# Patient Record
Sex: Female | Born: 1985 | Hispanic: Yes | Marital: Married | Smoking: Current every day smoker
Health system: Southern US, Community
[De-identification: ages and names within clinical notes are randomized; demographics above are authoritative.]

## PROBLEM LIST (undated history)

## (undated) ENCOUNTER — Inpatient Hospital Stay (HOSPITAL_COMMUNITY): Payer: Self-pay

## (undated) DIAGNOSIS — K760 Fatty (change of) liver, not elsewhere classified: Secondary | ICD-10-CM

## (undated) DIAGNOSIS — M25551 Pain in right hip: Secondary | ICD-10-CM

## (undated) DIAGNOSIS — K649 Unspecified hemorrhoids: Secondary | ICD-10-CM

## (undated) DIAGNOSIS — K219 Gastro-esophageal reflux disease without esophagitis: Secondary | ICD-10-CM

## (undated) DIAGNOSIS — N390 Urinary tract infection, site not specified: Secondary | ICD-10-CM

## (undated) DIAGNOSIS — F319 Bipolar disorder, unspecified: Secondary | ICD-10-CM

## (undated) DIAGNOSIS — I10 Essential (primary) hypertension: Secondary | ICD-10-CM

## (undated) DIAGNOSIS — J45909 Unspecified asthma, uncomplicated: Secondary | ICD-10-CM

## (undated) DIAGNOSIS — E669 Obesity, unspecified: Secondary | ICD-10-CM

## (undated) DIAGNOSIS — F4310 Post-traumatic stress disorder, unspecified: Secondary | ICD-10-CM

## (undated) DIAGNOSIS — Z720 Tobacco use: Secondary | ICD-10-CM

## (undated) DIAGNOSIS — R7303 Prediabetes: Secondary | ICD-10-CM

## (undated) DIAGNOSIS — R600 Localized edema: Secondary | ICD-10-CM

## (undated) DIAGNOSIS — G8929 Other chronic pain: Secondary | ICD-10-CM

## (undated) DIAGNOSIS — G4733 Obstructive sleep apnea (adult) (pediatric): Secondary | ICD-10-CM

## (undated) DIAGNOSIS — R002 Palpitations: Secondary | ICD-10-CM

## (undated) DIAGNOSIS — E785 Hyperlipidemia, unspecified: Secondary | ICD-10-CM

## (undated) DIAGNOSIS — F429 Obsessive-compulsive disorder, unspecified: Secondary | ICD-10-CM

## (undated) DIAGNOSIS — E559 Vitamin D deficiency, unspecified: Secondary | ICD-10-CM

## (undated) HISTORY — PX: NO PAST SURGERIES: SHX2092

## (undated) HISTORY — DX: Obsessive-compulsive disorder, unspecified: F42.9

## (undated) HISTORY — DX: Bipolar disorder, unspecified: F31.9

---

## 2004-02-06 ENCOUNTER — Inpatient Hospital Stay (HOSPITAL_COMMUNITY): Admission: AD | Admit: 2004-02-06 | Discharge: 2004-02-06 | Payer: Self-pay | Admitting: Family Medicine

## 2004-02-11 ENCOUNTER — Inpatient Hospital Stay (HOSPITAL_COMMUNITY): Admission: AD | Admit: 2004-02-11 | Discharge: 2004-02-11 | Payer: Self-pay | Admitting: *Deleted

## 2004-02-19 ENCOUNTER — Inpatient Hospital Stay (HOSPITAL_COMMUNITY): Admission: AD | Admit: 2004-02-19 | Discharge: 2004-02-27 | Payer: Self-pay | Admitting: Obstetrics & Gynecology

## 2004-03-22 ENCOUNTER — Emergency Department (HOSPITAL_COMMUNITY): Admission: EM | Admit: 2004-03-22 | Discharge: 2004-03-23 | Payer: Self-pay

## 2004-04-09 ENCOUNTER — Emergency Department (HOSPITAL_COMMUNITY): Admission: EM | Admit: 2004-04-09 | Discharge: 2004-04-09 | Payer: Self-pay

## 2004-09-24 ENCOUNTER — Emergency Department (HOSPITAL_COMMUNITY): Admission: EM | Admit: 2004-09-24 | Discharge: 2004-09-25 | Payer: Self-pay | Admitting: Emergency Medicine

## 2004-10-17 ENCOUNTER — Emergency Department (HOSPITAL_COMMUNITY): Admission: EM | Admit: 2004-10-17 | Discharge: 2004-10-17 | Payer: Self-pay | Admitting: Emergency Medicine

## 2004-11-05 ENCOUNTER — Emergency Department (HOSPITAL_COMMUNITY): Admission: EM | Admit: 2004-11-05 | Discharge: 2004-11-05 | Payer: Self-pay | Admitting: *Deleted

## 2005-02-11 ENCOUNTER — Emergency Department (HOSPITAL_COMMUNITY): Admission: EM | Admit: 2005-02-11 | Discharge: 2005-02-11 | Payer: Self-pay | Admitting: Emergency Medicine

## 2005-03-13 ENCOUNTER — Emergency Department (HOSPITAL_COMMUNITY): Admission: EM | Admit: 2005-03-13 | Discharge: 2005-03-13 | Payer: Self-pay | Admitting: Emergency Medicine

## 2005-03-18 ENCOUNTER — Emergency Department (HOSPITAL_COMMUNITY): Admission: EM | Admit: 2005-03-18 | Discharge: 2005-03-18 | Payer: Self-pay | Admitting: Emergency Medicine

## 2007-05-02 ENCOUNTER — Inpatient Hospital Stay (HOSPITAL_COMMUNITY): Admission: AD | Admit: 2007-05-02 | Discharge: 2007-05-02 | Payer: Self-pay | Admitting: Gynecology

## 2007-05-02 ENCOUNTER — Ambulatory Visit: Payer: Self-pay | Admitting: Obstetrics and Gynecology

## 2008-08-06 ENCOUNTER — Emergency Department (HOSPITAL_COMMUNITY): Admission: EM | Admit: 2008-08-06 | Discharge: 2008-08-07 | Payer: Self-pay | Admitting: Emergency Medicine

## 2010-07-28 ENCOUNTER — Emergency Department (HOSPITAL_COMMUNITY)
Admission: EM | Admit: 2010-07-28 | Discharge: 2010-07-28 | Payer: Self-pay | Source: Home / Self Care | Admitting: Emergency Medicine

## 2010-10-29 LAB — CBC
HCT: 37.6 % (ref 36.0–46.0)
Platelets: 225 10*3/uL (ref 150–400)
RDW: 12.6 % (ref 11.5–15.5)
WBC: 5.7 10*3/uL (ref 4.0–10.5)

## 2010-10-29 LAB — URINALYSIS, ROUTINE W REFLEX MICROSCOPIC
Hgb urine dipstick: NEGATIVE
Nitrite: NEGATIVE
Specific Gravity, Urine: 1.024 (ref 1.005–1.030)
Urobilinogen, UA: 0.2 mg/dL (ref 0.0–1.0)

## 2010-10-29 LAB — COMPREHENSIVE METABOLIC PANEL
ALT: 21 U/L (ref 0–35)
Albumin: 3.8 g/dL (ref 3.5–5.2)
Alkaline Phosphatase: 45 U/L (ref 39–117)
GFR calc Af Amer: >60 mL/min (ref >60)
Potassium: 4 mEq/L (ref 3.5–5.1)
Sodium: 137 mEq/L (ref 135–145)
Total Protein: 7.1 g/dL (ref 6.0–8.3)

## 2010-10-29 LAB — DIFFERENTIAL
Basophils Relative: 1 % (ref 0–1)
Eosinophils Absolute: 0.2 10*3/uL (ref 0.0–0.7)
Monocytes Absolute: 0.5 10*3/uL (ref 0.1–1.0)
Monocytes Relative: 9 % (ref 3–12)

## 2010-10-29 LAB — HEMOCCULT GUIAC POC 1CARD (OFFICE): Fecal Occult Bld: NEGATIVE

## 2011-01-04 NOTE — Discharge Summary (Signed)
NAME:  Jenny Baldwin, Jenny Baldwin                           ACCOUNT NO.:  [id number]   MEDICAL RECORD NO.:  [id number]                   PATIENT TYPE:  INP   LOCATION:  9111                                 FACILITY:  [facility]   PHYSICIAN:  Lesly Dukes, M.D.              DATE OF BIRTH:  11-Apr-1986   DATE OF ADMISSION:  02/19/2004  DATE OF DISCHARGE:                                 DISCHARGE SUMMARY   ADMISSION DIAGNOSES:  1. A 25 year old gravida 4 para 1-1-1-2 at 56 and 4 with right     costovertebral angle pain and fever.  2. Reassuring fetal heart tracing.  3. Recent admission 1 month prior with enterococcus cystitis with     noncompliant antibiotic course.   DISCHARGE DIAGNOSES:  1. A 25 year old gravida 4 para 2-1-1-3 postpartum day #2 status post     vaginal delivery.  2. Pyelonephritis, controlled.  3. Viable female with Apgars 5 at one minute and 9 at five minutes.   DISCHARGE MEDICATIONS:  1. Ibuprofen 600 mg p.o. q.6h. p.r.n. #30.  2. Levaquin 500 mg one p.o. q.a.m. at 8 a.m. x6 days.  3. Micronor one p.o. daily as directed, p.r.n. refills.  4. Prenatal vitamin one p.o. daily p.r.n. refills.   ADMISSION HISTORY:  Jenny Baldwin is a 25 year old who presented at 27 and 4  weeks with right CVA pain and fever.  She was admitted for suspected right  pyelonephritis.   HOSPITAL COURSE:  #1 - PYELONEPHRITIS.  The patient was admitted and placed  on IV Rocephin.  She continued to spike fevers for the next 2 days.  Her  urine culture grew enterococcus and she was changed to vancomycin.  She  quickly defervesced on vancomycin.  Urine culture sensitivities were to  Levaquin, vancomycin, ampicillin, and Macrobid.  The patient continued with  IV vancomycin due to the fact that her noncompliance was concerning.  Of  note, she was seen a month earlier and diagnosed with cystitis.  That  culture grew out enterococcus and the patient never filled her prescription  of antibiotics due to financial  reasons.  The patient remained afebrile on  vancomycin.  On the day of discharge the patient was transitioned to  Levaquin.  The plan is for neonatal protection, to take Levaquin at 8 a.m.  in the morning.  Pump throughout the rest of the day and discard the milk.  Breast feeding can then resume around 11 p.m. and then throughout the night  until the following morning when the cycle should be repeated.  This should  persist for 6 days until the antibiotics are completed.  This was discussed  with both lactation and pharmacy and both felt that that was reasonable.  The patient understands and agrees to be compliant.   #2 - SPONTANEOUS VAGINAL DELIVERY.  It was decided after a 6-day course of  vancomycin that the patient, being of full-term gestation,  be induced.  The  induction was begun with Cytotec.  The patient became favorable and was  begun on Pitocin.  Her labor was uncomplicated.  She delivered a viable girl  by spontaneous vaginal delivery with Apgars 5 at one minute and 9 at five  minutes.  She then had an uncomplicated postpartum course from the aspect of  her delivery.  Her pain was well controlled with ibuprofen.  She plans to  use Micronor for birth control and does plan to breast feed.   CONDITION ON DISCHARGE:  The patient was discharged to home in stable  condition.   INSTRUCTIONS GIVEN TO PATIENT:  The patient was told of the above medical  regimen and also the breastfeeding plan as stated above.  She is to follow  up at [facility] in 6 weeks and should return if she has fevers,  dysuria, or CVA tenderness.     Jon Gills, M.D.                     Lesly Dukes, M.D.    LC/MEDQ  D:  02/27/2004  T:  02/27/2004  Job:  [id number]

## 2011-01-08 ENCOUNTER — Emergency Department (HOSPITAL_COMMUNITY)
Admission: EM | Admit: 2011-01-08 | Discharge: 2011-01-08 | Discharge status: Against Medical Advice | Payer: Self-pay | Attending: Emergency Medicine | Admitting: Emergency Medicine

## 2011-01-08 ENCOUNTER — Emergency Department (HOSPITAL_COMMUNITY): Payer: Self-pay

## 2011-01-08 ENCOUNTER — Emergency Department (HOSPITAL_COMMUNITY)
Admission: EM | Admit: 2011-01-08 | Discharge: 2011-01-08 | Disposition: A | Discharge status: Home / Self Care | Payer: Self-pay | Attending: Emergency Medicine | Admitting: Emergency Medicine

## 2011-01-08 DIAGNOSIS — R300 Dysuria: Secondary | ICD-10-CM | POA: Insufficient documentation

## 2011-01-08 DIAGNOSIS — M545 Low back pain, unspecified: Secondary | ICD-10-CM | POA: Insufficient documentation

## 2011-01-08 DIAGNOSIS — M5430 Sciatica, unspecified side: Secondary | ICD-10-CM | POA: Insufficient documentation

## 2011-01-08 DIAGNOSIS — M79609 Pain in unspecified limb: Secondary | ICD-10-CM | POA: Insufficient documentation

## 2011-01-08 DIAGNOSIS — M25559 Pain in unspecified hip: Secondary | ICD-10-CM | POA: Insufficient documentation

## 2011-01-08 LAB — URINE MICROSCOPIC-ADD ON

## 2011-01-08 LAB — URINALYSIS, ROUTINE W REFLEX MICROSCOPIC
Bilirubin Urine: NEGATIVE
Hgb urine dipstick: NEGATIVE
Nitrite: NEGATIVE
Specific Gravity, Urine: 1.029 (ref 1.005–1.030)
pH: 7 (ref 5.0–8.0)

## 2011-01-08 LAB — POCT PREGNANCY, URINE: Preg Test, Ur: NEGATIVE

## 2011-01-09 LAB — URINE CULTURE: Colony Count: 15000

## 2011-04-13 ENCOUNTER — Emergency Department (HOSPITAL_COMMUNITY)
Admission: EM | Admit: 2011-04-13 | Discharge: 2011-04-13 | Disposition: A | Discharge status: Home / Self Care | Payer: Self-pay | Attending: Emergency Medicine | Admitting: Emergency Medicine

## 2011-04-13 DIAGNOSIS — M25569 Pain in unspecified knee: Secondary | ICD-10-CM | POA: Insufficient documentation

## 2011-05-24 LAB — RAPID URINE DRUG SCREEN, HOSP PERFORMED
Amphetamines: NOT DETECTED
Benzodiazepines: NOT DETECTED
Cocaine: NOT DETECTED
Opiates: NOT DETECTED
Tetrahydrocannabinol: NOT DETECTED

## 2011-05-24 LAB — URINALYSIS, ROUTINE W REFLEX MICROSCOPIC
Bilirubin Urine: NEGATIVE
Ketones, ur: NEGATIVE mg/dL
Nitrite: NEGATIVE
Protein, ur: NEGATIVE mg/dL
Urobilinogen, UA: 0.2 mg/dL (ref 0.0–1.0)

## 2011-05-24 LAB — BASIC METABOLIC PANEL
CO2: 19 mEq/L (ref 19–32)
Calcium: 8.7 mg/dL (ref 8.4–10.5)
GFR calc Af Amer: >60 mL/min (ref >60)
Glucose, Bld: 98 mg/dL (ref 70–99)
Potassium: 2.8 mEq/L — ABNORMAL LOW (ref 3.5–5.1)
Sodium: 138 mEq/L (ref 135–145)

## 2011-05-24 LAB — CBC
HCT: 42.6 % (ref 36.0–46.0)
Hemoglobin: 14.2 g/dL (ref 12.0–15.0)
MCHC: 33.4 g/dL (ref 30.0–36.0)
RBC: 4.36 MIL/uL (ref 3.87–5.11)
RDW: 12 % (ref 11.5–15.5)

## 2011-05-24 LAB — DIFFERENTIAL
Basophils Relative: 1 % (ref 0–1)
Eosinophils Relative: 3 % (ref 0–5)
Lymphocytes Relative: 36 % (ref 12–46)
Monocytes Absolute: 0.6 10*3/uL (ref 0.1–1.0)
Monocytes Relative: 6 % (ref 3–12)
Neutro Abs: 5.4 10*3/uL (ref 1.7–7.7)

## 2011-05-24 LAB — URINE MICROSCOPIC-ADD ON

## 2011-05-31 LAB — RAPID URINE DRUG SCREEN, HOSP PERFORMED
Benzodiazepines: NOT DETECTED
Cocaine: NOT DETECTED
Tetrahydrocannabinol: NOT DETECTED

## 2011-05-31 LAB — URINALYSIS, ROUTINE W REFLEX MICROSCOPIC
Ketones, ur: NEGATIVE
Nitrite: NEGATIVE
Protein, ur: NEGATIVE

## 2011-09-04 ENCOUNTER — Inpatient Hospital Stay (HOSPITAL_COMMUNITY): Payer: Medicaid Other

## 2011-09-04 ENCOUNTER — Inpatient Hospital Stay (HOSPITAL_COMMUNITY)
Admission: AD | Admit: 2011-09-04 | Discharge: 2011-09-04 | Disposition: A | Discharge status: Home / Self Care | Payer: Medicaid Other | Source: Ambulatory Visit | Attending: Obstetrics & Gynecology | Admitting: Obstetrics & Gynecology

## 2011-09-04 ENCOUNTER — Encounter (HOSPITAL_COMMUNITY): Payer: Self-pay | Admitting: *Deleted

## 2011-09-04 DIAGNOSIS — N949 Unspecified condition associated with female genital organs and menstrual cycle: Secondary | ICD-10-CM

## 2011-09-04 DIAGNOSIS — O26899 Other specified pregnancy related conditions, unspecified trimester: Secondary | ICD-10-CM

## 2011-09-04 DIAGNOSIS — R109 Unspecified abdominal pain: Secondary | ICD-10-CM

## 2011-09-04 DIAGNOSIS — O99891 Other specified diseases and conditions complicating pregnancy: Secondary | ICD-10-CM | POA: Insufficient documentation

## 2011-09-04 DIAGNOSIS — R1031 Right lower quadrant pain: Secondary | ICD-10-CM | POA: Insufficient documentation

## 2011-09-04 HISTORY — DX: Gastro-esophageal reflux disease without esophagitis: K21.9

## 2011-09-04 LAB — CBC
MCH: 32 pg (ref 26.0–34.0)
MCHC: 34.6 g/dL (ref 30.0–36.0)
MCV: 92.3 fL (ref 78.0–100.0)
Platelets: 254 10*3/uL (ref 150–400)
RBC: 4.13 MIL/uL (ref 3.87–5.11)
RDW: 12.4 % (ref 11.5–15.5)

## 2011-09-04 LAB — WET PREP, GENITAL: Clue Cells Wet Prep HPF POC: NONE SEEN

## 2011-09-04 LAB — URINE MICROSCOPIC-ADD ON

## 2011-09-04 LAB — URINALYSIS, ROUTINE W REFLEX MICROSCOPIC
Bilirubin Urine: NEGATIVE
Ketones, ur: NEGATIVE mg/dL
Nitrite: NEGATIVE
Protein, ur: NEGATIVE mg/dL
Urobilinogen, UA: 0.2 mg/dL (ref 0.0–1.0)

## 2011-09-04 NOTE — ED Provider Notes (Signed)
History   Pt presents today c/o RLQ pain that began earlier today. She states she had a positive home pregnancy test and she wants to make sure everything is "ok." She denies vag dc, bleeding, fever, or any other sx at this time. She states this is a planned pregnancy.  Chief Complaint  Patient presents with  . Abdominal Cramping   HPI  OB History    Grav Para Term Preterm Abortions TAB SAB Ect Mult Living   8 5 4 1 2  0 2 0 0 5      Past Medical History  Diagnosis Date  . Acid reflux     History reviewed. No pertinent past surgical history.  Family History  Problem Relation Age of Onset  . Anesthesia problems Neg Hx     History  Substance Use Topics  . Smoking status: Current Everyday Smoker -- 0.2 packs/day  . Smokeless tobacco: Never Used  . Alcohol Use: No    Allergies:  Allergies  Allergen Reactions  . Latex Itching  . Penicillins Swelling    Prescriptions prior to admission  Medication Sig Dispense Refill  . Prenatal Vit-Fe Fumarate-FA (PRENATAL MULTIVITAMIN) TABS Take 1 tablet by mouth daily. Has not picked them up yet, but plans on getting them as soon as she leaves today        Review of Systems  Constitutional: Negative for fever.  Eyes: Negative for blurred vision and double vision.  Cardiovascular: Negative for chest pain.  Gastrointestinal: Positive for abdominal pain. Negative for nausea, vomiting, diarrhea and constipation.  Genitourinary: Negative for dysuria, urgency, frequency and hematuria.  Neurological: Negative for dizziness and headaches.  Psychiatric/Behavioral: Negative for depression and suicidal ideas.   Physical Exam   Blood pressure 115/65, pulse 70, temperature 98.5 F (36.9 C), resp. rate 20, height 5\' 3"  (1.6 m), weight 160 lb (72.576 kg), last menstrual period 08/05/2011.  Physical Exam  Nursing note and vitals reviewed. Constitutional: She is oriented to person, place, and time. She appears well-developed and  well-nourished. No distress.  HENT:  Head: Normocephalic and atraumatic.  Eyes: EOM are normal. Pupils are equal, round, and reactive to light.  GI: Soft. She exhibits no distension. There is no tenderness. There is no rebound and no guarding.  Genitourinary: No bleeding around the vagina. Vaginal discharge found.       Cervix Lg/closed. Thin, greenish vag dc present. Uterus top-normal size. No adnexal masses.  Neurological: She is alert and oriented to person, place, and time.  Skin: Skin is warm and dry. She is not diaphoretic.  Psychiatric: She has a normal mood and affect. Her behavior is normal. Judgment and thought content normal.    MAU Course  Procedures  Wet prep and GC/Chlamydia cultures done.  Results for orders placed during the hospital encounter of 09/04/11 (from the past 24 hour(s))  URINALYSIS, ROUTINE W REFLEX MICROSCOPIC     Status: Abnormal   Collection Time   09/04/11  1:15 PM      Component Value Range   Color, Urine YELLOW  YELLOW    APPearance HAZY (*) CLEAR    Specific Gravity, Urine >1.030 (*) 1.005 - 1.030    pH 6.0  5.0 - 8.0    Glucose, UA NEGATIVE  NEGATIVE (mg/dL)   Hgb urine dipstick NEGATIVE  NEGATIVE    Bilirubin Urine NEGATIVE  NEGATIVE    Ketones, ur NEGATIVE  NEGATIVE (mg/dL)   Protein, ur NEGATIVE  NEGATIVE (mg/dL)   Urobilinogen, UA 0.2  0.0 - 1.0 (mg/dL)   Nitrite NEGATIVE  NEGATIVE    Leukocytes, UA SMALL (*) NEGATIVE   URINE MICROSCOPIC-ADD ON     Status: Abnormal   Collection Time   09/04/11  1:15 PM      Component Value Range   Squamous Epithelial / LPF MANY (*) RARE    WBC, UA 0-2  <3 (WBC/hpf)   Bacteria, UA FEW (*) RARE    Urine-Other MUCOUS PRESENT    POCT PREGNANCY, URINE     Status: Normal   Collection Time   09/04/11  1:23 PM      Component Value Range   Preg Test, Ur POSITIVE    CBC     Status: Normal   Collection Time   09/04/11  1:35 PM      Component Value Range   WBC 7.3  4.0 - 10.5 (K/uL)   RBC 4.13  3.87 - 5.11  (MIL/uL)   Hemoglobin 13.2  12.0 - 15.0 (g/dL)   HCT 04.5  40.9 - 81.1 (%)   MCV 92.3  78.0 - 100.0 (fL)   MCH 32.0  26.0 - 34.0 (pg)   MCHC 34.6  30.0 - 36.0 (g/dL)   RDW 91.4  78.2 - 95.6 (%)   Platelets 254  150 - 400 (K/uL)  HCG, QUANTITATIVE, PREGNANCY     Status: Abnormal   Collection Time   09/04/11  1:45 PM      Component Value Range   hCG, Beta Chain, Quant, S 759 (*) <5 (mIU/mL)  WET PREP, GENITAL     Status: Abnormal   Collection Time   09/04/11  1:49 PM      Component Value Range   Yeast, Wet Prep NONE SEEN  NONE SEEN    Trich, Wet Prep NONE SEEN  NONE SEEN    Clue Cells, Wet Prep NONE SEEN  NONE SEEN    WBC, Wet Prep HPF POC FEW (*) NONE SEEN    US shows small amount of free fluid. No IUP or adnexal masses noted. Assessment and Plan  Pain in preg: discussed with pt at length. She will return in 2 days for repeat B-quant. Discussed signs and sx of ectopic preg. Discussed diet, activity, risks, and precautions.  Clinton Gallant. Jeanne Diefendorf III, DrHSc, MPAS, PA-C  09/04/2011, 1:53 PM   Henrietta Hoover, PA 09/04/11 1507

## 2011-09-04 NOTE — Progress Notes (Signed)
Cramping started this Am, a little more on RLQ. No bleeding or change in vaginal discharge. Had + HPT X 2.

## 2011-09-04 NOTE — Progress Notes (Signed)
+  HPT x2, now cramping in RLQ.  Denies urinary s/s.

## 2011-09-04 NOTE — Progress Notes (Signed)
LMP 08/05/11

## 2011-09-05 LAB — GC/CHLAMYDIA PROBE AMP, GENITAL: Chlamydia, DNA Probe: NEGATIVE

## 2011-09-06 ENCOUNTER — Inpatient Hospital Stay (HOSPITAL_COMMUNITY)
Admission: AD | Admit: 2011-09-06 | Discharge: 2011-09-06 | Disposition: A | Discharge status: Home / Self Care | Payer: Self-pay | Source: Ambulatory Visit | Attending: Obstetrics & Gynecology | Admitting: Obstetrics & Gynecology

## 2011-09-06 DIAGNOSIS — O99891 Other specified diseases and conditions complicating pregnancy: Secondary | ICD-10-CM | POA: Insufficient documentation

## 2011-09-06 DIAGNOSIS — R102 Pelvic and perineal pain: Secondary | ICD-10-CM

## 2011-09-06 DIAGNOSIS — R1031 Right lower quadrant pain: Secondary | ICD-10-CM | POA: Insufficient documentation

## 2011-09-06 NOTE — ED Provider Notes (Signed)
History     Chief Complaint  Patient presents with  . Follow-up   HPI 26 y.o. [id number] at [redacted]w[redacted]d here for f/u quant. Seen in MAU on 1/16 with RLQ pain, HCG 759, no adnexal mass or IUP on u/s. Today pain continues, not severe, no bleeding.    Past Medical History  Diagnosis Date  . Acid reflux     No past surgical history on file.  Family History  Problem Relation Age of Onset  . Anesthesia problems Neg Hx     History  Substance Use Topics  . Smoking status: Current Everyday Smoker -- 0.2 packs/day  . Smokeless tobacco: Never Used  . Alcohol Use: No    Allergies:  Allergies  Allergen Reactions  . Latex Itching  . Penicillins Swelling    Prescriptions prior to admission  Medication Sig Dispense Refill  . Prenatal Vit-Fe Fumarate-FA (PRENATAL MULTIVITAMIN) TABS Take 1 tablet by mouth daily. Has not picked them up yet, but plans on getting them as soon as she leaves today        Review of Systems  Constitutional: Negative.   Respiratory: Negative.   Cardiovascular: Negative.   Gastrointestinal: Positive for abdominal pain. Negative for nausea, vomiting, diarrhea and constipation.  Genitourinary: Negative for dysuria, urgency, frequency, hematuria and flank pain.       Negative for vaginal bleeding, vaginal discharge  Musculoskeletal: Negative.   Neurological: Negative.   Psychiatric/Behavioral: Negative.    Physical Exam   Blood pressure 111/74, pulse 82, temperature 98.7 F (37.1 C), temperature source Oral, resp. rate 20, height 5' 2.5" (1.588 m), weight 160 lb 5 oz (72.717 kg), last menstrual period 08/05/2011, SpO2 98.00%.  Physical Exam  Nursing note and vitals reviewed. Constitutional: She is oriented to person, place, and time. She appears well-developed and well-nourished. No distress.  Cardiovascular: Normal rate.   Respiratory: Effort normal.  Musculoskeletal: Normal range of motion.  Neurological: She is alert and oriented to person, place, and  time.  Psychiatric: She has a normal mood and affect.    MAU Course  Procedures  Results for orders placed during the hospital encounter of 09/06/11 (from the past 24 hour(s))  HCG, QUANTITATIVE, PREGNANCY     Status: Abnormal   Collection Time   09/06/11  2:20 PM      Component Value Range   hCG, Beta Chain, Quant, S 1715 (*) <5 (mIU/mL)     Assessment and Plan  25 y.o. [id number] at [redacted]w[redacted]d with RLQ pain Appropriate rise in HCG Return 1 week for repeat u/s Precautions rev'd  FRAZIER,NATALIE 09/06/2011, 3:18 PM

## 2011-09-06 NOTE — Progress Notes (Signed)
Pt states " I'm here for a bhcg test today. I am still having pain in my right lower abdomen, but no bleeding."

## 2011-09-13 ENCOUNTER — Inpatient Hospital Stay (HOSPITAL_COMMUNITY)
Admission: AD | Admit: 2011-09-13 | Discharge: 2011-09-13 | Disposition: A | Discharge status: Home / Self Care | Payer: Self-pay | Source: Ambulatory Visit | Attending: Family Medicine | Admitting: Family Medicine

## 2011-09-13 ENCOUNTER — Inpatient Hospital Stay (HOSPITAL_COMMUNITY): Payer: Self-pay

## 2011-09-13 DIAGNOSIS — O99891 Other specified diseases and conditions complicating pregnancy: Secondary | ICD-10-CM | POA: Insufficient documentation

## 2011-09-13 DIAGNOSIS — R109 Unspecified abdominal pain: Secondary | ICD-10-CM | POA: Insufficient documentation

## 2011-09-13 DIAGNOSIS — O26899 Other specified pregnancy related conditions, unspecified trimester: Secondary | ICD-10-CM

## 2011-09-13 NOTE — ED Provider Notes (Signed)
Chart reviewed and agree with management and plan.  

## 2011-09-13 NOTE — ED Provider Notes (Signed)
History   Pt presents today for fetal viability [place]. She was originally seen for pain in pregnancy. She states she continues to have pain that comes and goes. She denies vag bleeding, fever, or any other problems at this time.  Chief Complaint  Patient presents with  . Follow-up   HPI  OB History    Grav Para Term Preterm Abortions TAB SAB Ect Mult Living   8 5 4 1 2  0 2 0 0 5      Past Medical History  Diagnosis Date  . Acid reflux     No past surgical history on file.  Family History  Problem Relation Age of Onset  . Anesthesia problems Neg Hx     History  Substance Use Topics  . Smoking status: Current Everyday Smoker -- 0.2 packs/day  . Smokeless tobacco: Never Used  . Alcohol Use: No    Allergies:  Allergies  Allergen Reactions  . Latex Itching  . Penicillins Swelling    Prescriptions prior to admission  Medication Sig Dispense Refill  . Prenatal Vit-Fe Fumarate-FA (PRENATAL MULTIVITAMIN) TABS Take 1 tablet by mouth daily. Has not picked them up yet, but plans on getting them as soon as she leaves today        Review of Systems  Constitutional: Negative for fever.  Eyes: Negative for blurred vision and double vision.  Cardiovascular: Negative for chest pain and palpitations.  Gastrointestinal: Positive for abdominal pain. Negative for nausea, vomiting, diarrhea, constipation and blood in stool.  Genitourinary: Negative for dysuria, urgency, frequency and hematuria.  Neurological: Negative for dizziness and headaches.  Psychiatric/Behavioral: Negative for depression and suicidal ideas.   Physical Exam   Blood pressure 123/80, pulse 86, temperature 98.5 F (36.9 C), temperature source Oral, resp. rate 20, height 5\' 2"  (1.575 m), weight 161 lb (73.029 kg), last menstrual period 08/05/2011, SpO2 98.00%.  Physical Exam  Nursing note and vitals reviewed. Constitutional: She is oriented to person, place, and time. She appears well-developed and  well-nourished. No distress.  HENT:  Head: Normocephalic and atraumatic.  Eyes: EOM are normal. Pupils are equal, round, and reactive to light.  GI: Soft. She exhibits no distension. There is no tenderness. There is no rebound and no guarding.  Neurological: She is alert and oriented to person, place, and time.  Skin: Skin is warm and dry. She is not diaphoretic.  Psychiatric: She has a normal mood and affect. Her behavior is normal. Judgment and thought content normal.    MAU Course  Procedures  US Ob Transvaginal  09/13/2011  *RADIOLOGY REPORT*  Clinical Data: Followup to determine if there is an intrauterine gestation.  TRANSVAGINAL OB ULTRASOUND  Technique:  Transvaginal ultrasound was performed for evaluation of the gestation as well as the maternal uterus and adnexal regions.  Comparison: 09/04/2011.  Findings:  Gestational sac with mean sac diameter of 1.27 cm consistent with the 6-week and 0-day gestational age and an estimated date of confinement of 05/08/2012.  A small yolk sac is noted consistent with intrauterine gestation. Fetal pole is not visualized possibly related to the early gestational age and can be assessed on follow-up.  Right-sided corpus luteum cyst measures up to 2 cm.  No free fluid.  IMPRESSION: Intrauterine gestation as indicated by the presence of a intrauterine gestational sac.  Fetal pole not visualized possibly because of early gestational age.  Please see above.  Original Report Authenticated By: Fuller Canada, M.D.     Assessment and Plan  IUP: discussed with pt at length. She is to begin prenatal care. Discussed diet, activity, risks, and precautions.  Clinton Gallant. Jayd Forrey III, DrHSc, MPAS, PA-C  09/13/2011, 4:39 PM   Henrietta Hoover, PA 09/13/11 1642

## 2011-09-13 NOTE — Progress Notes (Signed)
Pt states, "I'm here for a follow up U/S because they didn't see the baby before. I am still having crampy shooting pain in my right lower abdomen."

## 2011-11-25 ENCOUNTER — Inpatient Hospital Stay (HOSPITAL_COMMUNITY)
Admission: AD | Admit: 2011-11-25 | Discharge: 2011-11-25 | Disposition: A | Discharge status: Hospice | Payer: Medicaid Other | Source: Ambulatory Visit | Attending: Obstetrics and Gynecology | Admitting: Obstetrics and Gynecology

## 2011-11-25 ENCOUNTER — Encounter (HOSPITAL_COMMUNITY): Payer: Self-pay | Admitting: *Deleted

## 2011-11-25 DIAGNOSIS — R109 Unspecified abdominal pain: Secondary | ICD-10-CM | POA: Insufficient documentation

## 2011-11-25 DIAGNOSIS — R300 Dysuria: Secondary | ICD-10-CM | POA: Insufficient documentation

## 2011-11-25 DIAGNOSIS — N949 Unspecified condition associated with female genital organs and menstrual cycle: Secondary | ICD-10-CM

## 2011-11-25 LAB — URINALYSIS, ROUTINE W REFLEX MICROSCOPIC
Leukocytes, UA: NEGATIVE
Nitrite: NEGATIVE
Specific Gravity, Urine: <1.005 — ABNORMAL LOW (ref 1.005–1.030)
Urobilinogen, UA: 0.2 mg/dL (ref 0.0–1.0)
pH: 5.5 (ref 5.0–8.0)

## 2011-11-25 LAB — WET PREP, GENITAL: Yeast Wet Prep HPF POC: NONE SEEN

## 2011-11-25 NOTE — MAU Provider Note (Signed)
History     CSN: [id number]  Arrival date and time: 11/25/11 4098   First Provider Initiated Contact with Patient 11/25/11 1917      No chief complaint on file.  HPI 26 y.o. [id number] at [redacted]w[redacted]d c/o pressure/pain in low abdomen and with urination. Pain worse with movement and position changes. No vaginal bleeding, + discharge. Also c/o sore in vaginal area, thinks she cut herself shaving.    Past Medical History  Diagnosis Date  . Acid reflux     History reviewed. No pertinent past surgical history.  Family History  Problem Relation Age of Onset  . Anesthesia problems Neg Hx   . Diabetes Mother   . Diabetes Maternal Aunt   . Diabetes Maternal Uncle     History  Substance Use Topics  . Smoking status: Former Smoker -- 1.0 packs/day for 14 years    Types: Cigarettes    Quit date: 11/08/2011  . Smokeless tobacco: Never Used  . Alcohol Use: No    Allergies:  Allergies  Allergen Reactions  . Latex Itching  . Penicillins Swelling    Prescriptions prior to admission  Medication Sig Dispense Refill  . acetaminophen (TYLENOL) 500 MG tablet Take 500 mg by mouth every 6 (six) hours as needed. For tooth ache      . Prenatal Vit-Fe Fumarate-FA (PRENATAL MULTIVITAMIN) TABS Take 1 tablet by mouth daily. Has not picked them up yet, but plans on getting them as soon as she leaves today        Review of Systems  Constitutional: Negative.   Respiratory: Negative.   Cardiovascular: Negative.   Gastrointestinal: Positive for abdominal pain. Negative for nausea, vomiting, diarrhea and constipation.  Genitourinary: Negative for dysuria, urgency, frequency, hematuria and flank pain.       Negative for vaginal bleeding, cramping/contractions  Musculoskeletal: Negative.   Neurological: Negative.   Psychiatric/Behavioral: Negative.    Physical Exam   Last menstrual period 08/05/2011.  Physical Exam  Nursing note and vitals reviewed. Constitutional: She is oriented to person,  place, and time. She appears well-developed and well-nourished. No distress.  HENT:  Head: Normocephalic and atraumatic.  Cardiovascular: Normal rate.   Respiratory: Effort normal.  GI: Soft. Bowel sounds are normal. She exhibits no mass. There is no tenderness. There is no rebound and no guarding.  Genitourinary:    There is lesion on the right labia. There is no rash on the right labia. There is no rash or lesion on the left labia. Uterus is not tender. Enlarged: Size c/w dates. Cervix exhibits no motion tenderness, no discharge and no friability. Right adnexum displays no mass, no tenderness and no fullness. Left adnexum displays no mass, no tenderness and no fullness. No tenderness or bleeding around the vagina. No vaginal discharge found.  Musculoskeletal: Normal range of motion.  Neurological: She is alert and oriented to person, place, and time.  Skin: Skin is warm and dry.  Psychiatric: She has a normal mood and affect.    MAU Course  Procedures Results for orders placed during the hospital encounter of 11/25/11 (from the past 72 hour(s))  URINALYSIS, ROUTINE W REFLEX MICROSCOPIC     Status: Abnormal   Collection Time   11/25/11  7:00 PM      Component Value Range Comment   Color, Urine YELLOW  YELLOW     APPearance CLEAR  CLEAR     Specific Gravity, Urine <1.005 (*) 1.005 - 1.030     pH 5.5  5.0 -  8.0     Glucose, UA NEGATIVE  NEGATIVE (mg/dL)    Hgb urine dipstick NEGATIVE  NEGATIVE     Bilirubin Urine NEGATIVE  NEGATIVE     Ketones, ur NEGATIVE  NEGATIVE (mg/dL)    Protein, ur NEGATIVE  NEGATIVE (mg/dL)    Urobilinogen, UA 0.2  0.0 - 1.0 (mg/dL)    Nitrite NEGATIVE  NEGATIVE     Leukocytes, UA NEGATIVE  NEGATIVE  MICROSCOPIC NOT DONE ON URINES WITH NEGATIVE PROTEIN, BLOOD, LEUKOCYTES, NITRITE, OR GLUCOSE <1000 mg/dL.  GC/CHLAMYDIA PROBE AMP, GENITAL     Status: Normal   Collection Time   11/25/11  7:25 PM      Component Value Range Comment   GC Probe Amp, Genital  NEGATIVE  NEGATIVE     Chlamydia, DNA Probe NEGATIVE  NEGATIVE    WET PREP, GENITAL     Status: Abnormal   Collection Time   11/25/11  7:25 PM      Component Value Range Comment   Yeast Wet Prep HPF POC NONE SEEN  NONE SEEN     Trich, Wet Prep NONE SEEN  NONE SEEN     Clue Cells Wet Prep HPF POC NONE SEEN  NONE SEEN     WBC, Wet Prep HPF POC FEW (*) NONE SEEN  MANY BACTERIA SEEN  HERPES SIMPLEX VIRUS CULTURE     Status: Normal (Preliminary result)   Collection Time   11/25/11  7:25 PM      Component Value Range Comment   Specimen Description VAGINA      Special Requests NONE      Culture Culture has been initiated.      Report Status PENDING        Assessment and Plan  26 y.o. [id number] at [redacted]w[redacted]d  Low abd pain c/w round ligament pain - rev'd comfort measures and precautions HSV culture pending F/U for prenatal care as soon as possible - awaiting medicaid Emmerson Shuffield 11/25/2011, 7:57 PM

## 2011-11-25 NOTE — MAU Note (Signed)
C/o pain and pressure in low abd since yesterday and pressure and pain when she urinates.

## 2011-11-26 LAB — GC/CHLAMYDIA PROBE AMP, GENITAL: Chlamydia, DNA Probe: NEGATIVE

## 2011-11-27 ENCOUNTER — Encounter (HOSPITAL_COMMUNITY): Payer: Self-pay | Admitting: Advanced Practice Midwife

## 2011-11-27 LAB — HERPES SIMPLEX VIRUS CULTURE

## 2011-11-27 NOTE — MAU Provider Note (Signed)
Agree with above note.  Jenny Baldwin 11/27/2011 8:42 AM

## 2011-12-25 LAB — OB RESULTS CONSOLE RPR: RPR: NONREACTIVE

## 2011-12-25 LAB — OB RESULTS CONSOLE ANTIBODY SCREEN: Antibody Screen: NEGATIVE

## 2012-01-25 ENCOUNTER — Inpatient Hospital Stay (HOSPITAL_COMMUNITY)
Admission: AD | Admit: 2012-01-25 | Discharge: 2012-01-25 | Disposition: A | Discharge status: Home / Self Care | Payer: Medicaid Other | Source: Ambulatory Visit | Attending: Obstetrics & Gynecology | Admitting: Obstetrics & Gynecology

## 2012-01-25 ENCOUNTER — Encounter (HOSPITAL_COMMUNITY): Payer: Self-pay

## 2012-01-25 DIAGNOSIS — W19XXXA Unspecified fall, initial encounter: Secondary | ICD-10-CM

## 2012-01-25 DIAGNOSIS — R109 Unspecified abdominal pain: Secondary | ICD-10-CM | POA: Insufficient documentation

## 2012-01-25 DIAGNOSIS — Z3490 Encounter for supervision of normal pregnancy, unspecified, unspecified trimester: Secondary | ICD-10-CM

## 2012-01-25 DIAGNOSIS — O99891 Other specified diseases and conditions complicating pregnancy: Secondary | ICD-10-CM | POA: Insufficient documentation

## 2012-01-25 DIAGNOSIS — W010XXA Fall on same level from slipping, tripping and stumbling without subsequent striking against object, initial encounter: Secondary | ICD-10-CM | POA: Insufficient documentation

## 2012-01-25 LAB — URINALYSIS, ROUTINE W REFLEX MICROSCOPIC
Bilirubin Urine: NEGATIVE
Ketones, ur: NEGATIVE mg/dL
Leukocytes, UA: NEGATIVE
Nitrite: NEGATIVE
Specific Gravity, Urine: 1.02 (ref 1.005–1.030)
Urobilinogen, UA: 0.2 mg/dL (ref 0.0–1.0)
pH: 7.5 (ref 5.0–8.0)

## 2012-01-25 NOTE — MAU Note (Signed)
Patient is in with c/o sharp and cramping lower abdominal pain after slipping and falling face forward into the pool about an hour ago. She states that she was about to step into the pool when she slipped and fell. She denies any vaginal bleeding. She has no prenatal care. She reports good fetal movement.

## 2012-01-25 NOTE — Discharge Instructions (Signed)
Prenatal Care  WHAT IS PRENATAL CARE?  Prenatal care means health care during your pregnancy, before your baby is born. Take care of yourself and your baby by:   Getting early prenatal care. If you know you are pregnant, or think you might be pregnant, call your caregiver as soon as possible. Schedule a visit for a general/prenatal examination.   Getting regular prenatal care. Follow your caregiver's schedule for blood and other necessary tests. Do not miss appointments.   Do everything you can to keep yourself and your baby healthy during your pregnancy.   Prenatal care should include evaluation of medical, dietary, educational, psychological, and social needs for the couple and the medical, surgical, and genetic history of the family of the mother and father.   Discuss with your caregiver:   Your medicines, prescription, over-the-counter, and herbal medicines.   Substance abuse, alcohol, smoking, and illegal drugs.   Domestic abuse and violence, if present.   Your immunizations.   Nutrition and diet.   Exercising.   Environment and occupational hazards, at home and at work.   History of sexually transmitted disease, both you and your partner.   Previous pregnancies.  WHY IS PRENATAL CARE SO IMPORTANT?  By seeing you regularly, your caregiver has the chance to find problems early, so that they can be treated as soon as possible. Other problems might be prevented. Many studies have shown that early and regular prenatal care is important for the health of both mothers and their babies.  I AM THINKING ABOUT GETTING PREGNANT. HOW CAN I TAKE CARE OF MYSELF?  Taking care of yourself before you get pregnant helps you to have a healthy pregnancy. It also lowers your chances of having a baby born with a birth defect. Here are ways to take care of yourself before you get pregnant:   Eat healthy foods, exercise regularly (30 minutes per day for most days of the week is best), and get enough  rest and sleep. Talk to your caregiver about what kinds of foods and exercises are best for you.   Take 400 micrograms (mcg) of folic acid (one of the B vitamins) every day. The best way to do this is to take a daily multivitamin pill that contains this amount of folic acid. Getting enough of the synthetic (manufactured) form of folic acid every day before you get pregnant and during early pregnancy can help prevent certain birth defects. Many breakfast cereals and other grain products have folic acid added to them, but only certain cereals contain 400 mcg of folic acid per serving. Check the label on your multivitamin or cereal to find the amount of folic acid in the food.   See your caregiver for a complete check up before getting pregnant. Make sure that you have had all your immunization shots, especially for rubella (German measles). Rubella can cause serious birth defects. Chickenpox is another illness you want to avoid during pregnancy. If you have had chickenpox and rubella in the past, you should be immune to them.   Tell your caregiver about any prescription or non-prescription medicines (including herbal remedies) you are taking. Some medicines are not safe to take during pregnancy.   Stop smoking cigarettes, drinking alcohol, or taking illegal drugs. Ask your caregiver for help, if you need it. You can also get help with alcohol and drugs by talking with a member of your faith community, a counselor, or a trusted friend.   Discuss and treat any medical, social, or psychological   problems before getting pregnant.   Discuss any history of genetic problems in the mother, father, and their families. Do genetic testing before getting pregnant, when possible.   Discuss any physical or emotional abuse with your caregiver.   Discuss with your caregiver if you might be exposed to harmful chemicals on your job or where you live.   Discuss with your caregiver if you think your job or the hours you  work may be harmful and should be changed.   The father should be involved with the decision making and with all aspects of the pregnancy, labor, and delivery.   If you have medical insurance, make sure you are covered for pregnancy.  I JUST FOUND OUT THAT I AM PREGNANT. HOW CAN I TAKE CARE OF MYSELF?  Here are ways to take care of yourself and the precious new life growing inside you:   Continue taking your multivitamin with 400 micrograms (mcg) of folic acid every day.   Get early and regular prenatal care. It does not matter if this is your first pregnancy or if you already have children. It is very important to see a caregiver during your pregnancy. Your caregiver will check at each visit to make sure that you and the baby are healthy. If there are any problems, action can be taken right away to help you and the baby.   Eat a healthy diet that includes:   Fruits.   Vegetables.   Foods low in saturated fat.   Grains.   Calcium-rich foods.   Drink 6 to 8 glasses of liquids a day.   Unless your caregiver tells you not to, try to be physically active for 30 minutes, most days of the week. If you are pressed for time, you can get your activity in through 10 minute segments, three times a day.   If you smoke, drink alcohol, or use drugs, STOP. These can cause long-term damage to your baby. Talk with your caregiver about steps to take to stop smoking. Talk with a member of your faith community, a counselor, a trusted friend, or your caregiver if you are concerned about your alcohol or drug use.   Ask your caregiver before taking any medicine, even over-the-counter medicines. Some medicines are not safe to take during pregnancy.   Get plenty of rest and sleep.   Avoid hot tubs and saunas during pregnancy.   Do not have X-rays taken, unless absolutely necessary and with the recommendation of your caregiver. A lead shield can be placed on your abdomen, to protect the baby when X-rays  are taken in other parts of the body.   Do not empty the cat litter when you are pregnant. It may contain a parasite that causes an infection called toxoplasmosis, which can cause birth defects. Also, use gloves when working in garden areas used by cats.   Do not eat uncooked or undercooked cheese, meats, or fish.   Stay away from toxic chemicals like:   Insecticides.   Solvents (some cleaners or paint thinners).   Lead.   Mercury.   Sexual relations may continue until the end of the pregnancy, unless you have a medical problem or there is a problem with the pregnancy and your caregiver tells you not to.   Do not wear high heel shoes, especially during the second half of the pregnancy. You can lose your balance and fall.   Do not take long trips, unless absolutely necessary. Be sure to see your caregiver before   going on the trip.   Do not sit in one position for more than 2 hours, when on a trip.   Take a copy of your medical records when going on a trip.   Know where there is a hospital in the city you are visiting, in case of an emergency.   Most dangerous household products will have pregnancy warnings on their labels. Ask your caregiver about products if you are unsure.   Limit or eliminate your caffeine intake from coffee, tea, sodas, medicines, and chocolate.   Many women continue working through pregnancy. Staying active might help you stay healthier. If you have a question about the safety or the hours you work at your particular job, talk with your caregiver.   Get informed:   Read books.   Watch videos.   Go to childbirth classes for you and the father.   Talk with experienced moms.   Ask your caregiver about childbirth education classes for you and your partner. Classes can help you and your partner prepare for the birth of your baby.   Ask about a pediatrician (baby doctor) and methods and pain medicine for labor, delivery, and possible Cesarean delivery  (C-section).  I AM NOT THINKING ABOUT GETTING PREGNANT RIGHT NOW, BUT HEARD THAT ALL WOMEN SHOULD TAKE FOLIC ACID EVERY DAY?  All women of childbearing age, with even a remote chance of getting pregnant, should try to make sure they get enough folic acid. Many pregnancies are not planned. Many women do not know they are actually pregnant early in their pregnancies, and certain birth defects happen in the very early part of pregnancy. Taking 400 micrograms (mcg) of folic acid every day will help prevent certain birth defects that happen in the early part of pregnancy. If a woman begins taking vitamin pills in the second or third month of pregnancy, it may be too late to prevent birth defects. Folic acid may also have other health benefits for women, besides preventing birth defects.  HOW OFTEN SHOULD I SEE MY CAREGIVER DURING PREGNANCY?  Your caregiver will give you a schedule for your prenatal visits. You will have visits more often as you get closer to the end of your pregnancy. An average pregnancy lasts about 40 weeks.  A typical schedule includes visiting your caregiver:   About once each month, during your first 6 months of pregnancy.   Every 2 weeks, during the next 2 months.   Weekly in the last month, until the delivery date.  Your caregiver will probably want to see you more often if:  You are over 35.   Your pregnancy is high risk, because you have certain health problems or problems with the pregnancy, such as:   Diabetes.   High blood pressure.   The baby is not growing on schedule, according to the dates of the pregnancy.  Your caregiver will do special tests, to make sure you and the baby are not having any serious problems. WHAT HAPPENS DURING PRENATAL VISITS?   At your first prenatal visit, your caregiver will talk to you about you and your partner's health history and your family's health history, and will do a physical exam.   On your first visit, a physical exam will  include checks of your blood pressure, height and weight, and an exam of your pelvic organs. Your caregiver will do a Pap test if you have not had one recently, and will do cultures of your cervix to make sure there is no   infection.   At each visit, there will be tests of your blood, urine, blood pressure, weight, and checking the progress of the baby.   Your caregiver will be able to tell you when to expect that your baby will be born.   Each visit is also a chance for you to learn about staying healthy during pregnancy and for asking questions.   Discuss whether you will be breastfeeding.   At your later prenatal visits, your caregiver will check how you are doing and how the baby is developing. You may have a number of tests done as your pregnancy progresses.   Ultrasound exams are often used to check on the baby's growth and health.   You may have more urine and blood tests, as well as special tests, if needed. These may include amniocentesis (examine fluid in the pregnancy sac), stress tests (check how baby responds to contractions), biophysical profile (measures fetus well-being). Your caregiver will explain the tests and why they are necessary.  I AM IN MY LATE THIRTIES, AND I WANT TO HAVE A CHILD NOW. SHOULD I DO ANYTHING SPECIAL?  As you get older, there is more chance of having a medical problem (high blood pressure), pregnancy problem (preeclampsia, problems with the placenta), miscarriage, or a baby born with a birth defect. However, most women in their late thirties and early forties have healthy babies. See your caregiver on a regular basis before you get pregnant and be sure to go for exams throughout your pregnancy. Your caregiver probably will want to do some special tests to check on you and your baby's health when you are pregnant.  Women today are often delaying having children until later in life, when they are in their thirties and forties. While many women in their thirties  and forties have no difficulty getting pregnant, fertility does decline with age. For women over 40 who cannot get pregnant after 6 months of trying, it is recommended that they see their caregiver for a fertility evaluation. It is not uncommon to have trouble becoming pregnant or experience infertility (inability to become pregnant after trying for one year). If you think that you or your partner may be infertile, you can discuss this with your caregiver. He or she can recommend treatments such as drugs, surgery, or assisted reproductive technology.  Document Released: 08/08/2003 Document Revised: 07/25/2011 Document Reviewed: 07/05/2009 [facility] Patient Information 2012 [place], [place].  ------------------------------   Return to MAU or call your provider if you experience more than five contractions per hour, abdominal pain worsens, you bleed from your vagina, or you have loss of fluid.

## 2012-01-25 NOTE — MAU Note (Signed)
Pt states, " I slipped at the pool and went in stomach first. Since then I've had pain in my abdomen that is worse on the right  Side."

## 2012-01-25 NOTE — MAU Provider Note (Signed)
History     CSN: [id number]  Arrival date and time: 01/25/12 9528   First Provider Initiated Contact with Patient 01/25/12 2137      Chief Complaint  Patient presents with  . Fall  . Abdominal Pain   HPI  Slipped while getting into pool this afternoon.  Was standing on the edge of the pool and fell in, hitting the water with her stomach on the right side.  Since then, she has had abdominal pain primarily on her right site.  Pain is described as both shooting and cramping, constant, and has been gradually improving with time.  No contractions, no bleeding from vagina, no loss of fluid.  Baby is active as normal, is moving at time of history.  OB History    Grav Para Term Preterm Abortions TAB SAB Ect Mult Living   8 5 4 1 2  0 2 0 0 5      Past Medical History  Diagnosis Date  . Acid reflux     History reviewed. No pertinent past surgical history.  Family History  Problem Relation Age of Onset  . Anesthesia problems Neg Hx   . Diabetes Mother   . Diabetes Maternal Aunt   . Diabetes Maternal Uncle     History  Substance Use Topics  . Smoking status: Former Smoker -- 1.0 packs/day for 14 years    Types: Cigarettes    Quit date: 11/08/2011  . Smokeless tobacco: Never Used  . Alcohol Use: No    Allergies:  Allergies  Allergen Reactions  . Latex Itching  . Penicillins Swelling  . Pineapple Itching    Itching around face & mouth    Prescriptions prior to admission  Medication Sig Dispense Refill  . Omega-3 Fatty Acids (OMEGA 3 PO) Take 1 capsule by mouth daily.      . Prenatal Vit-Fe Fumarate-FA (PRENATAL MULTIVITAMIN) TABS Take 1 tablet by mouth daily. Has not picked them up yet, but plans on getting them as soon as she leaves today        Review of Systems  Constitutional: Negative for fever, chills and diaphoresis.  HENT: Negative for congestion, sore throat and tinnitus.   Eyes: Negative for blurred vision, double vision and pain.  Respiratory:  Negative for cough and shortness of breath.   Cardiovascular: Negative for chest pain and palpitations.  Gastrointestinal: Positive for heartburn and abdominal pain (See HPI). Negative for nausea, vomiting, diarrhea, constipation, blood in stool and melena.  Genitourinary: Negative for dysuria and hematuria.  Musculoskeletal: Positive for back pain (Back pain chronically through this pregnancy.) and falls (See HPI).  Skin: Negative for rash.  Neurological: Negative for dizziness, tingling, sensory change, speech change, focal weakness, seizures, loss of consciousness and headaches.  Endo/Heme/Allergies: Does not bruise/bleed easily.  Psychiatric/Behavioral: Negative for depression and hallucinations. The patient is not nervous/anxious.    Physical Exam   Blood pressure 117/62, pulse 91, temperature 98.6 F (37 C), temperature source Oral, resp. rate 20, height 5\' 2"  (1.575 m), weight 71.895 kg (158 lb 8 oz), last menstrual period 08/05/2011.  Physical Exam  Constitutional: She is oriented to person, place, and time. She appears well-developed and well-nourished. No distress.  HENT:  Head: Normocephalic and atraumatic.  Eyes: Conjunctivae and EOM are normal. Right eye exhibits no discharge. Left eye exhibits no discharge. No scleral icterus.  Neck: No tracheal deviation present.  Cardiovascular: Normal rate, regular rhythm and normal heart sounds.   Respiratory: Effort normal and breath sounds normal.  No stridor. No respiratory distress. She has no wheezes.  GI: Soft. Bowel sounds are normal. She exhibits no distension. There is no tenderness. There is no rebound and no guarding.       Gravid  Musculoskeletal: She exhibits no edema.  Neurological: She is alert and oriented to person, place, and time.  Skin: Skin is warm and dry. She is not diaphoretic.  Psychiatric: She has a normal mood and affect. Her behavior is normal. Judgment and thought content normal.   Uterus palpates  appropriate for gestational age. Cervix closed, posterior, thick, firm  MAU Course  Procedures  Continuous electronic fetal monitoring:  Fetal heart rate 140, accelerations present, no decels Toco: No contractions  Assessment and Plan  26 yo female at [redacted]w[redacted]d fell into pool, hit abdomen on right side.   History and exam not concerning.  Electronic fetal monitoring reassuring for a little over two hours.  Patient and family have outside commitment and would like to leave.  Given very low energy mechanism of injury, improvement of pain over the course of the day, and reassuring monitoring thus far, appropriate for discharge home.  Clancy Gourd 01/25/2012, 9:38 PM

## 2012-01-26 NOTE — MAU Provider Note (Signed)
I was present for the exam and agree with above.  [place], CNM 01/26/2012 3:16 AM

## 2012-01-31 ENCOUNTER — Ambulatory Visit (HOSPITAL_COMMUNITY)
Admission: RE | Admit: 2012-01-31 | Discharge: 2012-01-31 | Disposition: A | Discharge status: Home / Self Care | Payer: Medicaid Other | Source: Ambulatory Visit | Attending: Family Medicine | Admitting: Family Medicine

## 2012-01-31 DIAGNOSIS — Z1389 Encounter for screening for other disorder: Secondary | ICD-10-CM | POA: Insufficient documentation

## 2012-01-31 DIAGNOSIS — Z363 Encounter for antenatal screening for malformations: Secondary | ICD-10-CM | POA: Insufficient documentation

## 2012-01-31 DIAGNOSIS — W19XXXA Unspecified fall, initial encounter: Secondary | ICD-10-CM

## 2012-01-31 DIAGNOSIS — O358XX0 Maternal care for other (suspected) fetal abnormality and damage, not applicable or unspecified: Secondary | ICD-10-CM | POA: Insufficient documentation

## 2012-01-31 DIAGNOSIS — Z8751 Personal history of pre-term labor: Secondary | ICD-10-CM | POA: Insufficient documentation

## 2012-02-04 LAB — OB RESULTS CONSOLE RUBELLA ANTIBODY, IGM: Rubella: IMMUNE

## 2012-02-09 ENCOUNTER — Encounter (HOSPITAL_COMMUNITY): Payer: Self-pay | Admitting: Emergency Medicine

## 2012-02-09 ENCOUNTER — Emergency Department (HOSPITAL_COMMUNITY)
Admission: EM | Admit: 2012-02-09 | Discharge: 2012-02-09 | Disposition: A | Discharge status: Home / Self Care | Payer: Medicaid Other | Attending: Emergency Medicine | Admitting: Emergency Medicine

## 2012-02-09 DIAGNOSIS — Z331 Pregnant state, incidental: Secondary | ICD-10-CM | POA: Insufficient documentation

## 2012-02-09 DIAGNOSIS — K219 Gastro-esophageal reflux disease without esophagitis: Secondary | ICD-10-CM | POA: Insufficient documentation

## 2012-02-09 DIAGNOSIS — Z833 Family history of diabetes mellitus: Secondary | ICD-10-CM | POA: Insufficient documentation

## 2012-02-09 DIAGNOSIS — Z91018 Allergy to other foods: Secondary | ICD-10-CM | POA: Insufficient documentation

## 2012-02-09 DIAGNOSIS — S8990XA Unspecified injury of unspecified lower leg, initial encounter: Secondary | ICD-10-CM | POA: Insufficient documentation

## 2012-02-09 DIAGNOSIS — S99929A Unspecified injury of unspecified foot, initial encounter: Secondary | ICD-10-CM

## 2012-02-09 DIAGNOSIS — W2203XA Walked into furniture, initial encounter: Secondary | ICD-10-CM | POA: Insufficient documentation

## 2012-02-09 DIAGNOSIS — Z880 Allergy status to penicillin: Secondary | ICD-10-CM | POA: Insufficient documentation

## 2012-02-09 DIAGNOSIS — Z87891 Personal history of nicotine dependence: Secondary | ICD-10-CM | POA: Insufficient documentation

## 2012-02-09 MED ORDER — HYDROCODONE-ACETAMINOPHEN 5-325 MG PO TABS: 1.0000 | ORAL_TABLET | Freq: Four times a day (QID) | ORAL | Status: AC | PRN

## 2012-02-09 NOTE — ED Notes (Signed)
Pt alert, nad, c/o left middle toe pain, onset today after hitting on bedpost, resp even unlabored, skin pwd

## 2012-02-09 NOTE — ED Provider Notes (Signed)
Medical screening examination/treatment/procedure(s) were performed by non-physician practitioner and as supervising physician I was immediately available for consultation/collaboration.   Lyanne Co, MD 02/09/12 [phone]

## 2012-02-09 NOTE — ED Provider Notes (Signed)
History     CSN: [id number]  Arrival date & time 02/09/12  2122   First MD Initiated Contact with Patient 02/09/12 2220      HPI Patient reports she stubbed her toe onto the bedpost this evening. Reports toe bleeding slightly and having severe pain in left middle toe. Reports she is 6 months pregnant. States she has not tried anything to relieve the pain.  Patient is a 26 y.o. female presenting with toe pain.  Toe Pain This is a new problem. The current episode started today. The problem has been unchanged. Associated symptoms include joint swelling. Pertinent negatives include no numbness or weakness. The symptoms are aggravated by walking and standing. She has tried nothing for the symptoms.    Past Medical History  Diagnosis Date  . Acid reflux     History reviewed. No pertinent past surgical history.  Family History  Problem Relation Age of Onset  . Anesthesia problems Neg Hx   . Diabetes Mother   . Diabetes Maternal Aunt   . Diabetes Maternal Uncle     History  Substance Use Topics  . Smoking status: Former Smoker -- 1.0 packs/day for 14 years    Types: Cigarettes    Quit date: 11/08/2011  . Smokeless tobacco: Never Used  . Alcohol Use: No    OB History    Grav Para Term Preterm Abortions TAB SAB Ect Mult Living   8 5 4 1 2  0 2 0 0 5      Review of Systems  Musculoskeletal: Positive for joint swelling.       Toe pain, swelling, and bleeding  Neurological: Negative for weakness and numbness.  All other systems reviewed and are negative.    Allergies  Latex; Penicillins; and Pineapple  Home Medications   Current Outpatient Rx  Name Route Sig Dispense Refill  . OMEGA 3 PO Oral Take 1 capsule by mouth daily.    Marland Kitchen OMEPRAZOLE 20 MG PO CPDR Oral Take 20 mg by mouth daily.    Marland Kitchen PRENATAL MULTIVITAMIN CH Oral Take 1 tablet by mouth daily. Has not picked them up yet, but plans on getting them as soon as she leaves today    . VITAMIN E 400 UNITS PO CAPS Oral  Take 400 Units by mouth daily.      BP 124/81  Pulse 89  Temp 98 F (36.7 C)  Resp 16  SpO2 96%  LMP 08/05/2011  Physical Exam  Vitals reviewed. Constitutional: She is oriented to person, place, and time. Vital signs are normal. She appears well-developed and well-nourished. No distress.  HENT:  Head: Normocephalic and atraumatic.  Eyes: Pupils are equal, round, and reactive to light.  Neck: Neck supple.  Pulmonary/Chest: Effort normal.  Musculoskeletal:       Left third toe: Blood visible at distal tip of nail and no laceration. Blood is hemostatic. The entire toe is tender to palpation but no deformity palpated. No crepitus. Normal distal pulses. No plantar bony pain  Neurological: She is alert and oriented to person, place, and time.  Skin: Skin is warm and dry. No rash noted. No erythema. No pallor.  Psychiatric: She has a normal mood and affect. Her behavior is normal.    ED Course  Procedures   MDM  Discussed with patient that we with buddy tape toes and placed in a postop shoe. Advised x-ray likely would be done necessary radiation to the baby since it was unlikely that patient a acute angular  fracture. Advise treatment for most fractures include a tape and postop shoe and continue to hurt despite treatment she can followup with her OB/GYN for an orthopedic physician if needed. Patient agrees with plan and is ready for discharge.    Thomasene Lot, PA-C 02/09/12 2310

## 2012-04-18 ENCOUNTER — Other Ambulatory Visit: Payer: Self-pay | Admitting: Obstetrics

## 2012-04-29 ENCOUNTER — Other Ambulatory Visit: Payer: Self-pay | Admitting: Obstetrics

## 2012-05-04 ENCOUNTER — Encounter (HOSPITAL_COMMUNITY): Payer: Self-pay

## 2012-05-05 ENCOUNTER — Encounter (HOSPITAL_COMMUNITY)
Admission: RE | Admit: 2012-05-05 | Discharge: 2012-05-05 | Disposition: A | Discharge status: Home / Self Care | Payer: Medicaid Other | Source: Ambulatory Visit | Attending: Obstetrics | Admitting: Obstetrics

## 2012-05-05 ENCOUNTER — Encounter (HOSPITAL_COMMUNITY): Payer: Self-pay

## 2012-05-05 HISTORY — DX: Urinary tract infection, site not specified: N39.0

## 2012-05-05 LAB — TYPE AND SCREEN
ABO/RH(D): A POS
Antibody Screen: NEGATIVE

## 2012-05-05 LAB — CBC
Hemoglobin: 13 g/dL (ref 12.0–15.0)
RBC: 3.92 MIL/uL (ref 3.87–5.11)

## 2012-05-05 LAB — SURGICAL PCR SCREEN
MRSA, PCR: NEGATIVE
Staphylococcus aureus: NEGATIVE

## 2012-05-05 LAB — RPR: RPR Ser Ql: NONREACTIVE

## 2012-05-05 LAB — ABO/RH: ABO/RH(D): A POS

## 2012-05-05 NOTE — Patient Instructions (Signed)
Your procedure is scheduled on:05/08/12  Enter through the Main Entrance at :1:30 pm Pick up desk phone and dial [phone] and inform us of your arrival.  Please call [phone] if you have any problems the morning of surgery.  Remember: Do not eat after midnight Thursday, except toast ok until 7am Fri  Do not drink after:11 am Fri  Take these meds the morning of surgery with a sip of water:all usual morning meds  DO NOT wear jewelry, eye make-up, lipstick,body lotion, or dark fingernail polish. Do not shave for 48 hours prior to surgery.  If you are to be admitted after surgery, leave suitcase in car until your room has been assigned. Patients discharged on the day of surgery will not be allowed to drive home.   Remember to use your Hibiclens as instructed.

## 2012-05-08 ENCOUNTER — Encounter (HOSPITAL_COMMUNITY): Payer: Self-pay | Admitting: *Deleted

## 2012-05-08 ENCOUNTER — Inpatient Hospital Stay (HOSPITAL_COMMUNITY)
Admission: RE | Admit: 2012-05-08 | Discharge: 2012-05-11 | DRG: 766 | Disposition: A | Discharge status: Home / Self Care | Payer: Medicaid Other | Source: Ambulatory Visit | Attending: Obstetrics | Admitting: Obstetrics

## 2012-05-08 ENCOUNTER — Encounter (HOSPITAL_COMMUNITY)
Admission: RE | Disposition: A | Discharge status: Home / Self Care | Payer: Self-pay | Source: Ambulatory Visit | Attending: Obstetrics

## 2012-05-08 ENCOUNTER — Encounter (HOSPITAL_COMMUNITY): Payer: Self-pay

## 2012-05-08 ENCOUNTER — Inpatient Hospital Stay (HOSPITAL_COMMUNITY): Payer: Medicaid Other

## 2012-05-08 ENCOUNTER — Encounter (HOSPITAL_COMMUNITY): Payer: Self-pay | Admitting: Anesthesiology

## 2012-05-08 DIAGNOSIS — A6000 Herpesviral infection of urogenital system, unspecified: Secondary | ICD-10-CM | POA: Diagnosis present

## 2012-05-08 DIAGNOSIS — O98519 Other viral diseases complicating pregnancy, unspecified trimester: Principal | ICD-10-CM | POA: Diagnosis present

## 2012-05-08 SURGERY — Surgical Case
Anesthesia: Spinal | Site: Abdomen | Wound class: Clean Contaminated

## 2012-05-08 MED ORDER — METOCLOPRAMIDE HCL 5 MG/ML IJ SOLN: 10.0000 mg | Freq: Three times a day (TID) | INTRAMUSCULAR | Status: DC | PRN

## 2012-05-08 MED ORDER — OXYCODONE-ACETAMINOPHEN 5-325 MG PO TABS
1.0000 | ORAL_TABLET | ORAL | Status: DC | PRN
  Administered 2012-05-09: 1 via ORAL
  Administered 2012-05-09: 2 via ORAL
  Administered 2012-05-09: 1 via ORAL
  Administered 2012-05-09: 2 via ORAL
  Administered 2012-05-10: 1 via ORAL
  Administered 2012-05-10 (×3): 2 via ORAL
  Administered 2012-05-10: 1 via ORAL
  Administered 2012-05-10 – 2012-05-11 (×2): 2 via ORAL
  Filled 2012-05-08: qty 2
  Filled 2012-05-08 (×4): qty 1
  Filled 2012-05-08 (×6): qty 2

## 2012-05-08 MED ORDER — OXYTOCIN 10 UNIT/ML IJ SOLN
INTRAMUSCULAR | Status: DC | PRN
  Administered 2012-05-08: 40 [IU] via INTRAMUSCULAR

## 2012-05-08 MED ORDER — SENNOSIDES-DOCUSATE SODIUM 8.6-50 MG PO TABS
2.0000 | ORAL_TABLET | Freq: Every day | ORAL | Status: DC
  Administered 2012-05-08 – 2012-05-09 (×2): 2 via ORAL

## 2012-05-08 MED ORDER — FENTANYL CITRATE 0.05 MG/ML IJ SOLN
INTRAMUSCULAR | Status: DC | PRN
  Administered 2012-05-08: 25 ug via INTRATHECAL

## 2012-05-08 MED ORDER — ONDANSETRON HCL 4 MG PO TABS
4.0000 mg | ORAL_TABLET | ORAL | Status: DC | PRN
  Administered 2012-05-10: 4 mg via ORAL
  Filled 2012-05-08: qty 1

## 2012-05-08 MED ORDER — PNEUMOCOCCAL VAC POLYVALENT 25 MCG/0.5ML IJ INJ
0.5000 mL | INJECTION | INTRAMUSCULAR | Status: AC
  Administered 2012-05-09: 0.5 mL via INTRAMUSCULAR
  Filled 2012-05-08: qty 0.5

## 2012-05-08 MED ORDER — DIPHENHYDRAMINE HCL 50 MG/ML IJ SOLN: 12.5000 mg | INTRAMUSCULAR | Status: DC | PRN

## 2012-05-08 MED ORDER — INFLUENZA VIRUS VACC SPLIT PF IM SUSP
0.5000 mL | INTRAMUSCULAR | Status: AC
  Filled 2012-05-08: qty 0.5

## 2012-05-08 MED ORDER — MORPHINE SULFATE (PF) 0.5 MG/ML IJ SOLN
INTRAMUSCULAR | Status: DC | PRN
  Administered 2012-05-08: 150 ug via INTRATHECAL

## 2012-05-08 MED ORDER — NALBUPHINE HCL 10 MG/ML IJ SOLN
5.0000 mg | INTRAMUSCULAR | Status: DC | PRN
  Administered 2012-05-09: 10 mg via INTRAVENOUS
  Filled 2012-05-08 (×2): qty 1

## 2012-05-08 MED ORDER — MEDROXYPROGESTERONE ACETATE 150 MG/ML IM SUSP: 150.0000 mg | INTRAMUSCULAR | Status: DC | PRN

## 2012-05-08 MED ORDER — DIPHENHYDRAMINE HCL 25 MG PO CAPS: 25.0000 mg | ORAL_CAPSULE | Freq: Four times a day (QID) | ORAL | Status: DC | PRN

## 2012-05-08 MED ORDER — IBUPROFEN 600 MG PO TABS
600.0000 mg | ORAL_TABLET | Freq: Four times a day (QID) | ORAL | Status: DC
  Administered 2012-05-09 – 2012-05-11 (×9): 600 mg via ORAL
  Filled 2012-05-08 (×9): qty 1

## 2012-05-08 MED ORDER — OXYTOCIN 40 UNITS IN LACTATED RINGERS INFUSION - SIMPLE MED: 62.5000 mL/h | INTRAVENOUS | Status: AC

## 2012-05-08 MED ORDER — SIMETHICONE 80 MG PO CHEW
80.0000 mg | CHEWABLE_TABLET | Freq: Three times a day (TID) | ORAL | Status: DC
  Administered 2012-05-08 – 2012-05-11 (×9): 80 mg via ORAL

## 2012-05-08 MED ORDER — ONDANSETRON HCL 4 MG/2ML IJ SOLN
INTRAMUSCULAR | Status: DC | PRN
  Administered 2012-05-08: 4 mg via INTRAVENOUS

## 2012-05-08 MED ORDER — PHENYLEPHRINE HCL 10 MG/ML IJ SOLN
INTRAMUSCULAR | Status: DC | PRN
  Administered 2012-05-08 (×3): 80 ug via INTRAVENOUS

## 2012-05-08 MED ORDER — BUPIVACAINE HCL 0.75 % IJ SOLN: INTRAMUSCULAR | Status: DC | PRN

## 2012-05-08 MED ORDER — SCOPOLAMINE 1 MG/3DAYS TD PT72
1.0000 | MEDICATED_PATCH | Freq: Once | TRANSDERMAL | Status: DC
  Administered 2012-05-08: 1.5 mg via TRANSDERMAL

## 2012-05-08 MED ORDER — MEPERIDINE HCL 25 MG/ML IJ SOLN: 6.2500 mg | INTRAMUSCULAR | Status: DC | PRN

## 2012-05-08 MED ORDER — 0.9 % SODIUM CHLORIDE (POUR BTL) OPTIME
TOPICAL | Status: DC | PRN
  Administered 2012-05-08: 1000 mL

## 2012-05-08 MED ORDER — FENTANYL CITRATE 0.05 MG/ML IJ SOLN: 25.0000 ug | INTRAMUSCULAR | Status: DC | PRN

## 2012-05-08 MED ORDER — MENTHOL 3 MG MT LOZG: 1.0000 | LOZENGE | OROMUCOSAL | Status: DC | PRN

## 2012-05-08 MED ORDER — CLINDAMYCIN PHOSPHATE 900 MG/50ML IV SOLN
900.0000 mg | INTRAVENOUS | Status: AC
  Administered 2012-05-08: 900 mg via INTRAVENOUS
  Filled 2012-05-08: qty 50

## 2012-05-08 MED ORDER — LACTATED RINGERS IV SOLN
INTRAVENOUS | Status: DC
  Administered 2012-05-08 (×2): 125 mL/h via INTRAVENOUS
  Administered 2012-05-08: 16:00:00 via INTRAVENOUS

## 2012-05-08 MED ORDER — NALOXONE HCL 0.4 MG/ML IJ SOLN: 0.4000 mg | INTRAMUSCULAR | Status: DC | PRN

## 2012-05-08 MED ORDER — OXYTOCIN 10 UNIT/ML IJ SOLN
INTRAMUSCULAR | Status: AC
  Filled 2012-05-08: qty 4

## 2012-05-08 MED ORDER — SCOPOLAMINE 1 MG/3DAYS TD PT72
MEDICATED_PATCH | TRANSDERMAL | Status: AC
  Administered 2012-05-08: 1.5 mg via TRANSDERMAL
  Filled 2012-05-08: qty 1

## 2012-05-08 MED ORDER — ONDANSETRON HCL 4 MG/2ML IJ SOLN: 4.0000 mg | Freq: Three times a day (TID) | INTRAMUSCULAR | Status: DC | PRN

## 2012-05-08 MED ORDER — ACETAMINOPHEN 10 MG/ML IV SOLN
1000.0000 mg | Freq: Once | INTRAVENOUS | Status: AC
  Administered 2012-05-08: 1000 mg via INTRAVENOUS
  Filled 2012-05-08: qty 100

## 2012-05-08 MED ORDER — CIPROFLOXACIN IN D5W 400 MG/200ML IV SOLN: 400.0000 mg | INTRAVENOUS | Status: DC

## 2012-05-08 MED ORDER — LANOLIN HYDROUS EX OINT: 1.0000 "application " | TOPICAL_OINTMENT | CUTANEOUS | Status: DC | PRN

## 2012-05-08 MED ORDER — NALBUPHINE HCL 10 MG/ML IJ SOLN
5.0000 mg | INTRAMUSCULAR | Status: DC | PRN
  Administered 2012-05-08: 10 mg via SUBCUTANEOUS
  Filled 2012-05-08: qty 1

## 2012-05-08 MED ORDER — LACTATED RINGERS IV SOLN
INTRAVENOUS | Status: DC | PRN
  Administered 2012-05-08: 16:00:00 via INTRAVENOUS

## 2012-05-08 MED ORDER — MORPHINE SULFATE 0.5 MG/ML IJ SOLN
INTRAMUSCULAR | Status: AC
  Filled 2012-05-08: qty 10

## 2012-05-08 MED ORDER — DIPHENHYDRAMINE HCL 25 MG PO CAPS
25.0000 mg | ORAL_CAPSULE | ORAL | Status: DC | PRN
  Administered 2012-05-10 (×3): 25 mg via ORAL
  Filled 2012-05-08 (×3): qty 1

## 2012-05-08 MED ORDER — TETANUS-DIPHTH-ACELL PERTUSSIS 5-2.5-18.5 LF-MCG/0.5 IM SUSP
0.5000 mL | Freq: Once | INTRAMUSCULAR | Status: AC
  Administered 2012-05-10: 0.5 mL via INTRAMUSCULAR
  Filled 2012-05-08: qty 0.5

## 2012-05-08 MED ORDER — HYDROMORPHONE HCL PF 1 MG/ML IJ SOLN
0.5000 mg | Freq: Once | INTRAMUSCULAR | Status: AC
  Administered 2012-05-08: 0.5 mg via INTRAVENOUS
  Filled 2012-05-08: qty 1

## 2012-05-08 MED ORDER — SODIUM CHLORIDE 0.9 % IV SOLN: 0.5000 mg/h | Freq: Once | INTRAVENOUS | Status: DC

## 2012-05-08 MED ORDER — KETOROLAC TROMETHAMINE 30 MG/ML IJ SOLN
30.0000 mg | Freq: Four times a day (QID) | INTRAMUSCULAR | Status: AC | PRN
  Administered 2012-05-08: 30 mg via INTRAVENOUS

## 2012-05-08 MED ORDER — ONDANSETRON HCL 4 MG/2ML IJ SOLN: 4.0000 mg | INTRAMUSCULAR | Status: DC | PRN

## 2012-05-08 MED ORDER — KETOROLAC TROMETHAMINE 30 MG/ML IJ SOLN: 30.0000 mg | Freq: Four times a day (QID) | INTRAMUSCULAR | Status: AC | PRN

## 2012-05-08 MED ORDER — LACTATED RINGERS IV SOLN
INTRAVENOUS | Status: DC
  Administered 2012-05-09: 03:00:00 via INTRAVENOUS

## 2012-05-08 MED ORDER — KETOROLAC TROMETHAMINE 30 MG/ML IJ SOLN
INTRAMUSCULAR | Status: AC
  Filled 2012-05-08: qty 1

## 2012-05-08 MED ORDER — WITCH HAZEL-GLYCERIN EX PADS: 1.0000 "application " | MEDICATED_PAD | CUTANEOUS | Status: DC | PRN

## 2012-05-08 MED ORDER — ZOLPIDEM TARTRATE 5 MG PO TABS: 5.0000 mg | ORAL_TABLET | Freq: Every evening | ORAL | Status: DC | PRN

## 2012-05-08 MED ORDER — SIMETHICONE 80 MG PO CHEW: 80.0000 mg | CHEWABLE_TABLET | ORAL | Status: DC | PRN

## 2012-05-08 MED ORDER — NALBUPHINE SYRINGE 5 MG/0.5 ML
INJECTION | INTRAMUSCULAR | Status: AC
  Administered 2012-05-08: 10 mg via SUBCUTANEOUS
  Filled 2012-05-08: qty 1

## 2012-05-08 MED ORDER — PHENYLEPHRINE 40 MCG/ML (10ML) SYRINGE FOR IV PUSH (FOR BLOOD PRESSURE SUPPORT)
PREFILLED_SYRINGE | INTRAVENOUS | Status: AC
  Filled 2012-05-08: qty 10

## 2012-05-08 MED ORDER — SODIUM CHLORIDE 0.9 % IV SOLN: 1.0000 ug/kg/h | INTRAVENOUS | Status: DC | PRN

## 2012-05-08 MED ORDER — DIPHENHYDRAMINE HCL 50 MG/ML IJ SOLN: 25.0000 mg | INTRAMUSCULAR | Status: DC | PRN

## 2012-05-08 MED ORDER — BUPIVACAINE IN DEXTROSE 0.75-8.25 % IT SOLN
INTRATHECAL | Status: DC | PRN
  Administered 2012-05-08: 1.4 mL via INTRATHECAL

## 2012-05-08 MED ORDER — PRENATAL MULTIVITAMIN CH
1.0000 | ORAL_TABLET | Freq: Every day | ORAL | Status: DC
  Administered 2012-05-09 – 2012-05-11 (×3): 1 via ORAL
  Filled 2012-05-08 (×3): qty 1

## 2012-05-08 MED ORDER — SODIUM CHLORIDE 0.9 % IJ SOLN: 3.0000 mL | INTRAMUSCULAR | Status: DC | PRN

## 2012-05-08 MED ORDER — ONDANSETRON HCL 4 MG/2ML IJ SOLN
INTRAMUSCULAR | Status: AC
  Filled 2012-05-08: qty 2

## 2012-05-08 MED ORDER — IBUPROFEN 600 MG PO TABS: 600.0000 mg | ORAL_TABLET | Freq: Four times a day (QID) | ORAL | Status: DC | PRN

## 2012-05-08 MED ORDER — FENTANYL CITRATE 0.05 MG/ML IJ SOLN
INTRAMUSCULAR | Status: AC
  Filled 2012-05-08: qty 2

## 2012-05-08 MED ORDER — DIBUCAINE 1 % RE OINT: 1.0000 "application " | TOPICAL_OINTMENT | RECTAL | Status: DC | PRN

## 2012-05-08 SURGICAL SUPPLY — 43 items
ADH SKN CLS APL DERMABOND .7 (GAUZE/BANDAGES/DRESSINGS)
CANISTER WOUND CARE 500ML ATS (WOUND CARE) IMPLANT
CHLORAPREP W/TINT 26ML (MISCELLANEOUS) ×2 IMPLANT
CLOTH BEACON ORANGE TIMEOUT ST (SAFETY) ×2 IMPLANT
CONTAINER PREFILL 10% NBF 15ML (MISCELLANEOUS) ×4 IMPLANT
DERMABOND ADVANCED (GAUZE/BANDAGES/DRESSINGS)
DERMABOND ADVANCED .7 DNX12 (GAUZE/BANDAGES/DRESSINGS) ×1 IMPLANT
DRSG COVADERM 4X10 (GAUZE/BANDAGES/DRESSINGS) ×1 IMPLANT
DRSG VAC ATS LRG SENSATRAC (GAUZE/BANDAGES/DRESSINGS) IMPLANT
DRSG VAC ATS MED SENSATRAC (GAUZE/BANDAGES/DRESSINGS) IMPLANT
DRSG VAC ATS SM SENSATRAC (GAUZE/BANDAGES/DRESSINGS) IMPLANT
ELECT REM PT RETURN 9FT ADLT (ELECTROSURGICAL) ×2
ELECTRODE REM PT RTRN 9FT ADLT (ELECTROSURGICAL) ×1 IMPLANT
EXTRACTOR VACUUM M CUP 4 TUBE (SUCTIONS) IMPLANT
GLOVE BIO SURGEON STRL SZ8 (GLOVE) ×4 IMPLANT
GOWN PREVENTION PLUS LG XLONG (DISPOSABLE) ×4 IMPLANT
GOWN PREVENTION PLUS XLARGE (GOWN DISPOSABLE) ×2 IMPLANT
KIT ABG SYR 3ML LUER SLIP (SYRINGE) IMPLANT
NDL HYPO 25X5/8 SAFETYGLIDE (NEEDLE) ×1 IMPLANT
NEEDLE HYPO 25X5/8 SAFETYGLIDE (NEEDLE) ×2 IMPLANT
NS IRRIG 1000ML POUR BTL (IV SOLUTION) ×2 IMPLANT
PACK C SECTION WH (CUSTOM PROCEDURE TRAY) ×2 IMPLANT
PAD OB MATERNITY 4.3X12.25 (PERSONAL CARE ITEMS) ×1 IMPLANT
RTRCTR C-SECT PINK 25CM LRG (MISCELLANEOUS) ×2 IMPLANT
SLEEVE SCD COMPRESS KNEE MED (MISCELLANEOUS) ×1 IMPLANT
STAPLER VISISTAT 35W (STAPLE) ×1 IMPLANT
SUT GUT PLAIN 0 CT-3 TAN 27 (SUTURE) IMPLANT
SUT MNCRL 0 VIOLET CTX 36 (SUTURE) ×3 IMPLANT
SUT MNCRL AB 4-0 PS2 18 (SUTURE) IMPLANT
SUT MON AB 2-0 CT1 27 (SUTURE) ×2 IMPLANT
SUT MON AB 3-0 SH 27 (SUTURE)
SUT MON AB 3-0 SH27 (SUTURE) IMPLANT
SUT MONOCRYL 0 CTX 36 (SUTURE) ×3
SUT PDS AB 0 CTX 60 (SUTURE) IMPLANT
SUT PLAIN 2 0 XLH (SUTURE) IMPLANT
SUT VIC AB 0 CTX 36 (SUTURE)
SUT VIC AB 0 CTX36XBRD ANBCTRL (SUTURE) IMPLANT
SUT VIC AB 2-0 CT1 (SUTURE) ×1 IMPLANT
SUT VIC AB 2-0 CT1 27 (SUTURE)
SUT VIC AB 2-0 CT1 TAPERPNT 27 (SUTURE) IMPLANT
TOWEL OR 17X24 6PK STRL BLUE (TOWEL DISPOSABLE) ×4 IMPLANT
TRAY FOLEY CATH 14FR (SET/KITS/TRAYS/PACK) ×1 IMPLANT
WATER STERILE IRR 1000ML POUR (IV SOLUTION) ×1 IMPLANT

## 2012-05-08 NOTE — Anesthesia Postprocedure Evaluation (Signed)
  Anesthesia Post-op Note  Patient: Jenny Baldwin  Procedure(s) Performed: Procedure(s) (LRB) with comments: CESAREAN SECTION (N/A) - Primary Cesarean Section Delivery Girl @ 1555, Apgars 9/9  Patient Location: PACU  Anesthesia Type: Spinal  Level of Consciousness: awake, alert  and oriented  Airway and Oxygen Therapy: Patient Spontanous Breathing  Post-op Pain: none  Post-op Assessment: Post-op Vital signs reviewed, Patient's Cardiovascular Status Stable, Respiratory Function Stable, Patent Airway, No signs of Nausea or vomiting, Pain level controlled, No headache, No backache and No residual numbness  Post-op Vital Signs: Reviewed and stable  Complications: No apparent anesthesia complications

## 2012-05-08 NOTE — Transfer of Care (Signed)
Immediate Anesthesia Transfer of Care Note  Patient: Jenny Baldwin  Procedure(s) Performed: Procedure(s) (LRB) with comments: CESAREAN SECTION (N/A) - Primary Cesarean Section Delivery Girl @ 1555, Apgars 9/9  Patient Location: PACU  Anesthesia Type: Spinal  Level of Consciousness: awake  Airway & Oxygen Therapy: Patient Spontanous Breathing  Post-op Assessment: Report given to PACU RN  Post vital signs: Reviewed and stable  Complications: No apparent anesthesia complications

## 2012-05-08 NOTE — Anesthesia Preprocedure Evaluation (Signed)
Anesthesia Evaluation    Airway Mallampati: III TM Distance: >3 FB Neck ROM: Full    Dental No notable dental hx. (+) Teeth Intact   Pulmonary neg pulmonary ROS, former smoker,  breath sounds clear to auscultation  Pulmonary exam normal       Cardiovascular negative cardio ROS  Rhythm:Regular Rate:Normal     Neuro/Psych negative neurological ROS  negative psych ROS   GI/Hepatic Neg liver ROS, GERD-  Medicated and Controlled,  Endo/Other  negative endocrine ROS  Renal/GU negative Renal ROS   Genital Herpes    Musculoskeletal negative musculoskeletal ROS (+)   Abdominal   Peds  Hematology negative hematology ROS (+)   Anesthesia Other Findings   Reproductive/Obstetrics (+) Pregnancy                           Anesthesia Physical Anesthesia Plan  ASA: II  Anesthesia Plan: Spinal   Post-op Pain Management:    Induction:   Airway Management Planned: Natural Airway  Additional Equipment:   Intra-op Plan:   Post-operative Plan:   Informed Consent: I have reviewed the patients History and Physical, chart, labs and discussed the procedure including the risks, benefits and alternatives for the proposed anesthesia with the patient or authorized representative who has indicated his/her understanding and acceptance.   Dental advisory given  Plan Discussed with: CRNA, Anesthesiologist and Surgeon  Anesthesia Plan Comments:         Anesthesia Quick Evaluation

## 2012-05-08 NOTE — H&P (Signed)
Jenny Baldwin is a 26 y.o. female presenting for elective primary C/S for genital herpes.. Maternal Medical History:  Reason for admission: 26 yo G8 P86.  EDC 05-11-12.  H/O genital Herpes.  Desires primary C/S.  Risks, complications and benefits explained to patient's satisfaction.  Fetal activity: Perceived fetal activity is normal.   Last perceived fetal movement was within the past hour.    Prenatal complications: Genital Herpes.  Prenatal Complications - Diabetes: none.    OB History    Grav Para Term Preterm Abortions TAB SAB Ect Mult Living   8 5 4 1 2  0 2 0 0 5     Past Medical History  Diagnosis Date  . Acid reflux   . Recurrent UTI     with pregnancy  . SVD (spontaneous vaginal delivery)     x 5   Past Surgical History  Procedure Date  . No past surgeries    Family History: family history includes Diabetes in her maternal aunt, maternal uncle, and mother.  There is no history of Anesthesia problems. Social History:  reports that she quit smoking about 5 months ago. Her smoking use included Cigarettes. She has a 14 pack-year smoking history. She has never used smokeless tobacco. She reports that she does not drink alcohol or use illicit drugs.   Prenatal Transfer Tool  Maternal Diabetes: No Genetic Screening: Normal Maternal Ultrasounds/Referrals: Normal Fetal Ultrasounds or other Referrals:  None Maternal Substance Abuse:  No Significant Maternal Medications:  Meds include: Other:  PNV, Valtrex Significant Maternal Lab Results:  Lab values include: Other:  Other Comments:  None  Review of Systems  All other systems reviewed and are negative.      Blood pressure 124/75, pulse 79, temperature 98.6 F (37 C), temperature source Oral, resp. rate 16, last menstrual period 08/05/2011, SpO2 99.00%. Maternal Exam:  Abdomen: Patient reports no abdominal tenderness. Pelvis: adequate for delivery.   Cervix: not evaluated.   Physical Exam  Nursing note and  vitals reviewed. Constitutional: She is oriented to person, place, and time. She appears well-developed and well-nourished.  HENT:  Head: Normocephalic and atraumatic.  Eyes: Conjunctivae normal are normal. Pupils are equal, round, and reactive to light.  Neck: Normal range of motion. Neck supple.  Cardiovascular: Normal rate and regular rhythm.   Respiratory: Effort normal.  GI: Soft.  Musculoskeletal: Normal range of motion.  Neurological: She is alert and oriented to person, place, and time.  Skin: Skin is warm and dry.  Psychiatric: She has a normal mood and affect. Her behavior is normal. Judgment and thought content normal.    Prenatal labs: ABO, Rh: --/--/A POS (09/17 5621) Antibody: NEG (09/17 0921) Rubella:   RPR: NON REACTIVE (09/17 0922)  HBsAg: Negative (09/16 0000)  HIV: Non-reactive (09/16 0000)  GBS:     Assessment/Plan: 39.4 weeks.  Genital herpes.  Elective primary C/S.   Jenny Baldwin A 05/08/2012, 2:45 PM

## 2012-05-08 NOTE — Op Note (Signed)
Cesarean Section Procedure Note   Jenny Baldwin   05/08/2012  Indications: Scheduled Proceedure/Maternal Request   Pre-operative Diagnosis: genital herpes.   Post-operative Diagnosis: Same   Surgeon: [name],[name]  Assistants: Surgical Technician  Anesthesia: spinal  Procedure Details:  The patient was seen in the Holding Room. The risks, benefits, complications, treatment options, and expected outcomes were discussed with the patient. The patient concurred with the proposed plan, giving informed consent. The patient was identified as [name] and the procedure verified as C-Section Delivery. A Time Out was held and the above information confirmed.  After induction of anesthesia, the patient was draped and prepped in the usual sterile manner. A transverse incision was made and carried down through the subcutaneous tissue to the fascia. The fascial incision was made and extended transversely. The fascia was separated from the underlying rectus tissue superiorly and inferiorly. The peritoneum was identified and entered. The peritoneal incision was extended longitudinally. The utero-vesical peritoneal reflection was incised transversely and the bladder flap was bluntly freed from the lower uterine segment. A low transverse uterine incision was made. Delivered from cephalic presentation was a 2835 gram living newborn female infant(s) with Apgar scores of 9 at one minute and 9 at five minutes. A cord ph was not sent. The umbilical cord was clamped and cut cord. A sample was obtained for evaluation. The placenta was removed Intact and appeared normal.  The uterine incision was closed with running locked sutures of 1-0 Monocryl. A second imbricating layer of the same suture was placed.  Hemostasis was observed. The paracolic gutters were irrigated. The parieto peritoneum was closed in a running fashion with 2-0 Vicryl.  The fascia was then reapproximated with running sutures of 0 Vicryl.  The skin  was closed with staples.  Instrument, sponge, and needle counts were correct prior the abdominal closure and were correct at the conclusion of the case.    Findings:   Estimated Blood Loss: * No blood loss amount entered *   Total IV Fluids:   Urine Output: 300CC OF clear urine  Specimens: @ORSPECIMEN @   Complications: no complications  Disposition: PACU - hemodynamically stable.  Maternal Condition: stable   Baby condition / location:  nursery-stable    Signed: Surgeon(s): Brock Bad, MD

## 2012-05-08 NOTE — Anesthesia Procedure Notes (Signed)
Spinal  Patient location during procedure: OR Start time: 05/08/2012 3:36 PM Staffing Anesthesiologist: Allix Blomquist A. Performed by: anesthesiologist  Preanesthetic Checklist Completed: patient identified, site marked, surgical consent, pre-op evaluation, timeout performed, IV checked, risks and benefits discussed and monitors and equipment checked Spinal Block Patient position: sitting Prep: site prepped and draped and DuraPrep Patient monitoring: heart rate, cardiac monitor, continuous pulse ox and blood pressure Approach: midline Location: L3-4 Injection technique: single-shot Needle Needle type: Sprotte  Needle gauge: 24 G Needle length: 9 cm Needle insertion depth: 4 cm Assessment Sensory level: T4 Events: paresthesia Additional Notes Patient tolerated procedure well. Adequate sensory block. Transient paresthesia buttocks.

## 2012-05-09 LAB — CBC
HCT: 34.6 % — ABNORMAL LOW (ref 36.0–46.0)
Hemoglobin: 11.9 g/dL — ABNORMAL LOW (ref 12.0–15.0)
MCHC: 34.4 g/dL (ref 30.0–36.0)

## 2012-05-09 NOTE — Progress Notes (Signed)
Patient ID: Jenny Baldwin, female   DOB: Jan 01, 1986, 26 y.o.   MRN: [id number] Postop day 1 Vital signs normal Fundus firm Legs negative No complaints

## 2012-05-10 ENCOUNTER — Encounter (HOSPITAL_COMMUNITY): Payer: Self-pay | Admitting: Obstetrics

## 2012-05-10 MED ORDER — INFLUENZA VIRUS VACC SPLIT PF IM SUSP: 0.5000 mL | Freq: Once | INTRAMUSCULAR | Status: DC

## 2012-05-10 NOTE — Progress Notes (Signed)
Called doctor per patient request for discharge today, Dr Gaynell Face declined.

## 2012-05-10 NOTE — Progress Notes (Signed)
Patient ID: Jenny Baldwin, female   DOB: 06-Jan-1986, 26 y.o.   MRN: [id number] Postop day 2 Vital signs normal Incision clean and dry Lochia moderate Legs negative Doing well

## 2012-05-11 MED ORDER — OXYCODONE-ACETAMINOPHEN 5-325 MG PO TABS: 1.0000 | ORAL_TABLET | Freq: Four times a day (QID) | ORAL | Status: DC | PRN

## 2012-05-11 NOTE — Progress Notes (Signed)
UR chart review completed.  

## 2012-05-11 NOTE — Discharge Summary (Signed)
  Obstetric Discharge Summary Reason for Admission: cesarean section Prenatal Procedures: none Intrapartum Procedures: cesarean: low cervical, transverse Postpartum Procedures: none Complications-Operative and Postpartum: none  Hemoglobin  Date Value Range Status  05/09/2012 11.9* 12.0 - 15.0 g/dL Final     HCT  Date Value Range Status  05/09/2012 34.6* 36.0 - 46.0 % Final    Physical Exam:  General: alert Lochia: appropriate Uterine: firm Incision: clean, dry and intact DVT Evaluation: No evidence of DVT seen on physical exam.  Discharge Diagnoses: Active Problems:  Cesarean delivery, without mention of indication, delivered, with or without mention of antepartum condition   Discharge Information: Date: 05/11/2012 Activity: pelvic rest Diet: routine Medications:  Prior to Admission medications   Medication Sig Start Date End Date Taking? Authorizing Provider  cholecalciferol (VITAMIN D) 400 UNITS TABS Take 400 Units by mouth 2 (two) times daily.   Yes Historical Provider, MD  Omega-3 Fatty Acids (OMEGA 3 PO) Take 1,000 mg by mouth daily.    Yes Historical Provider, MD  omeprazole (PRILOSEC) 20 MG capsule Take 20 mg by mouth 2 (two) times daily.    Yes Historical Provider, MD  Prenatal Vit-Fe Fumarate-FA (PRENATAL MULTIVITAMIN) TABS Take 1 tablet by mouth daily. Has not picked them up yet, but plans on getting them as soon as she leaves today   Yes Historical Provider, MD  vitamin E 400 UNIT capsule Take 400 Units by mouth daily.   Yes Historical Provider, MD  HYDROcodone-acetaminophen (VICODIN ES) 7.5-750 MG per tablet Take 1 tablet by mouth every 6 (six) hours.    Historical Provider, MD  oxyCODONE-acetaminophen (PERCOCET/ROXICET) 5-325 MG per tablet Take 1-2 tablets by mouth every 6 (six) hours as needed (moderate - severe pain). 05/11/12   Antionette Char, MD  Pramoxine-HC (HYDROCORTISONE ACE-PRAMOXINE) 2.5-1 % CREA Apply 1 application topically 2 (two) times daily as  needed. hemorrhoids    Historical Provider, MD  valACYclovir (VALTREX) 1000 MG tablet Take 1,000 mg by mouth daily.    Historical Provider, MD    Condition: stable Instructions: refer to routine discharge instructions Discharge to: home Follow-up Information    Follow up with [name],[name], MD. Schedule an appointment as soon as possible for a visit in 2 weeks.   Contact information:   [address] [place] [address] [phone]          Newborn Data: Live born  Information for the patient's newborn:  Jenny Baldwin [[id number]]  female  Home with mother.  Jenny Baldwin A 05/11/2012, 8:26 AM

## 2012-05-12 ENCOUNTER — Other Ambulatory Visit: Payer: Self-pay | Admitting: Obstetrics

## 2013-04-20 ENCOUNTER — Emergency Department (HOSPITAL_COMMUNITY)
Admission: EM | Admit: 2013-04-20 | Discharge: 2013-04-21 | Disposition: A | Discharge status: Other Acute Inpatient Hospital | Payer: Medicaid Other | Attending: Emergency Medicine | Admitting: Emergency Medicine

## 2013-04-20 ENCOUNTER — Encounter (HOSPITAL_COMMUNITY): Payer: Self-pay | Admitting: Emergency Medicine

## 2013-04-20 DIAGNOSIS — Z8639 Personal history of other endocrine, nutritional and metabolic disease: Secondary | ICD-10-CM | POA: Insufficient documentation

## 2013-04-20 DIAGNOSIS — S71009A Unspecified open wound, unspecified hip, initial encounter: Secondary | ICD-10-CM | POA: Insufficient documentation

## 2013-04-20 DIAGNOSIS — Z862 Personal history of diseases of the blood and blood-forming organs and certain disorders involving the immune mechanism: Secondary | ICD-10-CM | POA: Insufficient documentation

## 2013-04-20 DIAGNOSIS — Z880 Allergy status to penicillin: Secondary | ICD-10-CM | POA: Insufficient documentation

## 2013-04-20 DIAGNOSIS — F319 Bipolar disorder, unspecified: Secondary | ICD-10-CM

## 2013-04-20 DIAGNOSIS — M25559 Pain in unspecified hip: Secondary | ICD-10-CM | POA: Insufficient documentation

## 2013-04-20 DIAGNOSIS — X789XXA Intentional self-harm by unspecified sharp object, initial encounter: Secondary | ICD-10-CM | POA: Insufficient documentation

## 2013-04-20 DIAGNOSIS — Z79899 Other long term (current) drug therapy: Secondary | ICD-10-CM | POA: Insufficient documentation

## 2013-04-20 DIAGNOSIS — F918 Other conduct disorders: Secondary | ICD-10-CM | POA: Insufficient documentation

## 2013-04-20 DIAGNOSIS — R4689 Other symptoms and signs involving appearance and behavior: Secondary | ICD-10-CM

## 2013-04-20 DIAGNOSIS — K219 Gastro-esophageal reflux disease without esophagitis: Secondary | ICD-10-CM | POA: Insufficient documentation

## 2013-04-20 DIAGNOSIS — S71109A Unspecified open wound, unspecified thigh, initial encounter: Secondary | ICD-10-CM | POA: Insufficient documentation

## 2013-04-20 DIAGNOSIS — R45851 Suicidal ideations: Secondary | ICD-10-CM | POA: Insufficient documentation

## 2013-04-20 DIAGNOSIS — J3489 Other specified disorders of nose and nasal sinuses: Secondary | ICD-10-CM | POA: Insufficient documentation

## 2013-04-20 DIAGNOSIS — Z91040 Latex allergy status: Secondary | ICD-10-CM | POA: Insufficient documentation

## 2013-04-20 DIAGNOSIS — F1010 Alcohol abuse, uncomplicated: Secondary | ICD-10-CM | POA: Insufficient documentation

## 2013-04-20 DIAGNOSIS — Z87891 Personal history of nicotine dependence: Secondary | ICD-10-CM | POA: Insufficient documentation

## 2013-04-20 DIAGNOSIS — F411 Generalized anxiety disorder: Secondary | ICD-10-CM | POA: Insufficient documentation

## 2013-04-20 DIAGNOSIS — G8929 Other chronic pain: Secondary | ICD-10-CM | POA: Insufficient documentation

## 2013-04-20 DIAGNOSIS — S51809A Unspecified open wound of unspecified forearm, initial encounter: Secondary | ICD-10-CM | POA: Insufficient documentation

## 2013-04-20 DIAGNOSIS — R51 Headache: Secondary | ICD-10-CM | POA: Insufficient documentation

## 2013-04-20 DIAGNOSIS — Z87440 Personal history of urinary (tract) infections: Secondary | ICD-10-CM | POA: Insufficient documentation

## 2013-04-20 DIAGNOSIS — F4310 Post-traumatic stress disorder, unspecified: Secondary | ICD-10-CM | POA: Insufficient documentation

## 2013-04-20 DIAGNOSIS — Z3202 Encounter for pregnancy test, result negative: Secondary | ICD-10-CM | POA: Insufficient documentation

## 2013-04-20 HISTORY — DX: Pain in right hip: M25.551

## 2013-04-20 HISTORY — DX: Prediabetes: R73.03

## 2013-04-20 HISTORY — DX: Bipolar disorder, unspecified: F31.9

## 2013-04-20 HISTORY — DX: Post-traumatic stress disorder, unspecified: F43.10

## 2013-04-20 HISTORY — DX: Other chronic pain: G89.29

## 2013-04-20 LAB — COMPREHENSIVE METABOLIC PANEL
ALT: 22 U/L (ref 0–35)
BUN: 7 mg/dL (ref 6–23)
CO2: 21 mEq/L (ref 19–32)
Calcium: 8.6 mg/dL (ref 8.4–10.5)
Creatinine, Ser: 0.68 mg/dL (ref 0.50–1.10)
GFR calc Af Amer: >90 mL/min (ref >90)
GFR calc non Af Amer: >90 mL/min (ref >90)
Glucose, Bld: 103 mg/dL — ABNORMAL HIGH (ref 70–99)
Total Protein: 7.6 g/dL (ref 6.0–8.3)

## 2013-04-20 LAB — CBC WITH DIFFERENTIAL/PLATELET
Eosinophils Absolute: 0 10*3/uL (ref 0.0–0.7)
Eosinophils Relative: 0 % (ref 0–5)
Hemoglobin: 14.3 g/dL (ref 12.0–15.0)
Lymphocytes Relative: 28 % (ref 12–46)
Lymphs Abs: 2.6 10*3/uL (ref 0.7–4.0)
MCH: 34.6 pg — ABNORMAL HIGH (ref 26.0–34.0)
MCV: 94.2 fL (ref 78.0–100.0)
Monocytes Relative: 7 % (ref 3–12)
Neutrophils Relative %: 64 % (ref 43–77)
Platelets: 291 10*3/uL (ref 150–400)
RBC: 4.13 MIL/uL (ref 3.87–5.11)
WBC: 9.3 10*3/uL (ref 4.0–10.5)

## 2013-04-20 LAB — ETHANOL: Alcohol, Ethyl (B): 161 mg/dL — ABNORMAL HIGH (ref 0–11)

## 2013-04-20 LAB — SALICYLATE LEVEL: Salicylate Lvl: <2 mg/dL — ABNORMAL LOW (ref 2.8–20.0)

## 2013-04-20 LAB — ACETAMINOPHEN LEVEL: Acetaminophen (Tylenol), Serum: <15 ug/mL (ref 10–30)

## 2013-04-20 MED ORDER — LORAZEPAM 2 MG/ML IJ SOLN
2.0000 mg | Freq: Once | INTRAMUSCULAR | Status: AC
  Administered 2013-04-20: 2 mg via INTRAMUSCULAR

## 2013-04-20 MED ORDER — DIPHENHYDRAMINE HCL 50 MG/ML IJ SOLN
25.0000 mg | Freq: Once | INTRAMUSCULAR | Status: AC
  Administered 2013-04-20: 25 mg via INTRAMUSCULAR

## 2013-04-20 MED ORDER — DIPHENHYDRAMINE HCL 50 MG/ML IJ SOLN
25.0000 mg | Freq: Once | INTRAMUSCULAR | Status: DC
  Filled 2013-04-20: qty 1

## 2013-04-20 MED ORDER — METOCLOPRAMIDE HCL 5 MG/ML IJ SOLN: 5.0000 mg | Freq: Once | INTRAMUSCULAR | Status: DC

## 2013-04-20 MED ORDER — HALOPERIDOL LACTATE 5 MG/ML IJ SOLN
5.0000 mg | Freq: Once | INTRAMUSCULAR | Status: AC
  Administered 2013-04-20: 5 mg via INTRAMUSCULAR

## 2013-04-20 MED ORDER — HALOPERIDOL LACTATE 5 MG/ML IJ SOLN
INTRAMUSCULAR | Status: AC
  Administered 2013-04-20: 5 mg via INTRAMUSCULAR
  Filled 2013-04-20: qty 1

## 2013-04-20 MED ORDER — LORAZEPAM 2 MG/ML IJ SOLN
INTRAMUSCULAR | Status: AC
  Administered 2013-04-20: 2 mg via INTRAMUSCULAR
  Filled 2013-04-20: qty 1

## 2013-04-20 MED ORDER — SODIUM CHLORIDE 0.9 % IV BOLUS (SEPSIS): 1000.0000 mL | Freq: Once | INTRAVENOUS | Status: DC

## 2013-04-20 NOTE — ED Notes (Addendum)
Pt lying on bed talking very loudly and using profanity.  After much discussion, pt stated she is going to allow staff to clean her wounds and will allow the inventorying of her belongings other than her watch, which she states she will not allow staff to take.  Pt admits to drinking 1 and a 1/3 bottles of wine tonight.

## 2013-04-20 NOTE — ED Notes (Signed)
Charge RN and [facility] notified of patient in department and need of sitter.  Patient brought back to room D34 with belongings, personal clothing on and MD notified of her behavior.

## 2013-04-20 NOTE — ED Provider Notes (Signed)
CSN: [id number]     Arrival date & time 04/20/13  2207 History   First MD Initiated Contact with Patient 04/20/13 2252     Chief Complaint  Patient presents with  . Suicidal   (Consider location/radiation/quality/duration/timing/severity/associated sxs/prior Treatment) HPI This is a 27 year old female with a history of bipolar disorder who presents with suicidal ideation and cutting behavior. Patient has a history of cutting. She was brought in by her husband today. She cut herself multiple times over the bilateral thighs and left forearm with a needle. She states that she wanted to harm herself but "I did want to kill myself but I know how to."  She states him here because "I'm psycho." She states that she would slit her rest if she wanted to kill herself. She does state that she tried to overdose on pills 2 weeks ago. She endorses alcohol use today but denies any illicit drug use. Patient denies any fever, chest pain, shortness of breath, abdominal pain, urinary symptoms, focal weakness or numbness. She does state she has a headache. She has a history of headache. She denies any vision changes or focal deficit. Past Medical History  Diagnosis Date  . Acid reflux   . Recurrent UTI     with pregnancy  . SVD (spontaneous vaginal delivery)     x 5  . Bipolar 1 disorder   . PTSD (post-traumatic stress disorder)   . Chronic right hip pain   . Prediabetes    Past Surgical History  Procedure Laterality Date  . No past surgeries    . Cesarean section  05/08/2012    Procedure: CESAREAN SECTION;  Surgeon: Brock Bad, MD;  Location: [facility];  Service: Obstetrics;  Laterality: N/A;  Primary Cesarean Section Delivery Girl @ 1555, Apgars 9/9   Family History  Problem Relation Age of Onset  . Anesthesia problems Neg Hx   . Diabetes Mother   . Diabetes Maternal Aunt   . Diabetes Maternal Uncle    History  Substance Use Topics  . Smoking status: Former Smoker -- 1.00 packs/day for 14 years    Types: Cigarettes    Quit date: 11/08/2011  . Smokeless tobacco: Never Used  . Alcohol Use: Yes   OB History   Grav Para Term Preterm Abortions TAB SAB Ect Mult Living   8 6 5 1 2  0 2 0 0 6     Review of Systems  Constitutional: Negative for fever.  HENT: Positive for congestion. Negative for sore throat.   Eyes: Negative for visual disturbance.  Respiratory: Negative for cough, chest tightness and shortness of breath.   Cardiovascular: Negative for chest pain and palpitations.  Gastrointestinal: Negative for nausea, vomiting and abdominal pain.  Genitourinary: Negative for dysuria.  Musculoskeletal: Negative for back pain.  Skin: Positive for wound. Negative for rash.  Neurological: Positive for headaches. Negative for dizziness, syncope, light-headedness and numbness.  Psychiatric/Behavioral: Negative for confusion.  All other systems reviewed and are negative.    Allergies  Latex; Penicillins; and Pineapple  Home Medications   Current Outpatient Rx  Name  Route  Sig  Dispense  Refill  . cholecalciferol (VITAMIN D) 400 UNITS TABS   Oral   Take 400 Units by mouth 2 (two) times daily.         Marland Kitchen HYDROcodone-acetaminophen (VICODIN ES) 7.5-750 MG per tablet   Oral   Take 1 tablet by mouth every 6 (six) hours.         . Omega-3  Fatty Acids (OMEGA 3 PO)   Oral   Take 1,000 mg by mouth daily.          Marland Kitchen omeprazole (PRILOSEC) 20 MG capsule   Oral   Take 20 mg by mouth 2 (two) times daily.          Marland Kitchen oxyCODONE-acetaminophen (PERCOCET/ROXICET) 5-325 MG per tablet   Oral   Take 1-2 tablets by mouth every 6 (six) hours as needed (moderate - severe pain).   30 tablet   0   . Pramoxine-HC (HYDROCORTISONE ACE-PRAMOXINE) 2.5-1 % CREA   Apply externally   Apply 1 application topically 2 (two) times daily as needed. hemorrhoids         . Prenatal Vit-Fe Fumarate-FA (PRENATAL MULTIVITAMIN) TABS   Oral   Take 1 tablet by mouth daily. Has not picked them up yet,  but plans on getting them as soon as she leaves today         . valACYclovir (VALTREX) 1000 MG tablet   Oral   Take 1,000 mg by mouth daily.         . vitamin E 400 UNIT capsule   Oral   Take 400 Units by mouth daily.          BP 109/59  Pulse 83  Temp(Src) 98.1 F (36.7 C) (Oral)  Resp 20  SpO2 90% Physical Exam  Nursing note and vitals reviewed. Constitutional: She is oriented to person, place, and time. She appears well-developed and well-nourished. No distress.  anxious  HENT:  Head: Normocephalic and atraumatic.  Eyes: EOM are normal. Pupils are equal, round, and reactive to light.  Neck: Neck supple.  Cardiovascular: Normal rate, regular rhythm and normal heart sounds.   Pulmonary/Chest: Effort normal and breath sounds normal. No respiratory distress. She has no wheezes.  Abdominal: Soft. Bowel sounds are normal. She exhibits no distension. There is no tenderness.  Well healed c section scar  Musculoskeletal: She exhibits no edema.  Neurological: She is alert and oriented to person, place, and time. No cranial nerve deficit. Coordination normal.  Skin: Skin is warm and dry.  Psychiatric:  Tangential in speech. Flight of ideas. Patient is redirectable. Endorses self-harm    ED Course  Procedures (including critical care time) Labs Review Labs Reviewed  CBC WITH DIFFERENTIAL - Abnormal; Notable for the following:    MCH 34.6 (*)    MCHC 36.8 (*)    All other components within normal limits  ETHANOL - Abnormal; Notable for the following:    Alcohol, Ethyl (B) 161 (*)    All other components within normal limits  COMPREHENSIVE METABOLIC PANEL - Abnormal; Notable for the following:    Sodium 148 (*)    Glucose, Bld 103 (*)    Total Bilirubin 0.2 (*)    All other components within normal limits  SALICYLATE LEVEL - Abnormal; Notable for the following:    Salicylate Lvl <2.0 (*)    All other components within normal limits  ACETAMINOPHEN LEVEL  URINALYSIS,  ROUTINE W REFLEX MICROSCOPIC  URINE RAPID DRUG SCREEN (HOSP PERFORMED)   Imaging Review No results found.  MDM   1. Bipolar disorder   2. Suicidal behavior   3. Combative behavoir     This a 27 year old female who presents with multiple cuts in an attempt to hurt herself. She denies frank suicidal ideation at this time but does have a history of it.  She is very tangential in her thought and speech. She is nontoxic-appearing.  She is complaining of a headache but otherwise has no other physical complaints. Medical clearance labwork was initiated.  I was called to the patient's bedside for combativeness and self-harm behavior. The patient was noted to be standing beside her car yelling at the staff. She then proceeded to try to grab the cord from the thermometer and around her neck. She was restrained by multiple medical staff and police. The patient became belligerent and was not redirectable. She continued to curse at the staff, kick, and attempted to bite multiple staff members. Patient was given Haldol, Benadryl, and Ativan. She continued to be combative. She received Geodon. She was placed in 4 point restraints as she is a hard to herself and others. I discussed the patient with the ACT team. An IVC paperwork was filled out.    Shon Baton, MD 04/21/13 [phone]

## 2013-04-20 NOTE — ED Notes (Signed)
Patient refusing to have personal belongings taken away from her, patient becoming loud, threatening to this RN, stating that she will "F anyone up if you take my personal belongings away".  This RN walked from triage room to retrieve paper scrubs for patient, upon return to room, patient had taken lancet for her blood sugar checks and sliced her thighs bilaterally and her left upper arm and forearm.  Patient continues being loud and verbally threatening.  [employer] summoned to triage room.  Patient becomes more verbally threatening and loud, screaming.  Patient cleaned of blood on thighs and left arm.  Patient escorted to D34 with [employer] and this RN.  MD notified of patient.

## 2013-04-20 NOTE — ED Notes (Signed)
Patient states she is suicidal, she has been cutting her left arm.  She states she has had a history of cutting.  Her husband brought her here for help.  Patient very is very hostile at this time, wanting to have personal belongings with her.  It was explained rules of being here for suicidal ideation and she stated that she will "F them up if they take my personal belongings".

## 2013-04-21 ENCOUNTER — Inpatient Hospital Stay (HOSPITAL_COMMUNITY)
Admission: AD | Admit: 2013-04-21 | Discharge: 2013-04-23 | DRG: 885 | Disposition: A | Discharge status: Home / Self Care | Payer: Medicaid Other | Source: Intra-hospital | Attending: Psychiatry | Admitting: Psychiatry

## 2013-04-21 ENCOUNTER — Encounter (HOSPITAL_COMMUNITY): Payer: Self-pay | Admitting: Behavioral Health

## 2013-04-21 DIAGNOSIS — F10939 Alcohol use, unspecified with withdrawal, unspecified: Secondary | ICD-10-CM | POA: Diagnosis present

## 2013-04-21 DIAGNOSIS — F10239 Alcohol dependence with withdrawal, unspecified: Secondary | ICD-10-CM | POA: Diagnosis present

## 2013-04-21 DIAGNOSIS — F4310 Post-traumatic stress disorder, unspecified: Secondary | ICD-10-CM

## 2013-04-21 DIAGNOSIS — F319 Bipolar disorder, unspecified: Secondary | ICD-10-CM | POA: Diagnosis present

## 2013-04-21 DIAGNOSIS — F1020 Alcohol dependence, uncomplicated: Secondary | ICD-10-CM | POA: Diagnosis present

## 2013-04-21 DIAGNOSIS — F321 Major depressive disorder, single episode, moderate: Principal | ICD-10-CM | POA: Diagnosis present

## 2013-04-21 DIAGNOSIS — F39 Unspecified mood [affective] disorder: Secondary | ICD-10-CM

## 2013-04-21 DIAGNOSIS — R45851 Suicidal ideations: Secondary | ICD-10-CM

## 2013-04-21 DIAGNOSIS — Z79899 Other long term (current) drug therapy: Secondary | ICD-10-CM

## 2013-04-21 LAB — POCT PREGNANCY, URINE: Preg Test, Ur: NEGATIVE

## 2013-04-21 LAB — URINALYSIS, ROUTINE W REFLEX MICROSCOPIC
Glucose, UA: NEGATIVE mg/dL
Hgb urine dipstick: NEGATIVE
Protein, ur: NEGATIVE mg/dL
pH: 6.5 (ref 5.0–8.0)

## 2013-04-21 LAB — URINE MICROSCOPIC-ADD ON

## 2013-04-21 LAB — RAPID URINE DRUG SCREEN, HOSP PERFORMED
Amphetamines: NOT DETECTED
Benzodiazepines: POSITIVE — AB
Cocaine: NOT DETECTED
Opiates: NOT DETECTED

## 2013-04-21 MED ORDER — OMEGA-3-ACID ETHYL ESTERS 1 G PO CAPS
1.0000 g | ORAL_CAPSULE | Freq: Every day | ORAL | Status: DC
  Administered 2013-04-22 – 2013-04-23 (×2): 1 g via ORAL
  Filled 2013-04-21 (×5): qty 1

## 2013-04-21 MED ORDER — FLUOXETINE HCL 20 MG PO CAPS
20.0000 mg | ORAL_CAPSULE | Freq: Every day | ORAL | Status: DC
  Administered 2013-04-22 – 2013-04-23 (×2): 20 mg via ORAL
  Filled 2013-04-21 (×2): qty 1
  Filled 2013-04-21: qty 4
  Filled 2013-04-21 (×2): qty 1

## 2013-04-21 MED ORDER — TRAMADOL HCL 50 MG PO TABS
50.0000 mg | ORAL_TABLET | Freq: Two times a day (BID) | ORAL | Status: DC | PRN
Start: 2013-04-21 — End: 2013-04-21

## 2013-04-21 MED ORDER — ALUM & MAG HYDROXIDE-SIMETH 200-200-20 MG/5ML PO SUSP: 30.0000 mL | ORAL | Status: DC | PRN

## 2013-04-21 MED ORDER — LOPERAMIDE HCL 2 MG PO CAPS
2.0000 mg | ORAL_CAPSULE | ORAL | Status: DC | PRN
  Administered 2013-04-21: 4 mg via ORAL

## 2013-04-21 MED ORDER — THIAMINE HCL 100 MG/ML IJ SOLN: 100.0000 mg | Freq: Once | INTRAMUSCULAR | Status: DC

## 2013-04-21 MED ORDER — OMEGA-3-ACID ETHYL ESTERS 1 G PO CAPS
1.0000 g | ORAL_CAPSULE | Freq: Every day | ORAL | Status: DC
  Administered 2013-04-21: 1 g via ORAL
  Filled 2013-04-21: qty 1

## 2013-04-21 MED ORDER — TRAZODONE HCL 50 MG PO TABS: 50.0000 mg | ORAL_TABLET | Freq: Every evening | ORAL | Status: DC | PRN

## 2013-04-21 MED ORDER — CHLORDIAZEPOXIDE HCL 25 MG PO CAPS: 25.0000 mg | ORAL_CAPSULE | Freq: Once | ORAL | Status: DC

## 2013-04-21 MED ORDER — ACETAMINOPHEN 325 MG PO TABS
650.0000 mg | ORAL_TABLET | Freq: Four times a day (QID) | ORAL | Status: DC | PRN
  Administered 2013-04-22 (×2): 650 mg via ORAL

## 2013-04-21 MED ORDER — CHLORDIAZEPOXIDE HCL 25 MG PO CAPS: 25.0000 mg | ORAL_CAPSULE | ORAL | Status: DC

## 2013-04-21 MED ORDER — QUETIAPINE FUMARATE 25 MG PO TABS: 150.0000 mg | ORAL_TABLET | Freq: Every day | ORAL | Status: DC

## 2013-04-21 MED ORDER — ZIPRASIDONE MESYLATE 20 MG IM SOLR
20.0000 mg | Freq: Once | INTRAMUSCULAR | Status: AC
  Administered 2013-04-21: 20 mg via INTRAMUSCULAR
  Filled 2013-04-21: qty 20

## 2013-04-21 MED ORDER — HYDROXYZINE HCL 25 MG PO TABS: 25.0000 mg | ORAL_TABLET | Freq: Four times a day (QID) | ORAL | Status: DC | PRN

## 2013-04-21 MED ORDER — FLUOXETINE HCL 20 MG PO CAPS
20.0000 mg | ORAL_CAPSULE | Freq: Every day | ORAL | Status: DC
  Administered 2013-04-21: 20 mg via ORAL
  Filled 2013-04-21: qty 1

## 2013-04-21 MED ORDER — VALACYCLOVIR HCL 500 MG PO TABS
1000.0000 mg | ORAL_TABLET | Freq: Every day | ORAL | Status: DC
Start: 2013-04-21 — End: 2013-04-21
  Administered 2013-04-21: 1000 mg via ORAL
  Filled 2013-04-21: qty 2

## 2013-04-21 MED ORDER — QUETIAPINE FUMARATE 50 MG PO TABS
150.0000 mg | ORAL_TABLET | Freq: Every day | ORAL | Status: DC
  Administered 2013-04-21 – 2013-04-22 (×2): 150 mg via ORAL
  Filled 2013-04-21 (×5): qty 1

## 2013-04-21 MED ORDER — CHLORDIAZEPOXIDE HCL 25 MG PO CAPS: 25.0000 mg | ORAL_CAPSULE | Freq: Every day | ORAL | Status: DC

## 2013-04-21 MED ORDER — ONDANSETRON 4 MG PO TBDP: 4.0000 mg | ORAL_TABLET | Freq: Four times a day (QID) | ORAL | Status: DC | PRN

## 2013-04-21 MED ORDER — VITAMIN B-1 100 MG PO TABS: 100.0000 mg | ORAL_TABLET | Freq: Every day | ORAL | Status: DC

## 2013-04-21 MED ORDER — MAGNESIUM HYDROXIDE 400 MG/5ML PO SUSP: 30.0000 mL | Freq: Every day | ORAL | Status: DC | PRN

## 2013-04-21 MED ORDER — CHLORDIAZEPOXIDE HCL 25 MG PO CAPS
25.0000 mg | ORAL_CAPSULE | Freq: Four times a day (QID) | ORAL | Status: AC
  Administered 2013-04-21 – 2013-04-23 (×6): 25 mg via ORAL
  Filled 2013-04-21 (×6): qty 1

## 2013-04-21 MED ORDER — CARBAMAZEPINE ER 200 MG PO TB12
300.0000 mg | ORAL_TABLET | Freq: Two times a day (BID) | ORAL | Status: DC
  Administered 2013-04-21 – 2013-04-23 (×4): 300 mg via ORAL
  Filled 2013-04-21: qty 2
  Filled 2013-04-21 (×6): qty 1

## 2013-04-21 MED ORDER — ADULT MULTIVITAMIN W/MINERALS CH: 1.0000 | ORAL_TABLET | Freq: Every day | ORAL | Status: DC

## 2013-04-21 MED ORDER — VALACYCLOVIR HCL 500 MG PO TABS
1000.0000 mg | ORAL_TABLET | Freq: Every day | ORAL | Status: DC
  Administered 2013-04-22 – 2013-04-23 (×2): 1000 mg via ORAL
  Filled 2013-04-21: qty 1
  Filled 2013-04-21 (×4): qty 2

## 2013-04-21 MED ORDER — CHLORDIAZEPOXIDE HCL 25 MG PO CAPS: 25.0000 mg | ORAL_CAPSULE | Freq: Four times a day (QID) | ORAL | Status: DC

## 2013-04-21 MED ORDER — CHLORDIAZEPOXIDE HCL 25 MG PO CAPS: 25.0000 mg | ORAL_CAPSULE | Freq: Four times a day (QID) | ORAL | Status: DC | PRN

## 2013-04-21 MED ORDER — CLONAZEPAM 0.5 MG PO TABS: 0.5000 mg | ORAL_TABLET | Freq: Three times a day (TID) | ORAL | Status: DC | PRN

## 2013-04-21 MED ORDER — CHLORDIAZEPOXIDE HCL 25 MG PO CAPS
25.0000 mg | ORAL_CAPSULE | Freq: Three times a day (TID) | ORAL | Status: DC
  Administered 2013-04-23: 25 mg via ORAL
  Filled 2013-04-21: qty 1

## 2013-04-21 MED ORDER — VITAMIN E 180 MG (400 UNIT) PO CAPS
400.0000 [IU] | ORAL_CAPSULE | Freq: Every day | ORAL | Status: DC
  Filled 2013-04-21: qty 1

## 2013-04-21 MED ORDER — CHLORDIAZEPOXIDE HCL 25 MG PO CAPS: 25.0000 mg | ORAL_CAPSULE | Freq: Three times a day (TID) | ORAL | Status: DC

## 2013-04-21 MED ORDER — NICOTINE 21 MG/24HR TD PT24
21.0000 mg | MEDICATED_PATCH | Freq: Every day | TRANSDERMAL | Status: DC
  Administered 2013-04-22 – 2013-04-23 (×2): 21 mg via TRANSDERMAL
  Filled 2013-04-21 (×4): qty 1

## 2013-04-21 MED ORDER — CARBAMAZEPINE ER 200 MG PO TB12
300.0000 mg | ORAL_TABLET | Freq: Two times a day (BID) | ORAL | Status: DC
  Administered 2013-04-21: 300 mg via ORAL
  Filled 2013-04-21 (×2): qty 1

## 2013-04-21 MED ORDER — VITAMIN E 180 MG (400 UNIT) PO CAPS
400.0000 [IU] | ORAL_CAPSULE | Freq: Every day | ORAL | Status: DC
  Administered 2013-04-21: 400 [IU] via ORAL
  Filled 2013-04-21: qty 1

## 2013-04-21 MED ORDER — CIPROFLOXACIN HCL 500 MG PO TABS
500.0000 mg | ORAL_TABLET | Freq: Two times a day (BID) | ORAL | Status: DC
  Administered 2013-04-21 – 2013-04-23 (×4): 500 mg via ORAL
  Filled 2013-04-21 (×8): qty 1

## 2013-04-21 MED ORDER — CIPROFLOXACIN HCL 500 MG PO TABS: 500.0000 mg | ORAL_TABLET | Freq: Two times a day (BID) | ORAL | Status: DC

## 2013-04-21 MED ORDER — CHOLECALCIFEROL 10 MCG (400 UNIT) PO TABS
400.0000 [IU] | ORAL_TABLET | Freq: Two times a day (BID) | ORAL | Status: DC
  Administered 2013-04-21: 400 [IU] via ORAL
  Filled 2013-04-21 (×2): qty 1

## 2013-04-21 MED ORDER — CHOLECALCIFEROL 10 MCG (400 UNIT) PO TABS
400.0000 [IU] | ORAL_TABLET | Freq: Two times a day (BID) | ORAL | Status: DC
  Administered 2013-04-22 – 2013-04-23 (×4): 400 [IU] via ORAL
  Filled 2013-04-21 (×11): qty 1

## 2013-04-21 MED ORDER — STERILE WATER FOR INJECTION IJ SOLN
INTRAMUSCULAR | Status: AC
  Administered 2013-04-21: 1.2 mL
  Filled 2013-04-21: qty 10

## 2013-04-21 MED ORDER — LOPERAMIDE HCL 2 MG PO CAPS: 2.0000 mg | ORAL_CAPSULE | ORAL | Status: DC | PRN

## 2013-04-21 MED ORDER — ADULT MULTIVITAMIN W/MINERALS CH
1.0000 | ORAL_TABLET | Freq: Every day | ORAL | Status: DC
  Administered 2013-04-21 – 2013-04-23 (×3): 1 via ORAL
  Filled 2013-04-21 (×5): qty 1

## 2013-04-21 NOTE — ED Notes (Addendum)
Patient arrived to POD C with sitter.   Patient still asleep.  Will not wake up patient for psych assessment.   I will try to speak with patient when she wakes up.

## 2013-04-21 NOTE — ED Notes (Signed)
Spoke with Berna Spare and he advised that dayshift team will callback to do teleassessment with patient around 0830.

## 2013-04-21 NOTE — ED Notes (Addendum)
PATIENT TRANSFERRED BACK TO [facility]. PAPERWORK ISSUE HAS BEEN RESOLVED. [employer] REMAINED WITH PATIENT HERE IN ED UNTIL ISSUE RESOLVED

## 2013-04-21 NOTE — ED Notes (Signed)
PATIENT TO NURSES STATION TO CALL HER HUSBAND

## 2013-04-21 NOTE — [facility] Group Notes (Signed)
Adult Psychoeducational Group Note  Date:  04/21/2013 Time:  10:31 PM  Group Topic/Focus:  Wrap-Up Group:   The focus of this group is to help patients review their daily goal of treatment and discuss progress on daily workbooks.  Participation Level:  Did Not Attend  Participation Quality:  none  Affect:  none  Cognitive:  nono  Insight: None  Engagement in Group:  none  Modes of Intervention:  none  Additional Comments:  Patient did not attend group.  Delia Chimes 04/21/2013, 10:31 PM

## 2013-04-21 NOTE — ED Notes (Signed)
SPOKE WITH CARLEE IN PHARMACY. SHE ADVISES THE PHARMACIST MUST VERIFY MEDS AND IT WILL PROBOBLY BE ANOTHER 30 MINUTES BEFORE MEDS ARE SENT

## 2013-04-21 NOTE — [facility] Counselor (Signed)
Pt arrived to [facility] accompanied by [employer]. Upon examination of the IVC documents, it was noted that the Examination and Recommendation was incomplete. The pt was returned to [facility] to have the examination completed and returned to [facility]. Writer spoke with Salomon Mast and he confirmed that the only thing that needed to be redone is the Examination and Recommendation. Writer was informed that the IVC documents are valid for 24 hours. Writer called Ecologist at [employer] to inform him that the new Examination/Recommendation, as well as the IVC documents will be accepted. Denice Bors, AADC 04/21/2013 6:39 PM

## 2013-04-21 NOTE — ED Notes (Signed)
Reminded pt again that we need a urine sample. The cup is in the room . The sitter is also aware pt needs to collect a sample

## 2013-04-21 NOTE — ED Notes (Signed)
Patients belongings from containers and 2 valuables envelopes from safe and pt medications sent with [employer] when they transport pt to [facility].

## 2013-04-21 NOTE — Tx Team (Signed)
Initial Interdisciplinary Treatment Plan  PATIENT STRENGTHS: (choose at least two) Active sense of humor General fund of knowledge Motivation for treatment/growth Supportive family/friends  PATIENT STRESSORS: Loss of dog Traumatic event   PROBLEM LIST: Problem List/Patient Goals Date to be addressed Date deferred Reason deferred Estimated date of resolution  "I just want to get home to my kids" 04/21/2013     "I have dealt with depression since the age of 48." 04/21/2013                                                DISCHARGE CRITERIA:  Ability to meet basic life and health needs Improved stabilization in mood, thinking, and/or behavior Need for constant or close observation no longer present Safe-care adequate arrangements made  PRELIMINARY DISCHARGE PLAN: Attend aftercare/continuing care group Outpatient therapy Return to previous living arrangement  PATIENT/FAMIILY INVOLVEMENT: This treatment plan has been presented to and reviewed with the patient, Jenny Baldwin.  The patient and family have been given the opportunity to ask questions and make suggestions.  Angeline Slim M 04/21/2013, 8:26 PM

## 2013-04-21 NOTE — BH Assessment (Signed)
[facility] Assessment Progress Note   This clinician received a call from Kim at 00:14 regarding an teleassessment of patient being needed.  Kim did advise that it may be difficult to do the assessment because patient was in restraints and had been given sedatives.  Clinician advised that he would call back around 01:15 to see how patient was doing.  Called back at 00:45 and was informed by Selena Batten that patient was "out cold."  Did speak to Dr. Wilkie Aye regarding patient.  She confirmed that patient had been given haldol, geodon and benedryl to assist with anxiety and strident behavior.  Patient will be IVC'ed by Dr. Wilkie Aye.  Clinician did talk to Selena Batten again about the IVC procedure and she took care of getting everything to the magistrate.  Spoke to [employer] again at 06:30 and she confirmed that patient was still sleeping from sedatives given.  Advised that on-coming TTS shift would do teleassessment when patient is awake.

## 2013-04-21 NOTE — ED Provider Notes (Addendum)
Accepted at [facility]. by Dr. Sonia Baller R. Rubin Payor, MD 04/21/13 1617  Patient was sent back from [facility] because the first evaluation was filled out and properly. They stated that there was no one at the behavioral health hospital that could do a first evaluation. I have reexamined her and she now only wants to go home. She appears to be a risk for self and the paperwork was refilled out and sent back to the magistrate. The magistrate stated that they felt that the original paperwork was valid. She will be transferred [facility] if the bed is still available.  Juliet Rude. Rubin Payor, MD 04/21/13 [phone]

## 2013-04-21 NOTE — BH Assessment (Signed)
Tele Assessment Note   Jenny Baldwin is an 27 y.o. female. Pt presented to [facility] transported by husband and pt at the same endorsing SI. She was then put under IVC per chart review. Pt drowsy and falls asleep intermittently during assessment. She is irritable and answers most questions with one word answers and refuses to elaborate. Pt denies SI and HI. She sts that she wasn't trying to commit suicide when cut herself last night. Pt sts she cuts herself b/c "it kinda makes me feel good". She also says she doesn't remember cutting herself with lancets while in [facility] room last night. Pt denies [facility] and no delusions noted. She keeps her eyes closed during assessment. She sts she was inpt at [facility] but sts she doesn't remember reason for inpt hospitalization. Pt sts she doesn't know what current stressors she has. Pt not seeing psychiatrist currently. Per chart review, pt has hx of bipolar disorder. Pt endorses "crazy" mood with lability. She endorses tearfulness, isolating, loss of interest, and anger/irritability. BAL was 161 upon arrival. Pt denies substance abuse.   Axis I: Major Depressive Disorder, Recurrent, Severe without Psychotic Features Axis II: Deferred Axis III:  Past Medical History  Diagnosis Date  . Acid reflux   . Recurrent UTI     with pregnancy  . SVD (spontaneous vaginal delivery)     x 5  . Bipolar 1 disorder   . PTSD (post-traumatic stress disorder)   . Chronic right hip pain   . Prediabetes    Axis IV: other psychosocial or environmental problems, problems related to social environment and problems with primary support group Axis V: 31-40 impairment in reality testing  Past Medical History:  Past Medical History  Diagnosis Date  . Acid reflux   . Recurrent UTI     with pregnancy  . SVD (spontaneous vaginal delivery)     x 5  . Bipolar 1 disorder   . PTSD (post-traumatic stress disorder)   . Chronic right hip pain   . Prediabetes     Past Surgical History   Procedure Laterality Date  . No past surgeries    . Cesarean section  05/08/2012    Procedure: CESAREAN SECTION;  Surgeon: Brock Bad, MD;  Location: [facility];  Service: Obstetrics;  Laterality: N/A;  Primary Cesarean Section Delivery Girl @ 1555, Apgars 9/9    Family History:  Family History  Problem Relation Age of Onset  . Anesthesia problems Neg Hx   . Diabetes Mother   . Diabetes Maternal Aunt   . Diabetes Maternal Uncle     Social History:  reports that she quit smoking about 17 months ago. Her smoking use included Cigarettes. She has a 14 pack-year smoking history. She has never used smokeless tobacco. She reports that  drinks alcohol. She reports that she does not use illicit drugs.  Additional Social History:  Alcohol / Drug Use Pain Medications: see PTA meds lislt Prescriptions: see PTA meds lilst Over the Counter: see PTA meds list Longest period of sobriety (when/how long): pt denies substance abuse - BAL 161 upon arrival  CIWA: CIWA-Ar BP: 97/54 mmHg Pulse Rate: 81 COWS:    Allergies:  Allergies  Allergen Reactions  . Latex Itching  . Penicillins Swelling    Facial swelling  . Pineapple Itching    Itching around face & mouth    Home Medications:  (Not in a hospital admission)  OB/GYN Status:  No LMP recorded.  General Assessment Data Location  of Assessment: [facility] Assessment Services Is this a Tele or Face-to-Face Assessment?: Tele Assessment Is this an Initial Assessment or a Re-assessment for this encounter?: Initial Assessment Living Arrangements: Spouse/significant other;Children (husband & 6 kids) Can pt return to current living arrangement?: Yes Admission Status: Involuntary Is patient capable of signing voluntary admission?: Yes Transfer from: Acute Hospital Referral Source: Self/Family/Friend     [facility] Crisis Care Plan Living Arrangements: Spouse/significant other;Children (husband & 6 kids)  Education Status Is patient currently in  school?: No  Risk to self Suicidal Ideation: No Suicidal Intent: No Is patient at risk for suicide?: Yes Suicidal Plan?: No What has been your use of drugs/alcohol within the last 12 months?: pt denies abuse - BAL 161 upon arrival Previous Attempts/Gestures: No How many times?: 0 Other Self Harm Risks: none Intentional Self Injurious Behavior: Cutting Comment - Self Injurious Behavior: pt sts she cuts b/c "it kinda makes me feel good" Family Suicide History: No Recent stressful life event(s):  (pt denies stressors) Persecutory voices/beliefs?: No Depression: Yes Depression Symptoms: Tearfulness;Isolating;Loss of interest in usual pleasures;Feeling angry/irritable Substance abuse history and/or treatment for substance abuse?: No Suicide prevention information given to non-admitted patients: Not applicable  Risk to Others Homicidal Ideation: No Thoughts of Harm to Others: No Current Homicidal Intent: No Current Homicidal Plan: No Access to Homicidal Means: No Identified Victim: none History of harm to others?: No Assessment of Violence: None Noted Violent Behavior Description: pt denies hx violence Does patient have access to weapons?: No Criminal Charges Pending?: No Does patient have a court date: No  Psychosis Hallucinations: None noted Delusions: None noted  Mental Status Report Appear/Hygiene: Other (Comment) (appropriate) Eye Contact: Other (Comment) (eyes closed) Motor Activity: Freedom of movement Speech: Logical/coherent Level of Consciousness: Irritable;Drowsy;Sleeping Mood: Depressed;Labile Affect: Depressed;Appropriate to circumstance Anxiety Level: None Thought Processes: Coherent;Relevant Judgement: Unimpaired Orientation: Person;Situation;Time;Place Obsessive Compulsive Thoughts/Behaviors: None  Cognitive Functioning Concentration: Normal Memory: Recent Intact;Remote Intact IQ: Average Insight: Fair Impulse Control: Poor Appetite: Good Weight  Loss: 0 Weight Gain: 0 Sleep: No Change Total Hours of Sleep: 8 Vegetative Symptoms: None  ADLScreening [facility] Assessment Services) Patient's cognitive ability adequate to safely complete daily activities?: Yes Patient able to express need for assistance with ADLs?: Yes Independently performs ADLs?: Yes (appropriate for developmental age)  Prior Inpatient Therapy Prior Inpatient Therapy: Yes Prior Therapy Dates: unknown date Prior Therapy Facilty/Provider(s): [facility] Reason for Treatment: pt sts doesn't know reason  Prior Outpatient Therapy Prior Outpatient Therapy: No Prior Therapy Dates: na Prior Therapy Facilty/Provider(s): na Reason for Treatment: na  ADL Screening (condition at time of admission) Patient's cognitive ability adequate to safely complete daily activities?: Yes Is the patient deaf or have difficulty hearing?: No Does the patient have difficulty seeing, even when wearing glasses/contacts?: No Does the patient have difficulty concentrating, remembering, or making decisions?: No Patient able to express need for assistance with ADLs?: Yes Does the patient have difficulty dressing or bathing?: No Independently performs ADLs?: Yes (appropriate for developmental age) Does the patient have difficulty walking or climbing stairs?: No Weakness of Legs: None Weakness of Arms/Hands: None       Abuse/Neglect Assessment (Assessment to be complete while patient is alone) Physical Abuse: Denies Verbal Abuse: Denies Sexual Abuse: Denies Exploitation of patient/patient's resources: Denies Self-Neglect: Denies     Merchant navy officer (For Healthcare) Advance Directive: Patient does not have advance directive;Patient would not like information    Additional Information 1:1 In Past 12 Months?: No CIRT Risk: No Elopement Risk:  No Does patient have medical clearance?: Yes     Disposition:  Disposition Initial Assessment Completed for this Encounter:  Yes Disposition of Patient: Inpatient treatment program  [facility], Kahmya Pinkham P 04/21/2013 10:08 AM

## 2013-04-21 NOTE — ED Notes (Addendum)
Called [facility] for transport and spoke with Annabelle Harman
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
## 2013-04-21 NOTE — ED Notes (Signed)
PATIENT MEDICATIONS HAVE BEEN ORDERED FROM PHARMACY. THEY HAVE NOT BEEN RECEIVED FROM PHARMACY TO ADMINISTER TO PATIENT

## 2013-04-21 NOTE — ED Notes (Signed)
Patient with sitter at bedside.  Updated on care and eating dinner.  Calm at this time

## 2013-04-21 NOTE — ED Notes (Signed)
PATIENT HAS RETURNED FROM [facility] DUE TO PAPERWORK PROBLEMS. [facility] REPORTS THAT PT HUSBAND WAS AT [facility] AND TOOK ALL OF HER BELONGINGS HOME WITH HIM.

## 2013-04-21 NOTE — ED Notes (Signed)
Eric from [facility] called and advised we need order for telepsych and they will start at 1600 today.

## 2013-04-21 NOTE — ED Notes (Signed)
Patient ambulated to nurses station to make a phone call to her husband.  Patient tolerated well.

## 2013-04-22 ENCOUNTER — Encounter (HOSPITAL_COMMUNITY): Payer: Self-pay | Admitting: Psychiatry

## 2013-04-22 DIAGNOSIS — F39 Unspecified mood [affective] disorder: Secondary | ICD-10-CM

## 2013-04-22 DIAGNOSIS — F4310 Post-traumatic stress disorder, unspecified: Secondary | ICD-10-CM | POA: Diagnosis present

## 2013-04-22 LAB — HEMOGLOBIN A1C: Mean Plasma Glucose: 103 mg/dL (ref <117)

## 2013-04-22 LAB — URINE CULTURE: Colony Count: >=100000

## 2013-04-22 NOTE — [facility] Group Notes (Signed)
[facility] LCSW Group Therapy  04/22/2013 2:46 PM  Type of Therapy:  Group Therapy  Participation Level:  Active  Participation Quality:  Attentive  Affect:  Appropriate  Cognitive:  Alert  Insight:  Engaged  Engagement in Therapy:  Engaged  Modes of Intervention:  Discussion, Education, Exploration, Socialization and Support  Summary of Progress/Problems:  Finding Balance in Life. Today's group focused on defining balance in one's own words, identifying things that can knock one off balance, and exploring healthy ways to maintain balance in life. Group members were asked to provide an example of a time when they felt off balance, describe how they handled that situation,and process healthier ways to regain balance in the future. Group members were asked to share the most important tool for maintaining balance that they learned while at [facility] and how they plan to apply this method after discharge. Jenny Baldwin was attentive and engaged throughout the day's group. She presented with pleasant mood and excited affect. Jenny Baldwin stated that flashbacks and some types of music are her most difficult triggers to fight when dealing with mental illness. Jenny Baldwin acknowledged that although she does not have substance abuse issue, her mental illness has been a major issue for her, compounded by the stress of having six kids, limited income, past traumas, and limited resources. Jenny Baldwin shows progress in the group setting AEB her ability to actively participate and stay on topic. She shows improving insight AEB her ability to identify triggers and ways to reestablish balance in life "be honest with my doctor about what meds are and aren't working, reach out to my supports when I need help, and take care of my body by eating right and going back to [employer], Colen Eltzroth 04/22/2013, 2:46 PM
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
## 2013-04-22 NOTE — [facility] Suicide Risk Assessment (Addendum)
[facility] INPATIENT:  Family/Significant Other Suicide Prevention Education  Suicide Prevention Education:  Contact Attempts: Tova Vater (pt's husband) has been identified by the patient as the family member/significant other with whom the patient will be residing, and identified as the person(s) who will aid the patient in the event of a mental health crisis.  With written consent from the patient, two attempts were made to provide suicide prevention education, prior to and/or following the patient's discharge.  We were unsuccessful in providing suicide prevention education.  A suicide education pamphlet was given to the patient to share with family/significant other.  Date and time of first attempt: 04/22/13 10:45AM (voicemail left) Date and time of second attempt: 04/22/13 12:45PM   CSW will attempt to make contact with pt's husband prior to d/c for collateral information and to provide SPE. SPI pamphlet given to pt and she was encouraged to ask questions, talk about concerns, and share information with her support system.     9/5: CSW spoke with pt's husband. Completed SPE. He is comfortable with pt discharging today and feels that she is no longer a risk to herself and is not a risk to others. He will come at [facility] to pick her up. MD notified.   Smart, Tyshawn Keel 04/22/2013, 12:47 PM

## 2013-04-22 NOTE — Progress Notes (Signed)
D:  Patient up and visible in the milieu most of the shift.  Has attended and participated in some groups.  Requested Tylenol for hip pain around lunch time.  Denies suicidal ideation.  A:  Medications given as prescribed.  CIWA assessments done, negative.  Encouraged participation in groups.   R:  Cooperative with staff.  Interacting well with peers.  Progressing through detox.  Safety is maintained.
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
## 2013-04-22 NOTE — Progress Notes (Signed)
Adult Psychoeducational Group Note  Date:  04/22/2013 Time:  1:51 PM  Group Topic/Focus:  Building Self Esteem:   The Focus of this group is helping patients become aware of the effects of self-esteem on their lives, the things they and others do that enhance or undermine their self-esteem, seeing the relationship between their level of self-esteem and the choices they make and learning ways to enhance self-esteem.  Participation Level:  Did Not Attend  Jenny Baldwin 04/22/2013, 1:51 PM
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
## 2013-04-22 NOTE — Progress Notes (Signed)
Recreation Therapy Notes  Date: 09.04.2014 Time: 3:00pm Location: [address] Dayroom   Group Topic: Coping Skills  Goal Area(s) Addresses:  Patient will identify things they are grateful for. Patient will identify how being grateful can influence decision making.   Behavioral Response: Appropriate, Attentive, Engaged  Education:  Coping Skills, Discharge Planning  Education Outcome: Acknowledges understanding  Clinical Observations/Feedback: Patient contributed to opening discussion, sharing that she has a desire to stop cutting in the future. Patient engaged in activity, identifying things she is grateful, as well as sharing with the group. Majority of patient worksheet family focused, to include people, as well as activities. Patient made not contributions to wrap up discussion, however she appeared to actively listen, as she maintained appropriate eye contact with speaker and nodded her head in agreement with points of interest.    Jearl Klinefelter, LRT/CTRS  Jearl Klinefelter 04/22/2013 4:22 PM

## 2013-04-22 NOTE — Progress Notes (Signed)
Recreation Therapy Notes  Date: 09.04.2014 Time: 2:30pm Location: [address] Dayroom  Group Topic: Software engineer Activities (AAA)  Behavioral Response: Appropriate, Engaged, Attentive  Education: Coping Skill   Education Outcome: Acknowledges understanding  Clinical Observations/Feedback: Dog Team: [employer]. Patient interacted appropriately with peer, dog team, LRT and MHT.   Marykay Lex Iana Buzan, LRT/CTRS  Jearl Klinefelter 04/22/2013 4:37 PM

## 2013-04-22 NOTE — Progress Notes (Signed)
The focus of this group is to educate the patient on the purpose and policies of crisis stabilization and provide a format to answer questions about their admission.  The group details unit policies and expectations of patients while admitted. Did not attend.  

## 2013-04-23 DIAGNOSIS — F1020 Alcohol dependence, uncomplicated: Secondary | ICD-10-CM

## 2013-04-23 DIAGNOSIS — F10239 Alcohol dependence with withdrawal, unspecified: Secondary | ICD-10-CM

## 2013-04-23 DIAGNOSIS — F321 Major depressive disorder, single episode, moderate: Principal | ICD-10-CM

## 2013-04-23 MED ORDER — CARBAMAZEPINE ER 100 MG PO TB12: 300.0000 mg | ORAL_TABLET | Freq: Two times a day (BID) | ORAL | Status: DC

## 2013-04-23 MED ORDER — NALTREXONE HCL 50 MG PO TABS
25.0000 mg | ORAL_TABLET | Freq: Every day | ORAL | Status: DC
  Administered 2013-04-23: 25 mg via ORAL
  Filled 2013-04-23 (×3): qty 1
  Filled 2013-04-23: qty 2

## 2013-04-23 MED ORDER — NALTREXONE HCL 50 MG PO TABS: 25.0000 mg | ORAL_TABLET | Freq: Every day | ORAL | Status: DC

## 2013-04-23 MED ORDER — OMEPRAZOLE 20 MG PO CPDR: 20.0000 mg | DELAYED_RELEASE_CAPSULE | Freq: Two times a day (BID) | ORAL | Status: DC

## 2013-04-23 MED ORDER — CHOLECALCIFEROL 10 MCG (400 UNIT) PO TABS: 400.0000 [IU] | ORAL_TABLET | Freq: Two times a day (BID) | ORAL | Status: DC

## 2013-04-23 MED ORDER — FLUOXETINE HCL 20 MG PO CAPS: 20.0000 mg | ORAL_CAPSULE | Freq: Every day | ORAL | Status: DC

## 2013-04-23 MED ORDER — VALACYCLOVIR HCL 1 G PO TABS: 1000.0000 mg | ORAL_TABLET | Freq: Every day | ORAL | Status: DC

## 2013-04-23 MED ORDER — CARBAMAZEPINE ER 100 MG PO TB12
300.0000 mg | ORAL_TABLET | Freq: Two times a day (BID) | ORAL | Status: DC
  Filled 2013-04-23 (×2): qty 24

## 2013-04-23 MED ORDER — CARBAMAZEPINE ER 200 MG PO TB12
300.0000 mg | ORAL_TABLET | Freq: Two times a day (BID) | ORAL | Status: DC
  Filled 2013-04-23 (×4): qty 1

## 2013-04-23 MED ORDER — QUETIAPINE FUMARATE 50 MG PO TABS: 150.0000 mg | ORAL_TABLET | Freq: Every day | ORAL | Status: DC

## 2013-04-23 MED ORDER — CIPROFLOXACIN HCL 500 MG PO TABS: 500.0000 mg | ORAL_TABLET | Freq: Two times a day (BID) | ORAL | Status: DC

## 2013-04-23 NOTE — Progress Notes (Signed)
Patient ID: Jenny Baldwin, female   DOB: 23-Aug-1985, 27 y.o.   MRN: [id number] Patient discharged per physician order; patient denies SI/HI and A/V hallucinations; patient received samples, prescriptions, and copy of AVS after it was reviewed; patient had no other questions or concerns at this time; patient verbalized and signed that all her belongings were returned; patient left the unit ambulatory

## 2013-04-23 NOTE — Progress Notes (Signed)
Adult Psychoeducational Group Note  Date:  04/23/2013 Time:  12:10 PM  Group Topic/Focus:  Relapse Prevention Planning:   The focus of this group is to define relapse and discuss the need for planning to combat relapse.  Participation Level:  Active  Participation Quality:  Redirectable and Sharing  Affect:  Appropriate  Cognitive:  Appropriate  Insight: Appropriate  Engagement in Group:  Engaged  Modes of Intervention:  Activity  Additional Comments:  Pt actively participated in all group activities.   Tora Perches N 04/23/2013, 12:10 PM

## 2013-04-23 NOTE — Tx Team (Signed)
Interdisciplinary Treatment Plan Update (Adult)  Date: 04/23/2013   Time Reviewed: 9:28 AM  Progress in Treatment:  Attending groups: Yes Participating in groups:  Yes Taking medication as prescribed: Yes  Tolerating medication: Yes  Family/Significant othe contact made: No. Contact attempts made with pt's husband to complete SPE and for collateral information. CSW will attempt to contact husband today.  Patient understands diagnosis: Yes, AEB seeking treatment for SI, depression, and PTSD.  Discussing patient identified problems/goals with staff: Yes  Medical problems stabilized or resolved: Yes  Denies suicidal/homicidal ideation: Yes during group/self report.  Patient has not harmed self or Others: Yes  New problem(s) identified: n/a Discharge Plan or Barriers: Pt will followup at [facility] for med management and therapy.  Additional comments: Jenny Baldwin is an 27 y.o. female. Pt presented to [facility] transported by husband and pt at the same endorsing SI. She was then put under IVC per chart review. Pt drowsy and falls asleep intermittently during assessment. She is irritable and answers most questions with one word answers and refuses to elaborate. Pt denies SI and HI. She sts that she wasn't trying to commit suicide when cut herself last night. Pt sts she cuts herself b/c "it kinda makes me feel good". She also says she doesn't remember cutting herself with lancets while in [facility] room last night. Pt denies [facility] and no delusions noted. She keeps her eyes closed during assessment. She sts she was inpt at [facility] but sts she doesn't remember reason for inpt hospitalization. Pt sts she doesn't know what current stressors she has. Pt not seeing psychiatrist currently. Per chart review, pt has hx of bipolar disorder. Pt endorses "crazy" mood with lability. She endorses tearfulness, isolating, loss of interest, and anger/irritability  Reason for Continuation of Hospitalization: Medication management Mood  stabilization Estimated length of stay: 1-2 days (Pt IVC-per MD, husband must be reached prior to d/c) For review of initial/current patient goals, please see plan of care.  Attendees:  Patient:    Family:    Physician: Geoffery Lyons MD 04/23/2013 9:36 AM   Nursing:    Clinical Social Worker [employer], LCSWA  04/23/2013 9:36 AM   Other:     Other: Aggie PA 04/23/2013 9:36 AM   Other: Darden Dates Nurse CM 04/23/2013 9:36 AM   Other:    Scribe for Treatment Team:  [employer] LCSWA 04/23/2013 9:36 AM
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
## 2013-04-23 NOTE — Progress Notes (Signed)
D   Pt is pleasant and cooperative   She reports feeling anxious and is depressed   She has superficial scratches on both arms   She attended group this evening   She interacts well with others but minimally   She denies suicidal ideation at present A   Verbal support given   Medications administered and effectiveness monitored   Q 15 min checks R   Pt safe at present
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
## 2013-04-23 NOTE — [facility] Group Notes (Signed)
[facility] LCSW Aftercare Discharge Planning Group Note   04/23/2013 9:13 AM  Participation Quality:  Appropriate   Mood/Affect:  Appropriate  Depression Rating:  1  Anxiety Rating:  4  Thoughts of Suicide:  No Will you contract for safety?   NA  Current AVH:  No  Plan for Discharge/Comments:  Pt stated that she is very anxious to leave today. "You don't understand, I have six kids to take care of and my husband is looking for a job." Pt reports that she plans to followup at [facility] in [facility] for med management and therapy. CSW informed pt that MD would have to determine if she were able to leave. Contact attempt with pt's husband yesterday-CSW left voicemail. Pt informed CSW that her husband will call today.   Transportation Means: husband   Supports: husband/some family   Smart, Research scientist (physical sciences)

## 2013-04-23 NOTE — Progress Notes (Signed)
[facility] Adult Case Management Discharge Plan :  Will you be returning to the same living situation after discharge: Yes,  home with husband and kids At discharge, do you have transportation home?:Yes,  pt's husband to pick her up at 2PM Do you have the ability to pay for your medications:Yes,  Medicaid  Release of information consent forms completed and in the chart;  Patient's signature needed at discharge.  Patient to Follow up at: Follow-up Information   Follow up with [facility] On 04/27/2013. (Appt. at [facility] for hospital followup/medication management/assessment for therapy services. Make sure to bring discharge packet with you to this appt. )    Contact information:   [address] [place], [place] [address] Phone: [phone] Fax: [phone]      Patient denies SI/HI:   Yes,  during group/self report.    Safety Planning and Suicide Prevention discussed:  Yes,  SPE completed with both pt and her husband. SPI pamhplet provided to pt and she was encouraged to ask questions, talk about concerns, and share with her husband/support system.  Jenny Baldwin, Jenny Baldwin 04/23/2013, 10:45 AM

## 2013-04-23 NOTE — Progress Notes (Signed)
Patient ID: Jenny Baldwin, female   DOB: April 15, 1986, 27 y.o.   MRN: [id number] Patient discharged per physician order; patient denies SI/HI and A/V hallucinations; patient received samples, prescriptions, and copy of AVS after it was reviewed; patient had no other questions or concerns at this time; patient verbalized and signed that all her belongings were returned; patient left the unit ambulatory with her husband

## 2013-04-23 NOTE — [facility] Group Notes (Signed)
[facility] LCSW Group Therapy  04/23/2013 2:05 PM  Type of Therapy:  Group Therapy  Participation Level:  Active  Participation Quality:  Attentive  Affect:  Appropriate  Cognitive:  Appropriate  Insight:  Engaged  Engagement in Therapy:  Engaged  Modes of Intervention:  Discussion, Education, Exploration, Socialization and Support  Summary of Progress/Problems: Feelings around Relapse. Group members discussed the meaning of relapse and shared personal stories of relapse, how it affected them and others, and how they perceived themselves during this time. Group members were encouraged to identify triggers, warning signs and coping skills used when facing the possibility of relapse. Social supports were discussed and explored in detail. Post Acute Withdrawal Syndrome (handout provided) was introduced and examined. Pt's were encouraged to ask questions, talk about key points associated with PAWS, and process this information in terms of relapse prevention. Jenny Baldwin was attentive and engaged throughout today's group. She stated that to her, relapse meant "going back to a bad behavior." Jenny Baldwin stated that she does not have a substance abuse problem, but has issues with mental illness and was able to identify "stress with my husband and kids, not taking medicine, and drinking when I'm upset" as triggers for mental illness relapse. Jenny Baldwin shows progress in the group setting AEB her ability to identify triggers and ways for her to avoid mental illness relapse "be more open with my husband, take my medications, stay away from alcohol, and go to my therapy appts." She offered support and encouragement to other group members as well.    Smart, Kaiana Marion 04/23/2013, 2:05 PM

## 2013-04-23 NOTE — Progress Notes (Signed)
[facility] Group Notes:  (Nursing/MHT/Case Management/Adjunct)  Date:  04/22/2013 Time:  2100 Type of Therapy:  wrap up group  Participation Level:  Active  Participation Quality:  Appropriate, Attentive, Sharing and Supportive  Affect:  Appropriate  Cognitive:  Appropriate  Insight:  Good  Engagement in Group:  Engaged  Modes of Intervention:  Clarification, Education and Support  Summary of Progress/Problems:  Jenny Baldwin 04/23/2013, 12:56 AM

## 2013-04-28 NOTE — Progress Notes (Signed)
Patient Discharge Instructions:  After Visit Summary (AVS):   Faxed to:  04/28/13 Discharge Summary Note:   Faxed to:  04/28/13 Psychiatric Admission Assessment Note:   Faxed to:  04/28/13 Suicide Risk Assessment - Discharge Assessment:   Faxed to:  04/28/13 Faxed/Sent to the Next Level Care provider:  04/28/13 Faxed to [facility] @ [phone]  Jerelene Redden, 04/28/2013, 3:04 PM

## 2013-06-29 ENCOUNTER — Other Ambulatory Visit: Payer: Self-pay | Admitting: Obstetrics

## 2013-12-17 DIAGNOSIS — Z720 Tobacco use: Secondary | ICD-10-CM | POA: Insufficient documentation

## 2014-01-05 DIAGNOSIS — E559 Vitamin D deficiency, unspecified: Secondary | ICD-10-CM | POA: Insufficient documentation

## 2014-01-05 DIAGNOSIS — F319 Bipolar disorder, unspecified: Secondary | ICD-10-CM | POA: Insufficient documentation

## 2014-02-09 ENCOUNTER — Other Ambulatory Visit: Payer: Self-pay | Admitting: Obstetrics

## 2014-02-09 NOTE — Telephone Encounter (Signed)
Please advise 

## 2014-06-20 ENCOUNTER — Encounter (HOSPITAL_COMMUNITY): Payer: Self-pay | Admitting: Psychiatry

## 2015-01-13 ENCOUNTER — Other Ambulatory Visit: Payer: Self-pay

## 2015-01-13 ENCOUNTER — Encounter: Payer: Self-pay | Admitting: Emergency Medicine

## 2015-01-13 ENCOUNTER — Emergency Department
Admission: EM | Admit: 2015-01-13 | Discharge: 2015-01-14 | Disposition: A | Discharge status: Home / Self Care | Payer: Medicaid Other | Attending: Emergency Medicine | Admitting: Emergency Medicine

## 2015-01-13 DIAGNOSIS — Z720 Tobacco use: Secondary | ICD-10-CM | POA: Diagnosis not present

## 2015-01-13 DIAGNOSIS — Y9289 Other specified places as the place of occurrence of the external cause: Secondary | ICD-10-CM | POA: Diagnosis not present

## 2015-01-13 DIAGNOSIS — Y998 Other external cause status: Secondary | ICD-10-CM | POA: Diagnosis not present

## 2015-01-13 DIAGNOSIS — T391X2A Poisoning by 4-Aminophenol derivatives, intentional self-harm, initial encounter: Secondary | ICD-10-CM | POA: Insufficient documentation

## 2015-01-13 DIAGNOSIS — T447X2A Poisoning by beta-adrenoreceptor antagonists, intentional self-harm, initial encounter: Secondary | ICD-10-CM | POA: Diagnosis not present

## 2015-01-13 DIAGNOSIS — Z91040 Latex allergy status: Secondary | ICD-10-CM | POA: Insufficient documentation

## 2015-01-13 DIAGNOSIS — F4310 Post-traumatic stress disorder, unspecified: Secondary | ICD-10-CM | POA: Diagnosis present

## 2015-01-13 DIAGNOSIS — T450X2A Poisoning by antiallergic and antiemetic drugs, intentional self-harm, initial encounter: Secondary | ICD-10-CM | POA: Insufficient documentation

## 2015-01-13 DIAGNOSIS — Z79899 Other long term (current) drug therapy: Secondary | ICD-10-CM | POA: Insufficient documentation

## 2015-01-13 DIAGNOSIS — Z880 Allergy status to penicillin: Secondary | ICD-10-CM | POA: Insufficient documentation

## 2015-01-13 DIAGNOSIS — T502X2A Poisoning by carbonic-anhydrase inhibitors, benzothiadiazides and other diuretics, intentional self-harm, initial encounter: Secondary | ICD-10-CM | POA: Diagnosis not present

## 2015-01-13 DIAGNOSIS — X838XXA Intentional self-harm by other specified means, initial encounter: Secondary | ICD-10-CM | POA: Insufficient documentation

## 2015-01-13 DIAGNOSIS — Y9389 Activity, other specified: Secondary | ICD-10-CM | POA: Insufficient documentation

## 2015-01-13 DIAGNOSIS — T1491 Suicide attempt: Secondary | ICD-10-CM | POA: Diagnosis present

## 2015-01-13 DIAGNOSIS — T1491XA Suicide attempt, initial encounter: Secondary | ICD-10-CM

## 2015-01-13 LAB — COMPREHENSIVE METABOLIC PANEL
ALBUMIN: 4.1 g/dL (ref 3.5–5.0)
ALT: 25 U/L (ref 14–54)
AST: 26 U/L (ref 15–41)
Alkaline Phosphatase: 46 U/L (ref 38–126)
Anion gap: 6 (ref 5–15)
BILIRUBIN TOTAL: 0.4 mg/dL (ref 0.3–1.2)
BUN: 15 mg/dL (ref 6–20)
CALCIUM: 9 mg/dL (ref 8.9–10.3)
CHLORIDE: 111 mmol/L (ref 101–111)
CO2: 22 mmol/L (ref 22–32)
CREATININE: 0.92 mg/dL (ref 0.44–1.00)
GFR calc Af Amer: >60 mL/min (ref >60)
GFR calc non Af Amer: >60 mL/min (ref >60)
Glucose, Bld: 118 mg/dL — ABNORMAL HIGH (ref 65–99)
POTASSIUM: 3.2 mmol/L — AB (ref 3.5–5.1)
SODIUM: 139 mmol/L (ref 135–145)
Total Protein: 7.7 g/dL (ref 6.5–8.1)

## 2015-01-13 LAB — URINE DRUG SCREEN, QUALITATIVE (ARMC ONLY)
Amphetamines, Ur Screen: NOT DETECTED
BENZODIAZEPINE, UR SCRN: NOT DETECTED
Barbiturates, Ur Screen: NOT DETECTED
CANNABINOID 50 NG, UR ~~LOC~~: NOT DETECTED
COCAINE METABOLITE, UR ~~LOC~~: NOT DETECTED
MDMA (Ecstasy)Ur Screen: NOT DETECTED
Methadone Scn, Ur: NOT DETECTED
Opiate, Ur Screen: NOT DETECTED
Phencyclidine (PCP) Ur S: NOT DETECTED
Tricyclic, Ur Screen: NOT DETECTED

## 2015-01-13 LAB — CBC
HCT: 42 % (ref 35.0–47.0)
HEMOGLOBIN: 14.4 g/dL (ref 12.0–16.0)
MCH: 32.4 pg (ref 26.0–34.0)
MCHC: 34.2 g/dL (ref 32.0–36.0)
MCV: 94.7 fL (ref 80.0–100.0)
Platelets: 263 10*3/uL (ref 150–440)
RBC: 4.43 MIL/uL (ref 3.80–5.20)
RDW: 12.6 % (ref 11.5–14.5)
WBC: 8.2 10*3/uL (ref 3.6–11.0)

## 2015-01-13 LAB — SALICYLATE LEVEL: Salicylate Lvl: <4 mg/dL (ref 2.8–30.0)

## 2015-01-13 LAB — ACETAMINOPHEN LEVEL
ACETAMINOPHEN (TYLENOL), SERUM: 25 ug/mL (ref 10–30)
ACETAMINOPHEN (TYLENOL), SERUM: 12 ug/mL (ref 10–30)
Acetaminophen (Tylenol), Serum: 49 ug/mL — ABNORMAL HIGH (ref 10–30)

## 2015-01-13 LAB — ETHANOL

## 2015-01-13 LAB — GLUCOSE, CAPILLARY: Glucose-Capillary: 92 mg/dL (ref 65–99)

## 2015-01-13 MED ORDER — LEVOFLOXACIN IN D5W 750 MG/150ML IV SOLN
INTRAVENOUS | Status: AC
  Filled 2015-01-13: qty 150

## 2015-01-13 MED ORDER — CARBAMAZEPINE 200 MG PO TABS: 300.0000 mg | ORAL_TABLET | Freq: Two times a day (BID) | ORAL | Status: DC

## 2015-01-13 MED ORDER — PANTOPRAZOLE SODIUM 40 MG PO TBEC: 40.0000 mg | DELAYED_RELEASE_TABLET | Freq: Every day | ORAL | Status: DC

## 2015-01-13 MED ORDER — FLUOXETINE HCL 20 MG PO CAPS: 20.0000 mg | ORAL_CAPSULE | Freq: Every day | ORAL | Status: DC

## 2015-01-13 MED ORDER — NALTREXONE HCL 50 MG PO TABS
25.0000 mg | ORAL_TABLET | Freq: Every day | ORAL | Status: DC
  Filled 2015-01-13 (×2): qty 1

## 2015-01-13 MED ORDER — ALUM & MAG HYDROXIDE-SIMETH 200-200-20 MG/5ML PO SUSP
30.0000 mL | Freq: Once | ORAL | Status: AC
  Administered 2015-01-13: 30 mL via ORAL

## 2015-01-13 MED ORDER — ONDANSETRON HCL 4 MG/2ML IJ SOLN
INTRAMUSCULAR | Status: AC
  Administered 2015-01-13: 4 mg via INTRAVENOUS
  Filled 2015-01-13: qty 2

## 2015-01-13 MED ORDER — QUETIAPINE FUMARATE 25 MG PO TABS
ORAL_TABLET | ORAL | Status: AC
Start: 2015-01-13 — End: 2015-01-13
  Administered 2015-01-13: 150 mg via ORAL
  Filled 2015-01-13: qty 6

## 2015-01-13 MED ORDER — ALUM & MAG HYDROXIDE-SIMETH 200-200-20 MG/5ML PO SUSP
ORAL | Status: AC
  Administered 2015-01-13: 30 mL via ORAL
  Filled 2015-01-13: qty 30

## 2015-01-13 MED ORDER — QUETIAPINE FUMARATE 25 MG PO TABS
150.0000 mg | ORAL_TABLET | Freq: Every day | ORAL | Status: DC
  Administered 2015-01-13: 150 mg via ORAL

## 2015-01-13 MED ORDER — IBUPROFEN 400 MG PO TABS
ORAL_TABLET | ORAL | Status: AC
  Administered 2015-01-13: 400 mg
  Filled 2015-01-13: qty 1

## 2015-01-13 MED ORDER — BISMUTH SUBSALICYLATE 262 MG/15ML PO SUSP
30.0000 mL | Freq: Once | ORAL | Status: AC
  Administered 2015-01-13: 30 mL via ORAL
  Filled 2015-01-13: qty 118

## 2015-01-13 NOTE — ED Notes (Signed)
Patient assigned to appropriate care area. Patient oriented to unit/care area: Informed that, for their safety, care areas are designed for safety and monitored by security cameras at all times; and visiting hours explained to patient. Patient verbalizes understanding, and verbal contract for safety obtained. 

## 2015-01-13 NOTE — ED Notes (Signed)
15 min safety checks required

## 2015-01-13 NOTE — ED Notes (Signed)
Patient states she is unsure if she wants to hurt herself at this time.  Sitter at bedside.

## 2015-01-13 NOTE — ED Notes (Signed)
CBG- 92 

## 2015-01-13 NOTE — ED Provider Notes (Signed)
[facility] Emergency Department Provider Note  Time seen: 11:02 AM  I have reviewed the triage vital signs and the nursing notes.   HISTORY  Chief Complaint Drug Overdose and Suicide Attempt    HPI Jenny Baldwin is a 29 y.o. female presents the emergency department after an intentional overdose. According to the patient her mother passed away approximately one year ago and this is very sad for her. The patient was thinking about that today and decided that she wanted to be with her mom so she took 6 Midol tablets, several tablespoons of Benadryl, 6 100 mg metoprolol tablets, approximately one hour ago. Denies any vomiting, patient is nauseated. Denies any alcohol intake. Denies any recreational drugs. Patient continues to state she is depressed and wishes to be with her mom.    Past Medical History  Diagnosis Date  . Acid reflux   . Recurrent UTI     with pregnancy  . SVD (spontaneous vaginal delivery)     x 5  . Bipolar 1 disorder   . PTSD (post-traumatic stress disorder)   . Chronic right hip pain   . Prediabetes     Patient Active Problem List   Diagnosis Date Noted  . PTSD (post-traumatic stress disorder) 04/22/2013  . Unspecified episodic mood disorder 04/22/2013  . Cesarean delivery, without mention of indication, delivered, with or without mention of antepartum condition 05/11/2012    Past Surgical History  Procedure Laterality Date  . No past surgeries    . Cesarean section  05/08/2012    Procedure: CESAREAN SECTION;  Surgeon: Brock Bad, MD;  Location: [facility];  Service: Obstetrics;  Laterality: N/A;  Primary Cesarean Section Delivery Girl @ 1555, Apgars 9/9    Current Outpatient Rx  Name  Route  Sig  Dispense  Refill  . carbamazepine (TEGRETOL XR) 100 MG 12 hr tablet   Oral   Take 3 tablets (300 mg total) by mouth 2 (two) times daily. For mood stabilization   90 tablet   0   . cholecalciferol (VITAMIN D) 400 UNITS TABS tablet  Oral   Take 1 tablet (400 Units total) by mouth 2 (two) times daily. For low calcium   30 each   0   . ciprofloxacin (CIPRO) 500 MG tablet   Oral   Take 1 tablet (500 mg total) by mouth 2 (two) times daily. For urinary tract infection   2 tablet   0   . FLUoxetine (PROZAC) 20 MG capsule   Oral   Take 1 capsule (20 mg total) by mouth daily. For depression   30 capsule   0   . naltrexone (DEPADE) 50 MG tablet   Oral   Take 0.5 tablets (25 mg total) by mouth daily. For alcoholism   30 tablet   0   . omeprazole (PRILOSEC) 20 MG capsule   Oral   Take 1 capsule (20 mg total) by mouth 2 (two) times daily. For acid reflux         . QUEtiapine (SEROQUEL) 50 MG tablet   Oral   Take 3 tablets (150 mg total) by mouth at bedtime. For mood control   90 tablet   0   . valACYclovir (VALTREX) 1000 MG tablet      take 1 tablet by mouth once daily   30 tablet   10     Allergies Latex; Penicillins; and Pineapple  Family History  Problem Relation Age of Onset  . Anesthesia problems Neg  Hx   . Diabetes Mother   . Diabetes Maternal Aunt   . Diabetes Maternal Uncle     Social History History  Substance Use Topics  . Smoking status: Current Every Day Smoker -- 1.00 packs/day for 14 years    Types: Cigarettes    Last Attempt to Quit: 11/08/2011  . Smokeless tobacco: Never Used  . Alcohol Use: 1.2 oz/week    2 Glasses of wine per week    Review of Systems Constitutional: Negative for fever. Positive for SI. Cardiovascular: Negative for chest pain. Respiratory: Negative for shortness of breath. Gastrointestinal: Negative for abdominal pain, positive for nausea. Negative for vomiting Musculoskeletal: Negative for back pain. Skin: Negative for rash.  10-point ROS otherwise negative.  ____________________________________________   PHYSICAL EXAM:  VITAL SIGNS: ED Triage Vitals  Enc Vitals Group     BP 01/13/15 1029 135/80 mmHg     Pulse Rate 01/13/15 1029 84      Resp 01/13/15 1029 18     Temp 01/13/15 1029 98.2 F (36.8 C)     Temp Source 01/13/15 1029 Oral     SpO2 01/13/15 1029 98 %     Weight 01/13/15 1029 194 lb (87.998 kg)     Height 01/13/15 1029 5\' 2"  (1.575 m)     Head Cir --      Peak Flow --      Pain Score 01/13/15 1030 10     Pain Loc --      Pain Edu? --      Excl. in GC? --     Constitutional: Alert and oriented. Well appearing, awake, does not appear overly somnolent. Answering all questions appropriately. Eyes: Normal exam ENT   Head: Normocephalic and atraumatic. Cardiovascular: Normal rate, regular rhythm. No murmurs. Heart rate around 70 bpm Respiratory: Normal respiratory effort without tachypnea nor retractions. Breath sounds are clear  Gastrointestinal: Soft and nontender. No distention.   Musculoskeletal: Nontender with normal range of motion in all extremities. Neurologic:  Normal speech and language. No gross focal neurologic deficits  Skin:  Skin is warm, dry and intact.  Psychiatric: Mood and affect are normal. Speech and behavior are normal.   ____________________________________________    EKG  EKG reviewed and interpreted by myself shows market sinus bradycardia at 49 bpm, narrow QRS, normal axis, normal intervals, no ST changes noted.   ____________________________________________      INITIAL IMPRESSION / ASSESSMENT AND PLAN / ED COURSE  Pertinent labs & imaging results that were available during my care of the patient were reviewed by me and considered in my medical decision making (see chart for details).  29 year old female with a past medical history of bipolar/manic depression, PTSD, anxiety presents after a suicide attempt. We will check labs, IV hydrate, monitor the patient on a cardiac monitor. We will place a sitter in the room with the patient. I have involuntarily committed the patient, and we will have psychiatry see her.  ----------------------------------------- 1:48 PM on  01/13/2015 -----------------------------------------  Patient remains alert and oriented, somewhat nauseated. He was Zofran. Patient's heart rate has remained in the 48-55 range, blood pressure remained stable. Patient is now approximately 4-5 hours status post ingestion, we will repeat an acetaminophen level at this time.  Patient care signed out to Dr. Darnelle CatalanMalinda  FINAL CLINICAL IMPRESSION(S) / ED DIAGNOSES  Intentional overdose Suicide attempt   Minna AntisKevin Willow Reczek, MD 01/13/15 1424

## 2015-01-13 NOTE — ED Notes (Signed)
BEHAVIORAL HEALTH ROUNDING Patient sleeping: No. Patient alert and oriented: yes Behavior appropriate: Yes.  ; If no, describe:  Nutrition and fluids offered: Yes  Toileting and hygiene offered: Yes  Sitter present: yes Law enforcement present: Yes  

## 2015-01-13 NOTE — ED Notes (Signed)
BEHAVIORAL HEALTH ROUNDING Patient sleeping: No. Patient alert and oriented: yes Behavior appropriate: Yes.  ; If no, describe:  Nutrition and fluids offered: Yes  Toileting and hygiene offered: Yes  Sitter present: Yes Law enforcement present: Yes  

## 2015-01-13 NOTE — ED Notes (Signed)
ENVIRONMENTAL ASSESSMENT Potentially harmful objects out of patient reach: Yes.   Personal belongings secured: Yes.   Patient dressed in hospital provided attire only: Yes.   Plastic bags out of patient reach: Yes.   Patient care equipment (cords, cables, call bells, lines, and drains) shortened, removed, or accounted for: Yes.   Equipment and supplies removed from bottom of stretcher: Yes.   Potentially toxic materials out of patient reach: Yes.   Sharps container removed or out of patient reach: Yes.     BEHAVIORAL HEALTH ROUNDING Patient sleeping: No. Patient alert and oriented: yes Behavior appropriate: Yes.  ; If no, describe:  Nutrition and fluids offered: Yes  Toileting and hygiene offered: Yes  Sitter present: yes Law enforcement present: Yes    
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
## 2015-01-13 NOTE — ED Notes (Signed)
3 Tylenol also taken at the same time other medications were taken.

## 2015-01-13 NOTE — ED Notes (Signed)
ENVIRONMENTAL ASSESSMENT Potentially harmful objects out of patient reach: Yes.   Personal belongings secured: Yes.   Patient dressed in hospital provided attire only: Yes.   Plastic bags out of patient reach: Yes.   Patient care equipment (cords, cables, call bells, lines, and drains) shortened, removed, or accounted for: Yes.  Patient currently on cardiac monitoring, sitter at bedside.  Equipment and supplies removed from bottom of stretcher: Yes.   Potentially toxic materials out of patient reach: Yes.   Sharps container removed or out of patient reach: Yes.

## 2015-01-13 NOTE — BHH Counselor (Signed)
[employer] Enrollment completed and submitted. STR# [id number].

## 2015-01-13 NOTE — ED Notes (Signed)
ED BHU PLACEMENT JUSTIFICATION Is the patient under IVC or is there intent for IVC: Yes.   Is the patient medically cleared: Yes.   Is there vacancy in the ED BHU: Yes.   Is the population mix appropriate for patient: No. Is the patient awaiting placement in inpatient or outpatient setting: Yes.   Has the patient had a psychiatric consult: Yes.   Survey of unit performed for contraband, proper placement and condition of furniture, tampering with fixtures in bathroom, shower, and each patient room: Yes.  ; Findings:  APPEARANCE/BEHAVIOR calm and cooperative NEURO ASSESSMENT Orientation: time, place and person Hallucinations: No.None noted (Hallucinations) Speech: Normal Gait: normal RESPIRATORY ASSESSMENT Normal expansion.  Clear to auscultation.  No rales, rhonchi, or wheezing. CARDIOVASCULAR ASSESSMENT regular rate and rhythm, S1, S2 normal, no murmur, click, rub or gallop GASTROINTESTINAL ASSESSMENT soft, nontender, BS WNL, no r/g EXTREMITIES normal strength, tone, and muscle mass PLAN OF CARE Provide calm/safe environment. Vital signs assessed twice daily. ED BHU Assessment once each 12-hour shift. Collaborate with intake RN daily or as condition indicates. Assure the ED provider has rounded once each shift. Provide and encourage hygiene. Provide redirection as needed. Assess for escalating behavior; address immediately and inform ED provider.  Assess family dynamic and appropriateness for visitation as needed: Yes.  ; If necessary, describe findings: 3 Educate the patient/family about BHU procedures/visitation: Yes.  ; If necessary, describe findings:

## 2015-01-13 NOTE — ED Notes (Signed)
Sitter at bedside.

## 2015-01-13 NOTE — ED Notes (Addendum)
Patient states she took 6 Metoprolol 100mg  pills in attempt to harm self approx 30 mins PTA. Patient also c/o nausea. States her mother died approx one year ago on her son's birthday and she "just wants to be with my mom". Patient tearful in triage. Complaining of nausea. States she has h/o cutting self, no suicide attempts. States she was placed on medication, but no longer takes it. Also states she took unknown amount of liquid children's benadryl and 6 Midol tablets.

## 2015-01-13 NOTE — BHH Counselor (Signed)
[facility] by Dr. Toni Amendlapacs. Attending Physician will be Dr. Ardyth HarpsHernandez.  [facility] [place].  Intake Paper Work has been signed and placed on pt. chart.

## 2015-01-13 NOTE — Consult Note (Signed)
[facility] Consult   Reason for Consult:  Consult for this patient who took an overdose of multiple medications Referring Physician:  malinda Patient Identification: Jenny Baldwin MRN:  [id number] Principal Diagnosis: <principal problem not specified> Diagnosis:   Patient Active Problem List   Diagnosis Date Noted  . PTSD (post-traumatic stress disorder) [F43.10] 04/22/2013  . 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 Cesarean delivery, without mention of indication, delivered, with or without mention of antepartum condition [O82] 05/11/2012    Total Time spent with patient: 1 hour  Subjective:   Jenny Baldwin is a 29 y.o. female patient admitted with "I took a bunch of pills". Patient indicates a suicide attempt today.Marland Kitchen  HPI:  Information from the patient and the chart. Patient was brought in after taking an overdose of multiple pills. She said she was at home when she took a handful of several different kinds of medication. After doing it she told her husband. She indicated that she was wishing that she could die and was suicidal at the time. She has a history of depression and has been feeling more depressed recently. Sleep has been poor. Appetite poor. Increased irritability. Major stress acutely is that we are near the anniversary of the death of her mother. Denies that she is abusing any drugs. Says that she has treatment through her family practice doctor. Appears to be currently taking fluoxetine.  Past psychiatric history: History of depression. History of past suicide attempts. History of more than one psychiatric hospitalization in the past. She has been diagnosed with posttraumatic stress disorder. No clear history of psychosis. Patient can't remember all the medication she is been treated with in the past.  Social history: Patient lives with her husband and 63 young children. She does not work outside the home. Indicates that there is some conflict with  her husband but won't go into details about it.  Substance abuse history: Denies that she abuses alcohol or drugs  Medical history: Denies any significant ongoing medical problems  Current medication: Prozac 20 mg per day, Tegretol 100 mg twice a day omeprazole 20 mg per day Seroquel when necessary HPI Elements:   Quality:  Suicidal ideation. Severity:  Severe enough to act on it. Timing:  Yesterday and this morning. Duration:  Chronic depression worse recently. Context:  Anniversary of the death of her mother. Some kind of conflict at home..  Past Medical History:  Past Medical History  Diagnosis Date  . Acid reflux   . Recurrent UTI     with pregnancy  . SVD (spontaneous vaginal delivery)     x 5  . Bipolar 1 disorder   . PTSD (post-traumatic stress disorder)   . Chronic right hip pain   . Prediabetes     Past Surgical History  Procedure Laterality Date  . No past surgeries    . Cesarean section  05/08/2012    Procedure: CESAREAN SECTION;  Surgeon: Shelly Bombard, MD;  Location: [facility];  Service: Obstetrics;  Laterality: N/A;  Primary Cesarean Section Delivery Girl @ [phone], Apgars 9/9   Family History:  Family History  Problem Relation Age of Onset  . Anesthesia problems Neg Hx   . Diabetes Mother   . Diabetes Maternal Aunt   . Diabetes Maternal Uncle    Social History:  History  Alcohol Use  . 1.2 oz/week  . 2 Glasses of wine per week     History  Drug Use No    History  Social History  . Marital Status: Married    Spouse Name: N/A  . Number of Children: N/A  . Years of Education: N/A   Social History Main Topics  . Smoking status: Current Every Day Smoker -- 1.00 packs/day for 14 years    Types: Cigarettes    Last Attempt to Quit: 11/08/2011  . Smokeless tobacco: Never Used  . Alcohol Use: 1.2 oz/week    2 Glasses of wine per week  . Drug Use: No  . Sexual Activity: Yes    Birth Control/ Protection: None   Other Topics Concern  . None    Social History Narrative   Additional Social History:                          Allergies:   Allergies  Allergen Reactions  . Latex Itching  . Penicillins Swelling    Facial swelling  . Pineapple Itching    Itching around face & mouth    Labs:  Results for orders placed or performed during the hospital encounter of 01/13/15 (from the past 48 hour(s))  CBC     Status: None   Collection Time: 01/13/15 10:42 AM  Result Value Ref Range   WBC 8.2 3.6 - 11.0 K/uL   RBC 4.43 3.80 - 5.20 MIL/uL   Hemoglobin 14.4 12.0 - 16.0 g/dL   HCT 42.0 35.0 - 47.0 %   MCV 94.7 80.0 - 100.0 fL   MCH 32.4 26.0 - 34.0 pg   MCHC 34.2 32.0 - 36.0 g/dL   RDW 12.6 11.5 - 14.5 %   Platelets 263 150 - 440 K/uL  Comprehensive metabolic panel     Status: Abnormal   Collection Time: 01/13/15 10:42 AM  Result Value Ref Range   Sodium 139 135 - 145 mmol/L   Potassium 3.2 (L) 3.5 - 5.1 mmol/L   Chloride 111 101 - 111 mmol/L   CO2 22 22 - 32 mmol/L   Glucose, Bld 118 (H) 65 - 99 mg/dL   BUN 15 6 - 20 mg/dL   Creatinine, Ser 0.92 0.44 - 1.00 mg/dL   Calcium 9.0 8.9 - 10.3 mg/dL   Total Protein 7.7 6.5 - 8.1 g/dL   Albumin 4.1 3.5 - 5.0 g/dL   AST 26 15 - 41 U/L   ALT 25 14 - 54 U/L   Alkaline Phosphatase 46 38 - 126 U/L   Total Bilirubin 0.4 0.3 - 1.2 mg/dL   GFR calc non Af Amer >60 >60 mL/min   GFR calc Af Amer >60 >60 mL/min    Comment: (NOTE) The eGFR has been calculated using the CKD EPI equation. This calculation has not been validated in all clinical situations. eGFR's persistently <60 mL/min signify possible Chronic Kidney Disease.    Anion gap 6 5 - 15  Ethanol (ETOH)     Status: None   Collection Time: 01/13/15 10:42 AM  Result Value Ref Range   Alcohol, Ethyl (B) <5 <5 mg/dL    Comment:        LOWEST DETECTABLE LIMIT FOR SERUM ALCOHOL IS 11 mg/dL FOR MEDICAL PURPOSES ONLY   Acetaminophen level     Status: None   Collection Time: 01/13/15 10:42 AM  Result Value Ref  Range   Acetaminophen (Tylenol), Serum 25 10 - 30 ug/mL    Comment:        THERAPEUTIC CONCENTRATIONS VARY SIGNIFICANTLY. A RANGE OF 10-30 ug/mL MAY BE AN EFFECTIVE CONCENTRATION  FOR MANY PATIENTS. HOWEVER, SOME ARE BEST TREATED AT CONCENTRATIONS OUTSIDE THIS RANGE. ACETAMINOPHEN CONCENTRATIONS >150 ug/mL AT 4 HOURS AFTER INGESTION AND >50 ug/mL AT 12 HOURS AFTER INGESTION ARE OFTEN ASSOCIATED WITH TOXIC REACTIONS.   Salicylate level     Status: None   Collection Time: 01/13/15 10:42 AM  Result Value Ref Range   Salicylate Lvl <4.6 2.8 - 30.0 mg/dL  Urine Drug Screen, [facility])     Status: None   Collection Time: 01/13/15 10:43 AM  Result Value Ref Range   Tricyclic, Ur Screen NONE DETECTED NONE DETECTED   Amphetamines, Ur Screen NONE DETECTED NONE DETECTED   MDMA (Ecstasy)Ur Screen NONE DETECTED NONE DETECTED   Cocaine Metabolite,Ur [place] NONE DETECTED NONE DETECTED   Opiate, Ur Screen NONE DETECTED NONE DETECTED   Phencyclidine (PCP) Ur S NONE DETECTED NONE DETECTED   Cannabinoid 50 Ng, Ur  NONE DETECTED NONE DETECTED   Barbiturates, Ur Screen NONE DETECTED NONE DETECTED   Benzodiazepine, Ur Scrn NONE DETECTED NONE DETECTED   Methadone Scn, Ur NONE DETECTED NONE DETECTED    Comment: (NOTE) 962  Tricyclics, urine               Cutoff 1000 ng/mL 200  Amphetamines, urine             Cutoff 1000 ng/mL 300  MDMA (Ecstasy), urine           Cutoff 500 ng/mL 400  Cocaine Metabolite, urine       Cutoff 300 ng/mL 500  Opiate, urine                   Cutoff 300 ng/mL 600  Phencyclidine (PCP), urine      Cutoff 25 ng/mL 700  Cannabinoid, urine              Cutoff 50 ng/mL 800  Barbiturates, urine             Cutoff 200 ng/mL 900  Benzodiazepine, urine           Cutoff 200 ng/mL 1000 Methadone, urine                Cutoff 300 ng/mL 1100 1200 The urine drug screen provides only a preliminary, unconfirmed 1300 analytical test result and should not be used for  non-medical 1400 purposes. Clinical consideration and professional judgment should 1500 be applied to any positive drug screen result due to possible 1600 interfering substances. A more specific alternate chemical method 1700 must be used in order to obtain a confirmed analytical result.  1800 Gas chromato graphy / mass spectrometry (GC/MS) is the preferred 1900 confirmatory method.   Acetaminophen level     Status: Abnormal   Collection Time: 01/13/15  2:10 PM  Result Value Ref Range   Acetaminophen (Tylenol), Serum 49 (H) 10 - 30 ug/mL    Comment:        THERAPEUTIC CONCENTRATIONS VARY SIGNIFICANTLY. A RANGE OF 10-30 ug/mL MAY BE AN EFFECTIVE CONCENTRATION FOR MANY PATIENTS. HOWEVER, SOME ARE BEST TREATED AT CONCENTRATIONS OUTSIDE THIS RANGE. ACETAMINOPHEN CONCENTRATIONS >150 ug/mL AT 4 HOURS AFTER INGESTION AND >50 ug/mL AT 12 HOURS AFTER INGESTION ARE OFTEN ASSOCIATED WITH TOXIC REACTIONS.     Vitals: Blood pressure 96/60, pulse 50, temperature 98.2 F (36.8 C), temperature source Oral, resp. rate 16, height '5\' 2"'  (1.575 m), weight 87.998 kg (194 lb), last menstrual period 01/10/2015, SpO2 97 %.  Risk to Self: Is patient at risk for suicide?: Yes Risk  to Others:   Prior Inpatient Therapy:   Prior Outpatient Therapy:    No current facility-administered medications for this encounter.   Current Outpatient Prescriptions  Medication Sig Dispense Refill  . carbamazepine (TEGRETOL XR) 100 MG 12 hr tablet Take 3 tablets (300 mg total) by mouth 2 (two) times daily. For mood stabilization 90 tablet 0  . cholecalciferol (VITAMIN D) 400 UNITS TABS tablet Take 1 tablet (400 Units total) by mouth 2 (two) times daily. For low calcium 30 each 0  . ciprofloxacin (CIPRO) 500 MG tablet Take 1 tablet (500 mg total) by mouth 2 (two) times daily. For urinary tract infection 2 tablet 0  . FLUoxetine (PROZAC) 20 MG capsule Take 1 capsule (20 mg total) by mouth daily. For depression 30  capsule 0  . naltrexone (DEPADE) 50 MG tablet Take 0.5 tablets (25 mg total) by mouth daily. For alcoholism 30 tablet 0  . omeprazole (PRILOSEC) 20 MG capsule Take 1 capsule (20 mg total) by mouth 2 (two) times daily. For acid reflux    . QUEtiapine (SEROQUEL) 50 MG tablet Take 3 tablets (150 mg total) by mouth at bedtime. For mood control 90 tablet 0  . valACYclovir (VALTREX) 1000 MG tablet take 1 tablet by mouth once daily 30 tablet 10    Musculoskeletal: Strength & Muscle Tone: within normal limits Gait & Station: normal Patient leans: Right  Psychiatric Specialty Exam: Physical Exam  Constitutional: She appears well-developed and well-nourished.  HENT:  Head: Normocephalic and atraumatic.  Eyes: Conjunctivae are normal. Pupils are equal, round, and reactive to light.  Neck: Normal range of motion.  Cardiovascular: Normal heart sounds.   Respiratory: Effort normal.  GI: Soft.  Musculoskeletal: Normal range of motion.  Neurological: She is alert.  Skin: Skin is warm and dry.  Psychiatric: Her mood appears anxious. Her speech is delayed and slurred. She is withdrawn. Cognition and memory are normal. She expresses impulsivity. She exhibits a depressed mood. She expresses suicidal ideation.    Review of Systems  Constitutional: Negative.   HENT: Negative.   Eyes: Negative.   Respiratory: Negative.   Cardiovascular: Negative.   Gastrointestinal: Negative.   Musculoskeletal: Negative.   Skin: Negative.   Neurological: Negative.   Psychiatric/Behavioral: Positive for depression and suicidal ideas. Negative for hallucinations and substance abuse. The patient is nervous/anxious and has insomnia.     Blood pressure 96/60, pulse 50, temperature 98.2 F (36.8 C), temperature source Oral, resp. rate 16, height '5\' 2"'  (1.575 m), weight 87.998 kg (194 lb), last menstrual period 01/10/2015, SpO2 97 %.Body mass index is 35.47 kg/(m^2).  General Appearance: Fairly Groomed  Engineer, water::   Minimal  Speech:  Slow  Volume:  Decreased  Mood:  Depressed  Affect:  Flat  Thought Process:  Linear  Orientation:  Full (Time, Place, and Person)  Thought Content:  Negative  Suicidal Thoughts:  Yes.  with intent/plan  Homicidal Thoughts:  No  Memory:  Immediate;   Good Recent;   Good Remote;   Good  Judgement:  Impaired  Insight:  Lacking  Psychomotor Activity:  Decreased  Concentration:  Fair  Recall:  [employer] of Knowledge:Fair  Language: Fair  Akathisia:  No  Handed:  Right  AIMS (if indicated):     Assets:  Financial Resources/Insurance Housing Intimacy Physical Health Social Support  ADL's:  Intact  Cognition: WNL  Sleep:      Medical Decision Making: Review of Psycho-Social Stressors (1), Review or order clinical lab tests (  1), Discuss test with performing physician (1), Established Problem, Worsening (2), Review of Last Therapy Session (1), Review of Medication Regimen & Side Effects (2) and Review of New Medication or Change in Dosage (2)  Treatment Plan Summary: Medication management and Plan Patient with recent suicide attempt ongoing multiple symptoms of depression. Uncommunicative dysphoric affect. Patient will be admitted to the psychiatry ward. Continue involuntary commitment. Restart medication as best we can from past information. Follow-up with treatment on the ward. She will need outpatient treatment arranged in the community at discharge.  Plan:  Recommend psychiatric Inpatient admission when medically cleared. Supportive therapy provided about ongoing stressors. Continue involuntary commitment. Start medication. Admit to psychiatry Disposition: Admit as noted above continue IVC  Haja Crego, [facility] 01/13/2015 6:09 PM

## 2015-01-13 NOTE — ED Notes (Signed)
Unable to provide urine sample at this time

## 2015-01-14 ENCOUNTER — Inpatient Hospital Stay
Admission: EM | Admit: 2015-01-14 | Discharge: 2015-01-17 | DRG: 885 | Disposition: A | Discharge status: Home / Self Care | Payer: Medicaid Other | Source: Ambulatory Visit | Attending: Psychiatry | Admitting: Psychiatry

## 2015-01-14 DIAGNOSIS — Z9119 Patient's noncompliance with other medical treatment and regimen: Secondary | ICD-10-CM | POA: Diagnosis present

## 2015-01-14 DIAGNOSIS — M25551 Pain in right hip: Secondary | ICD-10-CM | POA: Diagnosis present

## 2015-01-14 DIAGNOSIS — F419 Anxiety disorder, unspecified: Secondary | ICD-10-CM | POA: Diagnosis present

## 2015-01-14 DIAGNOSIS — Z9114 Patient's other noncompliance with medication regimen: Secondary | ICD-10-CM | POA: Diagnosis present

## 2015-01-14 DIAGNOSIS — B009 Herpesviral infection, unspecified: Secondary | ICD-10-CM | POA: Diagnosis present

## 2015-01-14 DIAGNOSIS — F332 Major depressive disorder, recurrent severe without psychotic features: Principal | ICD-10-CM

## 2015-01-14 DIAGNOSIS — G8929 Other chronic pain: Secondary | ICD-10-CM | POA: Diagnosis present

## 2015-01-14 DIAGNOSIS — F609 Personality disorder, unspecified: Secondary | ICD-10-CM | POA: Diagnosis present

## 2015-01-14 DIAGNOSIS — R000 Tachycardia, unspecified: Secondary | ICD-10-CM | POA: Diagnosis present

## 2015-01-14 DIAGNOSIS — Z833 Family history of diabetes mellitus: Secondary | ICD-10-CM | POA: Diagnosis not present

## 2015-01-14 DIAGNOSIS — F4310 Post-traumatic stress disorder, unspecified: Secondary | ICD-10-CM | POA: Diagnosis present

## 2015-01-14 DIAGNOSIS — G4700 Insomnia, unspecified: Secondary | ICD-10-CM | POA: Diagnosis present

## 2015-01-14 DIAGNOSIS — T50901A Poisoning by unspecified drugs, medicaments and biological substances, accidental (unintentional), initial encounter: Secondary | ICD-10-CM | POA: Diagnosis present

## 2015-01-14 DIAGNOSIS — F17210 Nicotine dependence, cigarettes, uncomplicated: Secondary | ICD-10-CM | POA: Diagnosis present

## 2015-01-14 MED ORDER — ALUM & MAG HYDROXIDE-SIMETH 200-200-20 MG/5ML PO SUSP: 30.0000 mL | ORAL | Status: DC | PRN

## 2015-01-14 MED ORDER — QUETIAPINE FUMARATE 25 MG PO TABS
25.0000 mg | ORAL_TABLET | Freq: Two times a day (BID) | ORAL | Status: DC
  Administered 2015-01-15 – 2015-01-17 (×4): 25 mg via ORAL
  Filled 2015-01-14 (×5): qty 1

## 2015-01-14 MED ORDER — MAGNESIUM HYDROXIDE 400 MG/5ML PO SUSP: 30.0000 mL | Freq: Every day | ORAL | Status: DC | PRN

## 2015-01-14 MED ORDER — ACETAMINOPHEN 325 MG PO TABS: 650.0000 mg | ORAL_TABLET | Freq: Four times a day (QID) | ORAL | Status: DC | PRN

## 2015-01-14 MED ORDER — VALACYCLOVIR HCL 500 MG PO TABS
500.0000 mg | ORAL_TABLET | Freq: Every day | ORAL | Status: DC
  Administered 2015-01-15 – 2015-01-17 (×3): 500 mg via ORAL
  Filled 2015-01-14 (×4): qty 1

## 2015-01-14 MED ORDER — QUETIAPINE FUMARATE 100 MG PO TABS
100.0000 mg | ORAL_TABLET | Freq: Every day | ORAL | Status: DC
  Administered 2015-01-15 – 2015-01-16 (×2): 100 mg via ORAL
  Filled 2015-01-14 (×3): qty 1

## 2015-01-14 NOTE — Tx Team (Signed)
Initial Interdisciplinary Treatment Plan   PATIENT STRESSORS: Traumatic event   PATIENT STRENGTHS: Communication skills   PROBLEM LIST: Problem List/Patient Goals Date to be addressed Date deferred Reason deferred Estimated date of resolution  Major Depression 01/14/15   01/30/15  Suicidal Thoughts 01/14/15   01/30/15                                             DISCHARGE CRITERIA:  Ability to meet basic life and health needs Improved stabilization in mood, thinking, and/or behavior Safe-care adequate arrangements made  PRELIMINARY DISCHARGE PLAN: Return to previous living arrangement  PATIENT/FAMIILY INVOLVEMENT: This treatment plan has been presented to and reviewed with the patient, Jenny Baldwin, and/or family membe.  The patient and family have been given the opportunity to ask questions and make suggestions.  Brice Potteiger A Raynelle Fujikawa 01/14/2015, 3:17 AM

## 2015-01-14 NOTE — H&P (Signed)
Psychiatric Admission Assessment Adult  Patient Identification: Jenny Baldwin MRN:  [id number] Date of Evaluation:  01/14/2015 Chief Complaint:  ptsd Principal Diagnosis: <principal problem not specified> Diagnosis:   Patient Active Problem List   Diagnosis Date Noted  . PTSD (post-traumatic stress disorder) [F43.10] 04/22/2013  . 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 Cesarean delivery, without mention of indication, delivered, with or without mention of antepartum condition [O82] 05/11/2012   History of Present Illness::   Patient is a 29 year old female with history of mood disorder and PTSD who was admitted after she overdosed on a handful of pills. Most of the history was obtained from the patient and review of her chart. Patient was irate during the interview and does not want to provide information about her admission as she was repeatedly saying that why I have not read her chart she reported that she has not taken so many pills and she wants to go home. She also stated that the stupor Dr. brought her to the hospital although she did not do anything wrong. According to the records she has indicated that she was wishing that she could die and was suicidal and has history of depression and was feeling more depressed recently. Patient reported that she does not want to take any medication and they have given her Seroquel in the emergency room and she does not want to continue taking the medication she denied using any drugs or alcohol and reported that she does not want to see any psychiatrist either. Patient continued to minimize her symptoms as well as the reason of admission to the hospital at this time. She stated that when she gets stressed out at home she will just go out and take a deep breath and try to relax. She also got upset when I was trying to get more information about her children  Elements:  Location:  As noted above. Associated Signs/Symptoms: Patient has  history of depression and has been under treatment with her primary care physician for she reported that she only wants to take the Prozac and is noncompliant with her medications Depression Symptoms:  depressed mood, psychomotor agitation, difficulty concentrating, hopelessness, anxiety, disturbed sleep, (Hypo) Manic Symptoms:  Flight of Ideas, Impulsivity, Irritable Mood, Anxiety Symptoms:  None reported Psychotic Symptoms:  Unreported PTSD Symptoms: Negative Total Time spent with patient: 1 hour  Past Medical History:  Past Medical History  Diagnosis Date  . Acid reflux   . Recurrent UTI     with pregnancy  . SVD (spontaneous vaginal delivery)     x 5  . Bipolar 1 disorder   . PTSD (post-traumatic stress disorder)   . Chronic right hip pain   . Prediabetes   . Diabetes mellitus without complication     prediabetes    Past Surgical History  Procedure Laterality Date  . No past surgeries    . Cesarean section  05/08/2012    Procedure: CESAREAN SECTION;  Surgeon: Shelly Bombard, MD;  Location: [facility];  Service: Obstetrics;  Laterality: N/A;  Primary Cesarean Section Delivery Girl @ 2993, Apgars 9/9   Family History:  Family History  Problem Relation Age of Onset  . Anesthesia problems Neg Hx   . Diabetes Mother   . Diabetes Maternal Aunt   . Diabetes Maternal Uncle    Social History:  History  Alcohol Use  . 1.2 oz/week  . 2 Glasses of wine per week     History  Drug Use No  History   Social History  . Marital Status: Married    Spouse Name: N/A  . Number of Children: N/A  . Years of Education: N/A   Social History Main Topics  . Smoking status: Current Every Day Smoker -- 1.00 packs/day for 14 years    Types: Cigarettes    Last Attempt to Quit: 11/08/2011  . Smokeless tobacco: Never Used  . Alcohol Use: 1.2 oz/week    2 Glasses of wine per week  . Drug Use: No  . Sexual Activity: Yes    Birth Control/ Protection: None   Other Topics Concern   . None   Social History Narrative   Additional Social History:    is currently married and lives with her husband since 2005.  Asked about her relationship she replied "we are all right".  He has 6 children but she does not want to share the ages of her children at this time. 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 She has been diagnosed with PTSD in the past. She has never seen a psychiatrist and is being treated by her primary care physician                      Musculoskeletal: Strength & Muscle Tone: within normal limits Gait & Station: normal Patient leans: Right and N/A  Psychiatric Specialty Exam: Physical Exam  Review of Systems  Constitutional: Negative for fever and weight loss.  HENT: Negative for congestion and ear pain.   Eyes: Negative for pain.  Respiratory: Negative for hemoptysis and shortness of breath.   Cardiovascular: Negative for orthopnea.  Gastrointestinal: Negative for vomiting and constipation.  Genitourinary: Negative for hematuria.  Musculoskeletal: Negative for back pain.  Neurological: Negative for dizziness, speech change, weakness and headaches.  Endo/Heme/Allergies: Negative for environmental allergies.  Psychiatric/Behavioral: Positive for depression. Negative for suicidal ideas and hallucinations. The patient does not have insomnia.     Blood pressure 96/62, pulse 43, temperature 98.3 F (36.8 C), temperature source Oral, resp. rate 20, height '5\' 2"'  (1.575 m), weight 87.998 kg (194 lb), last menstrual period 01/12/2015, SpO2 100 %.Body mass index is 35.47 kg/(m^2).  General Appearance: Casual  Eye Contact::  Fair  Speech:  Pressured  Volume:  Increased  Mood:  Angry  Affect:  Full Range  Thought Process:  Circumstantial and Disorganized  Orientation:  Full (Time, Place, and Person)  Thought Content:  NA  Suicidal Thoughts:  No  Homicidal Thoughts:  No  Memory:  good  Judgement:  Impaired  Insight:  Lacking   Psychomotor Activity:  Psychomotor Retardation  Concentration:  Fair  Recall:  [place] of Knowledge:Fair  Language: Fair  Akathisia:  No  Handed:  Right  AIMS (if indicated):     Assets:  Physical Health  ADL's:  Intact  Cognition: WNL  Sleep:  Number of Hours: 3.5   Risk to Self: Is patient at risk for suicide?: No Risk to Others:   Prior Inpatient Therapy:   Prior Outpatient Therapy:    Alcohol Screening: 1. How often do you have a drink containing alcohol?: Never 9. Have you or someone else been injured as a result of your drinking?: No 10. Has a relative or friend or a doctor or another health worker been concerned about your drinking or suggested you cut down?: No Alcohol Use Disorder Identification Test Final Score (AUDIT): 0 Brief Intervention: AUDIT score less than 7 or less-screening does not suggest unhealthy drinking-brief  intervention not indicated  Allergies:   Allergies  Allergen Reactions  . Latex Itching and Rash  . Penicillins Swelling  . Pineapple Itching   Lab Results:  Results for orders placed or performed during the hospital encounter of 01/13/15 (from the past 48 hour(s))  CBC     Status: None   Collection Time: 01/13/15 10:42 AM  Result Value Ref Range   WBC 8.2 3.6 - 11.0 K/uL   RBC 4.43 3.80 - 5.20 MIL/uL   Hemoglobin 14.4 12.0 - 16.0 g/dL   HCT 42.0 35.0 - 47.0 %   MCV 94.7 80.0 - 100.0 fL   MCH 32.4 26.0 - 34.0 pg   MCHC 34.2 32.0 - 36.0 g/dL   RDW 12.6 11.5 - 14.5 %   Platelets 263 150 - 440 K/uL  Comprehensive metabolic panel     Status: Abnormal   Collection Time: 01/13/15 10:42 AM  Result Value Ref Range   Sodium 139 135 - 145 mmol/L   Potassium 3.2 (L) 3.5 - 5.1 mmol/L   Chloride 111 101 - 111 mmol/L   CO2 22 22 - 32 mmol/L   Glucose, Bld 118 (H) 65 - 99 mg/dL   BUN 15 6 - 20 mg/dL   Creatinine, Ser 0.92 0.44 - 1.00 mg/dL   Calcium 9.0 8.9 - 10.3 mg/dL   Total Protein 7.7 6.5 - 8.1 g/dL   Albumin 4.1 3.5 - 5.0 g/dL   AST  26 15 - 41 U/L   ALT 25 14 - 54 U/L   Alkaline Phosphatase 46 38 - 126 U/L   Total Bilirubin 0.4 0.3 - 1.2 mg/dL   GFR calc non Af Amer >60 >60 mL/min   GFR calc Af Amer >60 >60 mL/min    Comment: (NOTE) The eGFR has been calculated using the CKD EPI equation. This calculation has not been validated in all clinical situations. eGFR's persistently <60 mL/min signify possible Chronic Kidney Disease.    Anion gap 6 5 - 15  Ethanol (ETOH)     Status: None   Collection Time: 01/13/15 10:42 AM  Result Value Ref Range   Alcohol, Ethyl (B) <5 <5 mg/dL    Comment:        LOWEST DETECTABLE LIMIT FOR SERUM ALCOHOL IS 11 mg/dL FOR MEDICAL PURPOSES ONLY   Acetaminophen level     Status: None   Collection Time: 01/13/15 10:42 AM  Result Value Ref Range   Acetaminophen (Tylenol), Serum 25 10 - 30 ug/mL    Comment:        THERAPEUTIC CONCENTRATIONS VARY SIGNIFICANTLY. A RANGE OF 10-30 ug/mL MAY BE AN EFFECTIVE CONCENTRATION FOR MANY PATIENTS. HOWEVER, SOME ARE BEST TREATED AT CONCENTRATIONS OUTSIDE THIS RANGE. ACETAMINOPHEN CONCENTRATIONS >150 ug/mL AT 4 HOURS AFTER INGESTION AND >50 ug/mL AT 12 HOURS AFTER INGESTION ARE OFTEN ASSOCIATED WITH TOXIC REACTIONS.   Salicylate level     Status: None   Collection Time: 01/13/15 10:42 AM  Result Value Ref Range   Salicylate Lvl <5.3 2.8 - 30.0 mg/dL  Urine Drug Screen, Qualitative [facility])     Status: None   Collection Time: 01/13/15 10:43 AM  Result Value Ref Range   Tricyclic, Ur Screen NONE DETECTED NONE DETECTED   Amphetamines, Ur Screen NONE DETECTED NONE DETECTED   MDMA (Ecstasy)Ur Screen NONE DETECTED NONE DETECTED   Cocaine Metabolite,Ur [place] NONE DETECTED NONE DETECTED   Opiate, Ur Screen NONE DETECTED NONE DETECTED   Phencyclidine (PCP) Ur S NONE DETECTED NONE DETECTED  Cannabinoid 50 Ng, Ur [place] NONE DETECTED NONE DETECTED   Barbiturates, Ur Screen NONE DETECTED NONE DETECTED   Benzodiazepine, Ur Scrn NONE DETECTED NONE  DETECTED   Methadone Scn, Ur NONE DETECTED NONE DETECTED    Comment: (NOTE) 342  Tricyclics, urine               Cutoff 1000 ng/mL 200  Amphetamines, urine             Cutoff 1000 ng/mL 300  MDMA (Ecstasy), urine           Cutoff 500 ng/mL 400  Cocaine Metabolite, urine       Cutoff 300 ng/mL 500  Opiate, urine                   Cutoff 300 ng/mL 600  Phencyclidine (PCP), urine      Cutoff 25 ng/mL 700  Cannabinoid, urine              Cutoff 50 ng/mL 800  Barbiturates, urine             Cutoff 200 ng/mL 900  Benzodiazepine, urine           Cutoff 200 ng/mL 1000 Methadone, urine                Cutoff 300 ng/mL 1100 1200 The urine drug screen provides only a preliminary, unconfirmed 1300 analytical test result and should not be used for non-medical 1400 purposes. Clinical consideration and professional judgment should 1500 be applied to any positive drug screen result due to possible 1600 interfering substances. A more specific alternate chemical method 1700 must be used in order to obtain a confirmed analytical result.  1800 Gas chromato graphy / mass spectrometry (GC/MS) is the preferred 1900 confirmatory method.   Glucose, capillary     Status: None   Collection Time: 01/13/15 11:36 AM  Result Value Ref Range   Glucose-Capillary 92 65 - 99 mg/dL  Acetaminophen level     Status: Abnormal   Collection Time: 01/13/15  2:10 PM  Result Value Ref Range   Acetaminophen (Tylenol), Serum 49 (H) 10 - 30 ug/mL    Comment:        THERAPEUTIC CONCENTRATIONS VARY SIGNIFICANTLY. A RANGE OF 10-30 ug/mL MAY BE AN EFFECTIVE CONCENTRATION FOR MANY PATIENTS. HOWEVER, SOME ARE BEST TREATED AT CONCENTRATIONS OUTSIDE THIS RANGE. ACETAMINOPHEN CONCENTRATIONS >150 ug/mL AT 4 HOURS AFTER INGESTION AND >50 ug/mL AT 12 HOURS AFTER INGESTION ARE OFTEN ASSOCIATED WITH TOXIC REACTIONS.   Acetaminophen level     Status: None   Collection Time: 01/13/15  7:26 PM  Result Value Ref Range    Acetaminophen (Tylenol), Serum 12 10 - 30 ug/mL    Comment:        THERAPEUTIC CONCENTRATIONS VARY SIGNIFICANTLY. A RANGE OF 10-30 ug/mL MAY BE AN EFFECTIVE CONCENTRATION FOR MANY PATIENTS. HOWEVER, SOME ARE BEST TREATED AT CONCENTRATIONS OUTSIDE THIS RANGE. ACETAMINOPHEN CONCENTRATIONS >150 ug/mL AT 4 HOURS AFTER INGESTION AND >50 ug/mL AT 12 HOURS AFTER INGESTION ARE OFTEN ASSOCIATED WITH TOXIC REACTIONS.    Current Medications: Current Facility-Administered Medications  Medication Dose Route Frequency Provider Last Rate Last Dose  . acetaminophen (TYLENOL) tablet 650 mg  650 mg Oral Q6H PRN Gonzella Lex, MD      . alum & mag hydroxide-simeth (MAALOX/MYLANTA) 200-200-20 MG/5ML suspension 30 mL  30 mL Oral Q4H PRN Gonzella Lex, MD      . magnesium hydroxide (MILK OF MAGNESIA) suspension 30  mL  30 mL Oral Daily PRN Gonzella Lex, MD      . QUEtiapine (SEROQUEL) tablet 100 mg  100 mg Oral QHS Rainey Pines, MD      . QUEtiapine (SEROQUEL) tablet 25 mg  25 mg Oral BID Rainey Pines, MD   25 mg at 01/14/15 1250  . valACYclovir (VALTREX) tablet 500 mg  500 mg Oral Daily Rainey Pines, MD   500 mg at 01/14/15 1251   PTA Medications: Prescriptions prior to admission  Medication Sig Dispense Refill Last Dose  . ARIPiprazole (ABILIFY) 15 MG tablet Take 15 mg by mouth daily.   Past Month at Unknown time  . carbamazepine (TEGRETOL XR) 100 MG 12 hr tablet Take 3 tablets (300 mg total) by mouth 2 (two) times daily. For mood stabilization (Patient not taking: Reported on 01/13/2015) 90 tablet 0 Not Taking at Unknown time  . cholecalciferol (VITAMIN D) 400 UNITS TABS tablet Take 1 tablet (400 Units total) by mouth 2 (two) times daily. For low calcium 30 each 0 Past Month at Unknown time  . ciprofloxacin (CIPRO) 500 MG tablet Take 1 tablet (500 mg total) by mouth 2 (two) times daily. For urinary tract infection (Patient not taking: Reported on 01/13/2015) 2 tablet 0   . divalproex (DEPAKOTE ER) 500  MG 24 hr tablet Take 1,000 mg by mouth daily.   Past Month at Unknown time  . FLUoxetine (PROZAC) 20 MG capsule Take 1 capsule (20 mg total) by mouth daily. For depression 30 capsule 0 Past Month at Unknown time  . naltrexone (DEPADE) 50 MG tablet Take 0.5 tablets (25 mg total) by mouth daily. For alcoholism (Patient not taking: Reported on 01/13/2015) 30 tablet 0   . omeprazole (PRILOSEC) 20 MG capsule Take 1 capsule (20 mg total) by mouth 2 (two) times daily. For acid reflux   Past Week at Unknown time  . QUEtiapine (SEROQUEL) 50 MG tablet Take 3 tablets (150 mg total) by mouth at bedtime. For mood control (Patient not taking: Reported on 01/13/2015) 90 tablet 0 Not Taking at Unknown time  . trazodone (DESYREL) 300 MG tablet Take 300 mg by mouth at bedtime.   Past Week at Unknown time  . valACYclovir (VALTREX) 1000 MG tablet take 1 tablet by mouth once daily 30 tablet 10 Past Month at Unknown time    Previous Psychotropic Medications: Yes   Substance Abuse History in the last 12 months:  No.    Consequences of Substance Abuse: NA  Results for orders placed or performed during the hospital encounter of 01/13/15 (from the past 72 hour(s))  CBC     Status: None   Collection Time: 01/13/15 10:42 AM  Result Value Ref Range   WBC 8.2 3.6 - 11.0 K/uL   RBC 4.43 3.80 - 5.20 MIL/uL   Hemoglobin 14.4 12.0 - 16.0 g/dL   HCT 42.0 35.0 - 47.0 %   MCV 94.7 80.0 - 100.0 fL   MCH 32.4 26.0 - 34.0 pg   MCHC 34.2 32.0 - 36.0 g/dL   RDW 12.6 11.5 - 14.5 %   Platelets 263 150 - 440 K/uL  Comprehensive metabolic panel     Status: Abnormal   Collection Time: 01/13/15 10:42 AM  Result Value Ref Range   Sodium 139 135 - 145 mmol/L   Potassium 3.2 (L) 3.5 - 5.1 mmol/L   Chloride 111 101 - 111 mmol/L   CO2 22 22 - 32 mmol/L   Glucose, Bld 118 (H) 65 -  99 mg/dL   BUN 15 6 - 20 mg/dL   Creatinine, Ser 0.92 0.44 - 1.00 mg/dL   Calcium 9.0 8.9 - 10.3 mg/dL   Total Protein 7.7 6.5 - 8.1 g/dL   Albumin  4.1 3.5 - 5.0 g/dL   AST 26 15 - 41 U/L   ALT 25 14 - 54 U/L   Alkaline Phosphatase 46 38 - 126 U/L   Total Bilirubin 0.4 0.3 - 1.2 mg/dL   GFR calc non Af Amer >60 >60 mL/min   GFR calc Af Amer >60 >60 mL/min    Comment: (NOTE) The eGFR has been calculated using the CKD EPI equation. This calculation has not been validated in all clinical situations. eGFR's persistently <60 mL/min signify possible Chronic Kidney Disease.    Anion gap 6 5 - 15  Ethanol (ETOH)     Status: None   Collection Time: 01/13/15 10:42 AM  Result Value Ref Range   Alcohol, Ethyl (B) <5 <5 mg/dL    Comment:        LOWEST DETECTABLE LIMIT FOR SERUM ALCOHOL IS 11 mg/dL FOR MEDICAL PURPOSES ONLY   Acetaminophen level     Status: None   Collection Time: 01/13/15 10:42 AM  Result Value Ref Range   Acetaminophen (Tylenol), Serum 25 10 - 30 ug/mL    Comment:        THERAPEUTIC CONCENTRATIONS VARY SIGNIFICANTLY. A RANGE OF 10-30 ug/mL MAY BE AN EFFECTIVE CONCENTRATION FOR MANY PATIENTS. HOWEVER, SOME ARE BEST TREATED AT CONCENTRATIONS OUTSIDE THIS RANGE. ACETAMINOPHEN CONCENTRATIONS >150 ug/mL AT 4 HOURS AFTER INGESTION AND >50 ug/mL AT 12 HOURS AFTER INGESTION ARE OFTEN ASSOCIATED WITH TOXIC REACTIONS.   Salicylate level     Status: None   Collection Time: 01/13/15 10:42 AM  Result Value Ref Range   Salicylate Lvl <1.9 2.8 - 30.0 mg/dL  Urine Drug Screen, Qualitative [facility])     Status: None   Collection Time: 01/13/15 10:43 AM  Result Value Ref Range   Tricyclic, Ur Screen NONE DETECTED NONE DETECTED   Amphetamines, Ur Screen NONE DETECTED NONE DETECTED   MDMA (Ecstasy)Ur Screen NONE DETECTED NONE DETECTED   Cocaine Metabolite,Ur [place] NONE DETECTED NONE DETECTED   Opiate, Ur Screen NONE DETECTED NONE DETECTED   Phencyclidine (PCP) Ur S NONE DETECTED NONE DETECTED   Cannabinoid 50 Ng, Ur [place] NONE DETECTED NONE DETECTED   Barbiturates, Ur Screen NONE DETECTED NONE DETECTED   Benzodiazepine, Ur Scrn  NONE DETECTED NONE DETECTED   Methadone Scn, Ur NONE DETECTED NONE DETECTED    Comment: (NOTE) 379  Tricyclics, urine               Cutoff 1000 ng/mL 200  Amphetamines, urine             Cutoff 1000 ng/mL 300  MDMA (Ecstasy), urine           Cutoff 500 ng/mL 400  Cocaine Metabolite, urine       Cutoff 300 ng/mL 500  Opiate, urine                   Cutoff 300 ng/mL 600  Phencyclidine (PCP), urine      Cutoff 25 ng/mL 700  Cannabinoid, urine              Cutoff 50 ng/mL 800  Barbiturates, urine             Cutoff 200 ng/mL 900  Benzodiazepine, urine  Cutoff 200 ng/mL 1000 Methadone, urine                Cutoff 300 ng/mL 1100 1200 The urine drug screen provides only a preliminary, unconfirmed 1300 analytical test result and should not be used for non-medical 1400 purposes. Clinical consideration and professional judgment should 1500 be applied to any positive drug screen result due to possible 1600 interfering substances. A more specific alternate chemical method 1700 must be used in order to obtain a confirmed analytical result.  1800 Gas chromato graphy / mass spectrometry (GC/MS) is the preferred 1900 confirmatory method.   Glucose, capillary     Status: None   Collection Time: 01/13/15 11:36 AM  Result Value Ref Range   Glucose-Capillary 92 65 - 99 mg/dL  Acetaminophen level     Status: Abnormal   Collection Time: 01/13/15  2:10 PM  Result Value Ref Range   Acetaminophen (Tylenol), Serum 49 (H) 10 - 30 ug/mL    Comment:        THERAPEUTIC CONCENTRATIONS VARY SIGNIFICANTLY. A RANGE OF 10-30 ug/mL MAY BE AN EFFECTIVE CONCENTRATION FOR MANY PATIENTS. HOWEVER, SOME ARE BEST TREATED AT CONCENTRATIONS OUTSIDE THIS RANGE. ACETAMINOPHEN CONCENTRATIONS >150 ug/mL AT 4 HOURS AFTER INGESTION AND >50 ug/mL AT 12 HOURS AFTER INGESTION ARE OFTEN ASSOCIATED WITH TOXIC REACTIONS.   Acetaminophen level     Status: None   Collection Time: 01/13/15  7:26 PM  Result Value Ref  Range   Acetaminophen (Tylenol), Serum 12 10 - 30 ug/mL    Comment:        THERAPEUTIC CONCENTRATIONS VARY SIGNIFICANTLY. A RANGE OF 10-30 ug/mL MAY BE AN EFFECTIVE CONCENTRATION FOR MANY PATIENTS. HOWEVER, SOME ARE BEST TREATED AT CONCENTRATIONS OUTSIDE THIS RANGE. ACETAMINOPHEN CONCENTRATIONS >150 ug/mL AT 4 HOURS AFTER INGESTION AND >50 ug/mL AT 12 HOURS AFTER INGESTION ARE OFTEN ASSOCIATED WITH TOXIC REACTIONS.     Observation Level/Precautions:  Continuous Observation  Laboratory:  CBC  Psychotherapy:    Medications:    Consultations:    Discharge Concerns:    Estimated LOS:  Other:     Psychological Evaluations: No   Treatment Plan Summary: Daily contact with patient to assess and evaluate symptoms and progress in treatment  Medical Decision Making:  Review of Psycho-Social Stressors (1)   Pt will be admitted to inpatient [facility] for stabilization and safety. Continue with IVC She will be started on Seroquel to help with the mood stabilization and discussed  with patient about the side effects in detail I will continue with the medications and will adjust them to target the symptoms.  Closely monitor the adverse effects, efficacy and therapeutic response of medication. Pt will attend the group and milieu therapy.  Pt will be evaluated by the treatment team on a regular basis to discuss treatment plan and discharge planning.   SW and other staff to help with disposition.   Thank you for allowing me to participate in care of this pt.   I certify that inpatient services furnished can reasonably be expected to improve the patient's condition.       This note was generated in part or whole with voice recognition software. Voice regonition is usually quite accurate but there are transcription errors that can and very often do occur. I apologize for any typographical errors that were not detected and corrected.     Rainey Pines 5/28/20165:27  PM

## 2015-01-14 NOTE — ED Notes (Signed)
BEHAVIORAL HEALTH ROUNDING Patient sleeping: Yes.   Patient alert and oriented: not applicable Behavior appropriate: Yes.  ; If no, describe:  Nutrition and fluids offered: No Toileting and hygiene offered: No Sitter present: yes Law enforcement present: Yes   

## 2015-01-14 NOTE — Plan of Care (Signed)
Problem: Ineffective individual coping Goal: LTG: Patient will report a decrease in negative feelings Outcome: Not Progressing Patient reports that depression is still a 7/10 on the depression scale, she remains sad and hopeless.

## 2015-01-14 NOTE — ED Notes (Signed)
Attempted to call report to behavioral med.  Asked to call back in 20 min.

## 2015-01-14 NOTE — Progress Notes (Signed)
Patient has been alert and oriented today.  Affect has been flat.  Her mood is melancholic and irritable .  Patient has intense eye contact. She refused medication and has not been  group compliant.  Denies suicidal or homicidal thoughts.  Denies auditory or visual hallucinations.  Q 15 min checks in place.  Will cont to monitor for safety.

## 2015-01-14 NOTE — Progress Notes (Signed)
Patient admitted from the emergency department under IVC for depression and suicidal thoughts after attempted OD. She is Ox4, she reports that her depression is a 7/10 on the depression scale. She denies suicidal ideation at this time. She tells Clinical research associatewriter that she believes she is ready to go back home. Her main stressor she reports is that her mother passed unexpectedly on her son's 414 th birthday last year. She is cooperative thus far in bed resting at this time.

## 2015-01-14 NOTE — [facility] Counselor (Signed)
Authorization Request for Psych Inpt. Treatment from [employer] completed and submitted. [id number]

## 2015-01-15 NOTE — Progress Notes (Signed)
Patient continues to be irritable and refuses to take any scheduled PO medications; denies feeling depressed, but admits to feeling severe anxiety; denies experiencing any hallucinations; has been seclusive to her room throughout the shift; continue to monitor.

## 2015-01-15 NOTE — Progress Notes (Signed)
Pt has been pleasant and cooperative. Pts mood and affect has been depressed. Pt has been seclusive to her room most of the day. Pt denies SI and A/V hallucinations. Pt has taken all po meds this period without coaxing. No inappropriate behaviors noted this period.

## 2015-01-15 NOTE — Plan of Care (Signed)
Problem: Ineffective individual coping Goal: LTG: Patient will report a decrease in negative feelings Outcome: Not Progressing Patient denies feeling depressed or suicidal, but continues to experience moderate anxiety.

## 2015-01-15 NOTE — Progress Notes (Signed)
[facility]  01/15/2015 4:23 PM Jenny Baldwin  MRN:  [id number] Subjective:    Patient is a 29 year old female who was admitted after she overdosed on her medications. She was seen for follow-up. Patient remains somewhat agitated during the interview. She stated that she does not want to stay here and wants to be discharged. Staff reported that she has started taking her medications and to the Seroquel this morning. Patient reported that she did not sleep well last night and was having mood swings and anger as she is currently in the hospital. She continues to have poor insight about the reason for her hospitalization and was minimizing the events. She is not open to discussion and remains very guarded  Principal Problem: <principal problem not specified> Diagnosis:   Patient Active Problem List   Diagnosis Date Noted  . Major depressive disorder, recurrent, severe without psychotic features [F33.2]   . PTSD (post-traumatic stress disorder) [F43.10] 04/22/2013  . 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 Cesarean delivery, without mention of indication, delivered, with or without mention of antepartum condition [O82] 05/11/2012   Total Time spent with patient: 20 minutes     Past Medical History:  Past Medical History  Diagnosis Date  . Acid reflux   . Recurrent UTI     with pregnancy  . SVD (spontaneous vaginal delivery)     x 5  . Bipolar 1 disorder   . PTSD (post-traumatic stress disorder)   . Chronic right hip pain   . Prediabetes   . Diabetes mellitus without complication     prediabetes    Past Surgical History  Procedure Laterality Date  . No past surgeries    . Cesarean section  05/08/2012    Procedure: CESAREAN SECTION;  Surgeon: Brock Badharles A [name], MD;  Location: [facility];  Service: Obstetrics;  Laterality: N/A;  Primary Cesarean Section Delivery Girl @ 1555, Apgars 9/9   Family History:  Family History  Problem Relation Age of Onset  . Anesthesia  problems Neg Hx   . Diabetes Mother   . Diabetes Maternal Aunt   . Diabetes Maternal Uncle    Social History:  History  Alcohol Use  . 1.2 oz/week  . 2 Glasses of wine per week     History  Drug Use No    History   Social History  . Marital Status: Married    Spouse Name: N/A  . Number of Children: N/A  . Years of Education: N/A   Social History Main Topics  . Smoking status: Current Every Day Smoker -- 1.00 packs/day for 14 years    Types: Cigarettes    Last Attempt to Quit: 11/08/2011  . Smokeless tobacco: Never Used  . Alcohol Use: 1.2 oz/week    2 Glasses of wine per week  . Drug Use: No  . Sexual Activity: Yes    Birth Control/ Protection: None   Other Topics Concern  . None   Social History Narrative   Additional History:    Sleep: Poor  Appetite:  Fair   Assessment:   Musculoskeletal: Strength & Muscle Tone: within normal limits Gait & Station: normal Patient leans: N/A   Psychiatric Specialty Exam: Physical Exam  Review of Systems  Constitutional: Negative for fever.  HENT: Negative for ear pain.   Eyes: Negative for photophobia.  Respiratory: Negative for hemoptysis.   Cardiovascular: Negative for palpitations.  Genitourinary: Negative for dysuria.  Musculoskeletal: Negative for neck pain.  Skin: Negative for  rash.  Neurological: Negative for focal weakness and weakness.  Psychiatric/Behavioral: Positive for substance abuse. Negative for depression and hallucinations. The patient is nervous/anxious.     Blood pressure 116/75, pulse 54, temperature 98.4 F (36.9 C), temperature source Oral, resp. rate 20, height  (1.575 m), weight 87.998 kg (194 lb), last menstrual period 01/12/2015, SpO2 100 %.Body mass index is 35.47 kg/(m^2).  General Appearance: Casual  Eye Contact::  Fair  Speech:  Normal Rate  Volume:  Normal  Mood:  Anxious  Affect:  Constricted  Thought Process:  Logical  Orientation:  Full (Time, Place, and Person)   Thought Content:  NA  Suicidal Thoughts:  minimizing her attempt   Homicidal Thoughts:  No  Memory:  Immediate;   Good  Judgement:  Impaired  Insight:  Lacking  Psychomotor Activity:  Normal  Concentration:  Fair  Recall:  [employer] of Knowledge:Fair  Language: Fair  Akathisia:  No  Handed:  Right  AIMS (if indicated):     Assets:  Communication Skills  ADL's:  Intact  Cognition: WNL  Sleep:  Number of Hours: 6.15     Current Medications: Current Facility-Administered Medications  Medication Dose Route Frequency Provider Last Rate Last Dose  . acetaminophen (TYLENOL) tablet 650 mg  650 mg Oral Q6H PRN Audery Amel, MD      . alum & mag hydroxide-simeth (MAALOX/MYLANTA) 200-200-20 MG/5ML suspension 30 mL  30 mL Oral Q4H PRN Audery Amel, MD      . magnesium hydroxide (MILK OF MAGNESIA) suspension 30 mL  30 mL Oral Daily PRN Audery Amel, MD      . QUEtiapine (SEROQUEL) tablet 100 mg  100 mg Oral QHS Brandy Hale, MD   100 mg at 01/14/15 2129  . QUEtiapine (SEROQUEL) tablet 25 mg  25 mg Oral BID Brandy Hale, MD   25 mg at 01/15/15 1242  . valACYclovir (VALTREX) tablet 500 mg  500 mg Oral Daily Brandy Hale, MD   500 mg at 01/15/15 0900    Lab Results:  Results for orders placed or performed during the hospital encounter of 01/13/15 (from the past 48 hour(s))  Acetaminophen level     Status: None   Collection Time: 01/13/15  7:26 PM  Result Value Ref Range   Acetaminophen (Tylenol), Serum 12 10 - 30 ug/mL    Comment:        THERAPEUTIC CONCENTRATIONS VARY SIGNIFICANTLY. A RANGE OF 10-30 ug/mL MAY BE AN EFFECTIVE CONCENTRATION FOR MANY PATIENTS. HOWEVER, SOME ARE BEST TREATED AT CONCENTRATIONS OUTSIDE THIS RANGE. ACETAMINOPHEN CONCENTRATIONS >150 ug/mL AT 4 HOURS AFTER INGESTION AND >50 ug/mL AT 12 HOURS AFTER INGESTION ARE OFTEN ASSOCIATED WITH TOXIC REACTIONS.     Physical Findings: AIMS: Facial and Oral Movements Muscles of Facial Expression: None,  normal Lips and Perioral Area: None, normal Jaw: None, normal Tongue: None, normal,Extremity Movements Upper (arms, wrists, hands, fingers): None, normal Lower (legs, knees, ankles, toes): None, normal, Trunk Movements Neck, shoulders, hips: None, normal, Overall Severity Severity of abnormal movements (highest score from questions above): None, normal Incapacitation due to abnormal movements: None, normal Patient's awareness of abnormal movements (rate only patient's report): No Awareness, Dental Status Current problems with teeth and/or dentures?: No Does patient usually wear dentures?: No  CIWA:  CIWA-Ar Total: 0 COWS:     Treatment Plan Summary: Daily contact with patient to assess and evaluate symptoms and progress in treatment and Medication management   Medical Decision Making:  Established  Problem, Stable/Improving (1)  Discussed with patient about the medications and she will continue on Seroquel  for mood stabilization Continue with IVC  I will adjust  them to target the symptoms.  Closely monitor the adverse effects, efficacy and therapeutic response of medication. Pt will attend the group and milieu therapy.  Pt will be evaluated by the treatment team on a regular basis to discuss treatment plan and discharge planning.  SW and other staff to help with disposition.   Thank you for allowing me to participate in care of this pt.     This note was generated in part or whole with voice recognition software. Voice regonition is usually quite accurate but there are transcription errors that can and very often do occur. I apologize for any typographical errors that were not detected and corrected.    Brandy Hale 01/15/2015, 4:23 PM

## 2015-01-15 NOTE — [facility] Group Notes (Signed)
[facility] Group Notes:  (Nursing/MHT/Case Management/Adjunct)  Date:  01/14/2015  Time:  2030  Type of Therapy:  Group Therapy  Participation Level:  Minimal  Participation Quality:  Resistant  Affect:  Irritable  Cognitive:  Oriented  Insight:  Improving  Engagement in Group:  Improving  Modes of Intervention:  Discussion  Summary of Progress/Problems:  Jenny Baldwin L Jenny Baldwin 01/15/2015, 4:46 AM

## 2015-01-16 NOTE — Progress Notes (Signed)
Johnny BridgeMartha very pleasant and cooperative.Smiles good eye contact.Denies SI.Depression not as bad.Med compliant.States she just wants to go home and see her kids.No interaction with staff.Will continue to monitor closely.

## 2015-01-16 NOTE — Plan of Care (Signed)
Problem: Ineffective individual coping Goal: STG: Patient will remain free from self harm Outcome: Progressing Patient denies any SI at present, and contracts for safety.     

## 2015-01-16 NOTE — Progress Notes (Signed)
Patient denies feeling depressed or anxious; denies any SI and contracts for safety; denies any hallucinations; continues to be mildly irritable, states "I'm irritable because I'm here and I don't belong." Patient has been quiet and cooperative throughout the shift; took all PO medications as ordered; no further voiced complaints; continue to monitor.

## 2015-01-16 NOTE — Progress Notes (Signed)
Patient has been alert and oriented today. Affect has been flat. Her mood is depressed and she has stayed in bed most of the day. . She refused medication and has not been group compliant. Denies suicidal or homicidal thoughts. Denies auditory or visual hallucinations. Q 15 min checks in place. Will cont to monitor for safety.

## 2015-01-16 NOTE — [facility] Group Notes (Signed)
[facility] Group Notes:  (Nursing/MHT/Case Management/Adjunct)  Date:  01/16/2015  Time:  1:48 PM  Type of Therapy:  Psychoeducational Skills  Participation Level:  Did Not Attend  Participation Quality:  n/a  Affect:  n/a  Cognitive:  n/a  Insight:  None  Engagement in Group:  n/a  Modes of Intervention:  n/a  Summary of Progress/Problems:  Lynelle SmokeCara Travis Myquan Schaumburg 01/16/2015, 1:48 PM

## 2015-01-16 NOTE — Progress Notes (Signed)
Recreation Therapy Notes  INPATIENT RECREATION THERAPY ASSESSMENT  Patient Details Name: Jenny Baldwin MRN: [id number] DOB: 10-24-1985 Today's Date: 01/16/2015  Patient Stressors: Death  Coping Skills:   Avoidance, Other (Comment) (Go outside, Breathe in and out)  Personal Challenges:  Patient reported no personal challenges  Leisure Interests (2+):   (Nothing)  Awareness of Community Resources:  No  Community Resources:     Current Use:    If no, Barriers?:    Patient Strengths:  "I really don't want to go into that"  Patient Identified Areas of Improvement:  No  Current Recreation Participation:  Camping  Patient Goal for Hospitalization:  To leave here  [place]  County of Residence:  [place]   Current SI (including self-harm):  No  Current HI:  No  Consent to Intern Participation: N/A  Due to patient reporting no personal challenges, LRT will not develop a care plan. LRT will continue to check on patient. If patient's status changes, LRT will develop a care plan.  Jacquelynn CreeGreene,Tammara Massing M, LRT/CTRS 01/16/2015, 12:36 PM

## 2015-01-16 NOTE — Progress Notes (Signed)
[facility]  01/16/2015 7:30 AM Jenny Baldwin  MRN:  [id number] Subjective:    Patient is a 29 year old female who was admitted after she overdosed on her medications. She was seen for follow-up. Patient appears somewhat calm during the interview. She reported that she started taking the Seroquel and it is making her tired. However her mood seems to be more calm and she was able to answer questions more appropriately. She currently denied having any mood swings, anger or anxiety or paranoia. She reported that she wants to be discharged so she can take care of her children. She is able to contract for safety at this time and denied having any behavioral issues. The staff also reported that she is thought having any suicidal ideations or plans.   Principal Problem: <principal problem not specified> Diagnosis:   Patient Active Problem List   Diagnosis Date Noted  . Major depressive disorder, recurrent, severe without psychotic features [F33.2]   . PTSD (post-traumatic stress disorder) [F43.10] 04/22/2013  . 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 Cesarean delivery, without mention of indication, delivered, with or without mention of antepartum condition [O82] 05/11/2012   Total Time spent with patient: 20 minutes     Past Medical History:  Past Medical History  Diagnosis Date  . Acid reflux   . Recurrent UTI     with pregnancy  . SVD (spontaneous vaginal delivery)     x 5  . Bipolar 1 disorder   . PTSD (post-traumatic stress disorder)   . Chronic right hip pain   . Prediabetes   . Diabetes mellitus without complication     prediabetes    Past Surgical History  Procedure Laterality Date  . No past surgeries    . Cesarean section  05/08/2012    Procedure: CESAREAN SECTION;  Surgeon: Brock Badharles A [name], MD;  Location: [facility];  Service: Obstetrics;  Laterality: N/A;  Primary Cesarean Section Delivery Girl @ 1555, Apgars 9/9   Family History:  Family History   Problem Relation Age of Onset  . Anesthesia problems Neg Hx   . Diabetes Mother   . Diabetes Maternal Aunt   . Diabetes Maternal Uncle    Social History:  History  Alcohol Use  . 1.2 oz/week  . 2 Glasses of wine per week     History  Drug Use No    History   Social History  . Marital Status: Married    Spouse Name: N/A  . Number of Children: N/A  . Years of Education: N/A   Social History Main Topics  . Smoking status: Current Every Day Smoker -- 1.00 packs/day for 14 years    Types: Cigarettes    Last Attempt to Quit: 11/08/2011  . Smokeless tobacco: Never Used  . Alcohol Use: 1.2 oz/week    2 Glasses of wine per week  . Drug Use: No  . Sexual Activity: Yes    Birth Control/ Protection: None   Other Topics Concern  . None   Social History Narrative   Additional History:    Sleep: Fair  Appetite:  Fair   Assessment:   Musculoskeletal: Strength & Muscle Tone: within normal limits Gait & Station: normal Patient leans: N/A   Psychiatric Specialty Exam: Physical Exam   Review of Systems  Constitutional: Negative for weight loss.  HENT: Negative for nosebleeds and tinnitus.   Eyes: Negative for pain.  Respiratory: Positive for sputum production.   Cardiovascular: Negative for orthopnea.  Gastrointestinal:  Negative for abdominal pain.  Genitourinary: Negative for frequency.  Musculoskeletal: Negative for back pain.  Neurological: Negative for tingling and focal weakness.  Endo/Heme/Allergies: Negative for environmental allergies.  Psychiatric/Behavioral: Positive for depression. Negative for hallucinations. The patient does not have insomnia.     Blood pressure 112/73, pulse 49, temperature 98.1 F (36.7 C), temperature source Oral, resp. rate 20, height  (1.575 m), weight 87.998 kg (194 lb), last menstrual period 01/12/2015, SpO2 100 %.Body mass index is 35.47 kg/(m^2).  General Appearance: Casual  Eye Contact::  Fair  Speech:  Normal Rate   Volume:  Normal  Mood:  Anxious  Affect:  Constricted  Thought Process:  Logical  Orientation:  Full (Time, Place, and Person)  Thought Content:  NA  Suicidal Thoughts:  minimizing her attempt   Homicidal Thoughts:  No  Memory:  Immediate;   Good  Judgement:  Impaired  Insight:  Lacking  Psychomotor Activity:  Normal  Concentration:  Fair  Recall:  [employer] of Knowledge:Fair  Language: Fair  Akathisia:  No  Handed:  Right  AIMS (if indicated):     Assets:  Communication Skills  ADL's:  Intact  Cognition: WNL  Sleep:  Number of Hours: 6.3     Current Medications: Current Facility-Administered Medications  Medication Dose Route Frequency Provider Last Rate Last Dose  . acetaminophen (TYLENOL) tablet 650 mg  650 mg Oral Q6H PRN Audery Amel, MD      . alum & mag hydroxide-simeth (MAALOX/MYLANTA) 200-200-20 MG/5ML suspension 30 mL  30 mL Oral Q4H PRN Audery Amel, MD      . magnesium hydroxide (MILK OF MAGNESIA) suspension 30 mL  30 mL Oral Daily PRN Audery Amel, MD      . QUEtiapine (SEROQUEL) tablet 100 mg  100 mg Oral QHS Brandy Hale, MD   100 mg at 01/15/15 2137  . QUEtiapine (SEROQUEL) tablet 25 mg  25 mg Oral BID Brandy Hale, MD   25 mg at 01/15/15 1242  . valACYclovir (VALTREX) tablet 500 mg  500 mg Oral Daily Brandy Hale, MD   500 mg at 01/15/15 0900    Lab Results:  No results found for this or any previous visit (from the past 48 hour(s)).  Physical Findings: AIMS: Facial and Oral Movements Muscles of Facial Expression: None, normal Lips and Perioral Area: None, normal Jaw: None, normal Tongue: None, normal,Extremity Movements Upper (arms, wrists, hands, fingers): None, normal Lower (legs, knees, ankles, toes): None, normal, Trunk Movements Neck, shoulders, hips: None, normal, Overall Severity Severity of abnormal movements (highest score from questions above): None, normal Incapacitation due to abnormal movements: None, normal Patient's awareness  of abnormal movements (rate only patient's report): No Awareness, Dental Status Current problems with teeth and/or dentures?: No Does patient usually wear dentures?: No  CIWA:  CIWA-Ar Total: 0 COWS:     Treatment Plan Summary: Daily contact with patient to assess and evaluate symptoms and progress in treatment and Medication management   Medical Decision Making:  Established Problem, Stable/Improving (1)  Discussed with patient about the medications and she will continue on Seroquel  for mood stabilization Continue with IVC  I will adjust  them to target the symptoms.  Closely monitor the adverse effects, efficacy and therapeutic response of medication. Pt will attend the group and milieu therapy.  Pt will be evaluated by the treatment team on a regular basis to discuss treatment plan and discharge planning.  SW and other staff to help  with disposition.   Thank you for allowing me to participate in care of this pt.     This note was generated in part or whole with voice recognition software. Voice regonition is usually quite accurate but there are transcription errors that can and very often do occur. I apologize for any typographical errors that were not detected and corrected.    Brandy Hale 01/16/2015, 7:30 AM

## 2015-01-16 NOTE — Progress Notes (Signed)
Recreation Therapy Notes  Date: 05.30.16 Time: 3:05 pm Location: Craft Room  Group Topic: Self-expression   Goal Area(s) Addresses:  Patient will draw a bottle. Patient will write at least one emotion they are experiencing.  Behavioral Response: Did not attend  Intervention: Bottled Up  Activity: Patients were instructed to draw a bottled of how they feel and write emotions they are feeling inside the bottle.   Education:LRT educated patients on different forms of self-expression.   Education Outcome: Patient did not attend group.  Clinical Observations/Feedback: Patient did not attend group.  Jacquelynn CreeGreene,Jackquline Branca M, LRT/CTRS 01/16/2015 4:31 PM

## 2015-01-16 NOTE — Plan of Care (Signed)
Problem: Ineffective individual coping Goal: STG: Patient will remain free from self harm Outcome: Progressing Denies SI.Ready to go home.
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
## 2015-01-17 MED ORDER — TRAZODONE HCL 100 MG PO TABS: 100.0000 mg | ORAL_TABLET | Freq: Every day | ORAL | Status: DC

## 2015-01-17 MED ORDER — FLUOXETINE HCL 20 MG PO CAPS: 20.0000 mg | ORAL_CAPSULE | Freq: Every day | ORAL | Status: DC

## 2015-01-17 MED ORDER — IBUPROFEN 600 MG PO TABS: 600.0000 mg | ORAL_TABLET | Freq: Four times a day (QID) | ORAL | Status: DC | PRN

## 2015-01-17 NOTE — [facility] Group Notes (Signed)
[facility] Group Notes:  (Nursing/MHT/Case Management/Adjunct)  Date:  01/17/2015  Time:  2:01 PM  Type of Therapy:  Group Therapy  Participation Level:  Did Not Attend   Marquette Oldmanda Lea Campbell 01/17/2015, 2:01 PM

## 2015-01-17 NOTE — Progress Notes (Signed)
Patient discharged home with husband. Instructions reviewed. Patient verbalized understanding of meds and follow up care. Belongings returned. Denies SI/HI/AVH.

## 2015-01-17 NOTE — [facility] Suicide Risk Assessment (Signed)
[facility] Discharge Suicide Risk Assessment   Demographic Factors:  Caucasian and Low socioeconomic status  Total Time spent with patient: 30 minutes   Psychiatric Specialty Exam: Physical Exam  ROS  Blood pressure 123/84, pulse 84, temperature 98.4 F (36.9 C), temperature source Oral, resp. rate 20, height 5\' 2"  (1.575 m), weight 87.998 kg (194 lb), last menstrual period 01/12/2015, SpO2 100 %.Body mass index is 35.47 kg/(m^2).                                                       Have you used any form of tobacco in the last 30 days? (Cigarettes, Smokeless Tobacco, Cigars, and/or Pipes): Yes  Has this patient used any form of tobacco in the last 30 days? (Cigarettes, Smokeless Tobacco, Cigars, and/or Pipes) No  Mental Status Per Nursing Assessment::   On Admission:     Current Mental Status by Physician: Denies suicidality, homicidality or auditory or visual hallucinations.  Loss Factors: Loss of significant relationship  Historical Factors: History of self injury but family denies prior history of suicidal attempt. Patient denies prior history of suicidal attempt  Risk Reduction Factors:   Responsible for children under 31 years of age, Sense of responsibility to family and Positive social support  Continued Clinical Symptoms:  Depression:   Impulsivity  Cognitive Features That Contribute To Risk:  None    Suicide Risk:  Minimal: No identifiable suicidal ideation.  Patients presenting with no risk factors but with morbid ruminations; may be classified as minimal risk based on the severity of the depressive symptoms  Principal Problem: Major depressive disorder, recurrent, severe without psychotic features Discharge Diagnoses:  Patient Active Problem List   Diagnosis Date Noted  . Major depressive disorder, recurrent, severe without psychotic features [F33.2]   . PTSD (post-traumatic stress disorder) [F43.10] 04/22/2013  . Cesarean delivery,  without mention of indication, delivered, with or without mention of antepartum condition [O82] 05/11/2012      Plan Of Care/Follow-up recommendations:  Other:  Follow-up with outpatient psychiatrist  Is patient on multiple antipsychotic therapies at discharge:  No   Has Patient had three or more failed trials of antipsychotic monotherapy by history:  No  Recommended Plan for Multiple Antipsychotic Therapies: NA    Jimmy FootmanHernandez-Gonzalez,  Meribeth Vitug 01/17/2015, 1:27 PM

## 2015-01-17 NOTE — [facility] Group Notes (Signed)
[facility] LCSW Group Therapy  01/17/2015 4:08 PM  Type of Therapy:  Group Therapy  Participation Level:  Did Not Attend  Participation Quality:  n/a  Affect:  n/a  Cognitive:  n/a  Insight:  n/a  Engagement in Therapy:  n/a  Modes of Intervention:  n/a  Summary of Progress/Problems: Patient did not attend group.  Beryl MeagerIngle, Huey Scalia T 01/17/2015, 4:08 PM

## 2015-01-17 NOTE — Progress Notes (Signed)
[facility] MD Progress Note  01/17/2015 11:48 AM Jenny Baldwin  MRN:  [id number] Subjective:    Patient is a 29 year old female who was admitted after she overdosed on metoprolol (6 tabs of 100 mg, unknown amount of liquid children's benadryl and 6 Midol tablets on 5/27.  Patient was quite irritated this morning, she was demanding to be discharged as she was missing her children. She stated that she no longer needed to be here because she was not feeling depressed or suicidal. The patient was very guarded and evasive during assessment. She did not want to tell me about her prior psychiatric hospitalizations.  She also minimizes her recent suicidal attempt and is states that the only reason why she took the pills is because she was missing her mother who passed away about a year ago.  States her mother passed away unexpectedly of a meal cardio interpretation.. She denies having any financial stressors, marital problems or issues with her children. 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   Per admission notes patient has a history of self injury and mood instability and has been hospitalized twice before at [facility]. Appears that her prior 2 admissions medications included Tegretol, Prozac and naltrexone for alcoholism. Patient consistently has denied any history of substance abuse.  There've been prior presentations to the calm system where her alcohol level was around 180. However during this admission her alcohol level was below detection limit.  Her urine toxicology was negative for all substances.  I attempted to contact her husband at 3 different numbers but I was not able to get a hold of him. Patient states she is not currently seeing a psychiatrist she has been only following up with her primary care provider. She reported that she was noncompliant with any of his psychiatric medications prior to admission.     Principal Problem: Major depressive  disorder, recurrent, severe without psychotic features Diagnosis:   Patient Active Problem List   Diagnosis Date Noted  . Major depressive disorder, recurrent, severe without psychotic features [F33.2]   . PTSD (post-traumatic stress disorder) [F43.10] 04/22/2013  . Cesarean delivery, without mention of indication, delivered, with or without mention of antepartum condition [O82] 05/11/2012   Total Time spent with patient: 20 minutes     Past Medical History:  Past Medical History  Diagnosis Date  . Acid reflux   . Recurrent UTI     with pregnancy  . SVD (spontaneous vaginal delivery)     x 5  . Bipolar 1 disorder   . PTSD (post-traumatic stress disorder)   . Chronic right hip pain   . Prediabetes   . Diabetes mellitus without complication     prediabetes    Past Surgical History  Procedure Laterality Date  . No past surgeries    . Cesarean section  05/08/2012    Procedure: CESAREAN SECTION;  Surgeon: Brock Bad, MD;  Location: [facility];  Service: Obstetrics;  Laterality: N/A;  Primary Cesarean Section Delivery Girl @ 1555, Apgars 9/9   Family History:  Family History  Problem Relation Age of Onset  . Anesthesia problems Neg Hx   . Diabetes Mother   . Diabetes Maternal Aunt   . Diabetes Maternal Uncle    Social History:  History  Alcohol Use  . 1.2 oz/week  . 2 Glasses of wine per week     History  Drug Use No    History  Social History  . Marital Status: Married    Spouse Name: N/A  . Number of Children: N/A  . Years of Education: N/A   Social History Main Topics  . Smoking status: Current Every Day Smoker -- 1.00 packs/day for 14 years    Types: Cigarettes    Last Attempt to Quit: 11/08/2011  . Smokeless tobacco: Never Used  . Alcohol Use: 1.2 oz/week    2 Glasses of wine per week  . Drug Use: No  . Sexual Activity: Yes    Birth Control/ Protection: None   Other Topics Concern  . None   Social History Narrative   Additional History:     Sleep: Fair  Appetite:  Fair   Assessment:   Musculoskeletal: Strength & Muscle Tone: within normal limits Gait & Station: normal Patient leans: N/A   Psychiatric Specialty Exam: Physical Exam   Review of Systems  HENT: Negative.   Eyes: Negative.   Respiratory: Negative.   Cardiovascular: Negative.   Gastrointestinal: Negative.   Genitourinary: Negative.   Musculoskeletal: Negative.   Skin: Negative.   Neurological: Negative.   Endo/Heme/Allergies: Negative.   Psychiatric/Behavioral: Negative.     Blood pressure 123/84, pulse 84, temperature 98.4 F (36.9 C), temperature source Oral, resp. rate 20, height 5\' 2"  (1.575 m), weight 87.998 kg (194 lb), last menstrual period 01/12/2015, SpO2 100 %.Body mass index is 35.47 kg/(m^2).  General Appearance: Casual  Eye Contact::  Fair  Speech:  Normal Rate  Volume:  Normal  Mood:  Irritable  Affect:  Constricted  Thought Process:  Logical  Orientation:  Full (Time, Place, and Person)  Thought Content:  NA  Suicidal Thoughts:  minimizing her attempt   Homicidal Thoughts:  No  Memory:  Immediate;   Good  Judgement:  Impaired  Insight:  Lacking  Psychomotor Activity:  Normal  Concentration:  Fair  Recall:  [employer]  Fund of Knowledge:Fair  Language: Fair  Akathisia:  No  Handed:  Right  AIMS (if indicated):     Assets:  Communication Skills  ADL's:  Intact  Cognition: WNL  Sleep:  Number of Hours: 7     Current Medications: Current Facility-Administered Medications  Medication Dose Route Frequency Provider Last Rate Last Dose  . acetaminophen (TYLENOL) tablet 650 mg  650 mg Oral Q6H PRN Audery AmelJohn T Clapacs, MD      . alum & mag hydroxide-simeth (MAALOX/MYLANTA) 200-200-20 MG/5ML suspension 30 mL  30 mL Oral Q4H PRN Audery AmelJohn T Clapacs, MD      . FLUoxetine (PROZAC) capsule 20 mg  20 mg Oral Daily Jimmy FootmanAndrea Hernandez-Gonzalez, MD      . ibuprofen (ADVIL,MOTRIN) tablet 600 mg  600 mg Oral Q6H PRN Jimmy FootmanAndrea Hernandez-Gonzalez, MD       . magnesium hydroxide (MILK OF MAGNESIA) suspension 30 mL  30 mL Oral Daily PRN Audery AmelJohn T Clapacs, MD      . traZODone (DESYREL) tablet 100 mg  100 mg Oral QHS Jimmy FootmanAndrea Hernandez-Gonzalez, MD      . valACYclovir (VALTREX) tablet 500 mg  500 mg Oral Daily Brandy HaleUzma Faheem, MD   500 mg at 01/17/15 [id number]    Lab Results:  No results found for this or any previous visit (from the past 48 hour(s)).  Physical Findings: AIMS: Facial and Oral Movements Muscles of Facial Expression: None, normal Lips and Perioral Area: None, normal Jaw: None, normal Tongue: None, normal,Extremity Movements Upper (arms, wrists, hands, fingers): None, normal Lower (legs, knees, ankles, toes): None, normal, Trunk Movements  Neck, shoulders, hips: None, normal, Overall Severity Severity of abnormal movements (highest score from questions above): None, normal Incapacitation due to abnormal movements: None, normal Patient's awareness of abnormal movements (rate only patient's report): No Awareness, Dental Status Current problems with teeth and/or dentures?: No Does patient usually wear dentures?: No  CIWA:  CIWA-Ar Total: 0 COWS:     Treatment Plan Summary: Daily contact with patient to assess and evaluate symptoms and progress in treatment and Medication management   Medical Decision Making:  Established Problem, Stable/Improving (1)  Major depressive disorder: Once again patient was very guarded when I attempted to clarify diagnosis as there is mention of possible bipolar disorder and PTSD. 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 For depression I will restart the patient on fluoxetine which was one of her prior to admission medications. Today I will discontinue Seroquel as this medication is abusable and at this point in time I don't see any symptoms of bipolar disorder.  Tachycardia: Patient is states she suffers from tachycardia and for this reason she  was prescribed with the metoprolol 100 mg by mouth daily. Heart rate has been  Bradycardic over the last couple of days, today heart rate is within the normal limits.  Insomnia: I will start the patient on trazodone 100 mg by mouth daily at bedtime when necessary  Herpes: Continue Valtrex  Hospitalization and status continue involuntary commitment  Collateral information: Later on today I will attend again to contact the patient's husband she gave me a number [phone], Bernette Redbird  Discharge disposition: Once a stable this patient will return home. I will make a referral for outpatient care with psychiatry.   Jimmy Footman 01/17/2015, 11:48 AM

## 2015-01-17 NOTE — Discharge Summary (Addendum)
Physician Discharge Summary Note  Patient:  Jenny Baldwin is an 29 y.o., female MRN:  [id number] DOB:  [birth year] Patient phone:  [phone] (home)  Patient address:   [address]  [place] [address],  Total Time spent with patient: 30 minutes  Date of Admission:  01/14/2015 Date of Discharge: 01/17/2015  Reason for Admission:  Overdose on metoprolol  Principal Problem: Major depressive disorder, recurrent, severe without psychotic features Discharge Diagnoses: Patient Active Problem List   Diagnosis Date Noted  . Major depressive disorder, recurrent, severe without psychotic features [F33.2]   . PTSD (post-traumatic stress disorder) [F43.10] 04/22/2013  . Cesarean delivery, without mention of indication, delivered, with or without mention of antepartum condition [O82] 05/11/2012    Musculoskeletal: Strength & Muscle Tone: within normal limits Gait & Station: normal Patient leans: N/A  Psychiatric Specialty Exam: Physical Exam  Review of Systems  Constitutional: Negative.   HENT: Negative.   Eyes: Negative.   Respiratory: Negative.   Cardiovascular: Negative.   Gastrointestinal: Negative.   Genitourinary: Negative.   Musculoskeletal: Negative.   Skin: Negative.   Neurological: Negative.  Negative for loss of consciousness.  Endo/Heme/Allergies: Negative.   Psychiatric/Behavioral: Negative.     Blood pressure 123/84, pulse 84, temperature 98.4 F (36.9 C), temperature source Oral, resp. rate 20, height '5\' 2"'  (1.575 m), weight 87.998 kg (194 lb), last menstrual period 01/12/2015, SpO2 100 %.Body mass index is 35.47 kg/(m^2).  General Appearance: Fairly Groomed  Engineer, water::  Fair  Speech:  Normal Rate  Volume:  Normal  Mood:  Irritable  Affect:  Restricted  Thought Process:  Linear  Orientation:  Full (Time, Place, and Person)  Thought Content:  Hallucinations: None  Suicidal Thoughts:  No  Homicidal Thoughts:  No  Memory:  Immediate;   Good Recent;    Good Remote;   Good  Judgement:  Fair  Insight:  Lacking  Psychomotor Activity:  Normal  Concentration:  Good  Recall:  NA  Fund of Knowledge:Good  Language: Good  Akathisia:  No  Handed:    AIMS (if indicated):     Assets:  Housing Intimacy Social Support  ADL's:  Intact  Cognition: WNL  Sleep:  Number of Hours: 7   Have you used any form of tobacco in the last 30 days? (Cigarettes, Smokeless Tobacco, Cigars, and/or Pipes): Yes  Has this patient used any form of tobacco in the last 30 days? (Cigarettes, Smokeless Tobacco, Cigars, and/or Pipes) No   History of Present Illness::   Patient is a 29 year old female with history of mood disorder and PTSD who was admitted after she overdosed on a handful of pills. Most of the history was obtained from the patient and review of her chart. Patient was irate during the interview and does not want to provide information about her admission as she was repeatedly saying that why I have not read her chart she reported that she has not taken so many pills and she wants to go home. She also stated that the stupor Dr. brought her to the hospital although she did not do anything wrong. According to the records she has indicated that she was wishing that she could die and was suicidal and has history of depression and was feeling more depressed recently. Patient reported that she does not want to take any medication and they have given her Seroquel in the emergency room and she does not want to continue taking the medication she denied using any drugs or alcohol and  reported that she does not want to see any psychiatrist either. Patient continued to minimize her symptoms as well as the reason of admission to the hospital at this time. She stated that when she gets stressed out at home she will just go out and take a deep breath and try to relax. She also got upset when I was trying to get more information about her children  Elements: Location: As noted  above. Associated Signs/Symptoms: Patient has history of depression and has been under treatment with her primary care physician for she reported that she only wants to take the Prozac and is noncompliant with her medications Depression Symptoms: depressed mood, psychomotor agitation, difficulty concentrating, hopelessness, anxiety, disturbed sleep, (Hypo) Manic Symptoms: Flight of Ideas, Impulsivity, Irritable Mood, Anxiety Symptoms: None reported Psychotic Symptoms: Unreported PTSD Symptoms: Negative Past Medical History:  Past Medical History  Diagnosis Date  . Acid reflux   . Recurrent UTI     with pregnancy  . SVD (spontaneous vaginal delivery)     x 5  . Bipolar 1 disorder   . PTSD (post-traumatic stress disorder)   . Chronic right hip pain   . Prediabetes   . Diabetes mellitus without complication     prediabetes    Past Surgical History  Procedure Laterality Date  . No past surgeries    . Cesarean section  05/08/2012    Procedure: CESAREAN SECTION;  Surgeon: Shelly Bombard, MD;  Location: [facility];  Service: Obstetrics;  Laterality: N/A;  Primary Cesarean Section Delivery Girl @ 4315, Apgars 9/9   Family History:  Family History  Problem Relation Age of Onset  . Anesthesia problems Neg Hx   . Diabetes Mother   . Diabetes Maternal Aunt   . Diabetes Maternal Uncle    Social History:  History  Alcohol Use  . 1.2 oz/week  . 2 Glasses of wine per week     History  Drug Use No    History   Social History  . Marital Status: Married    Spouse Name: N/A  . Number of Children: N/A  . Years of Education: N/A   Social History Main Topics  . Smoking status: Current Every Day Smoker -- 1.00 packs/day for 14 years    Types: Cigarettes    Last Attempt to Quit: 11/08/2011  . Smokeless tobacco: Never Used  . Alcohol Use: 1.2 oz/week    2 Glasses of wine per week  . Drug Use: No  . Sexual Activity: Yes    Birth Control/ Protection: None   Other Topics  Concern  . None   Social History Narrative   Hospital Course:    Major depressive disorder: Once again patient was very guarded when I attempted to clarify diagnosis as there is mention of possible bipolar disorder and PTSD. 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 For depression I will restart the patient on fluoxetine which was one of her prior to admission medications. Today I will discontinue Seroquel as this medication is abusable and at this point in time I don't see any symptoms of bipolar disorder.  Tachycardia: Patient is states she suffers from tachycardia and for this reason she was prescribed with the metoprolol 100 mg by mouth daily. Heart rate has been Bradycardic over the last couple of days, today heart rate is within the normal limits.  Insomnia: I will start the patient on trazodone 100 mg by mouth daily at bedtime when  necessary  Herpes: Continue Valtrex   Collateral information: Today I contacted the patient's husband who also wanted the patient to be discharged. He did not provide much information besides what I have already gathered from the patient. He do not know of any possible stressors other than the death of the patient's mother. He denied any knowledge of her having substance abuse issues or prior suicidal attempts.  Patient was evasive during all encounters. Patient focused on discharge refusing to participate in groups or to get labs (TSH B12).  Minimizing all symptomatology. When I attempted to gather collateral information from patient's husband I've failed that family was also minimizing the issues and attempting just to get her discharged out of the hospital. At this point in time she does not seem to be receiving any benefit from being here. Family is not concerned for her safety at this point patient is denying suicidality. He denied a prior history of suicidal attempts or substance abuse, at  least currently.  Patient will be discharged home today.   Consults:  None  Significant Diagnostic Studies:  None  Discharge Vitals:   Blood pressure 123/84, pulse 84, temperature 98.4 F (36.9 C), temperature source Oral, resp. rate 20, height '5\' 2"'  (1.575 m), weight 87.998 kg (194 lb), last menstrual period 01/12/2015, SpO2 100 %. Body mass index is 35.47 kg/(m^2). Lab Results:    Results for SAMMYE, STAFF (MRN [id number]) as of 01/17/2015 14:53  Ref. Range 01/13/2015 10:42 01/13/2015 14:10 01/13/2015 19:26  Sodium Latest Ref Range: 135-145 mmol/L 139    Potassium Latest Ref Range: 3.5-5.1 mmol/L 3.2 (L)    Chloride Latest Ref Range: 101-111 mmol/L 111    CO2 Latest Ref Range: 22-32 mmol/L 22    BUN Latest Ref Range: 6-20 mg/dL 15    Creatinine Latest Ref Range: 0.44-1.00 mg/dL 0.92    Calcium Latest Ref Range: 8.9-10.3 mg/dL 9.0    EGFR (Non-African Amer.) Latest Ref Range: >60 mL/min >60    EGFR (African American) Latest Ref Range: >60 mL/min >60    Glucose Latest Ref Range: 65-99 mg/dL 118 (H)    Anion gap Latest Ref Range: 5-15  6    Alkaline Phosphatase Latest Ref Range: 38-126 U/L 46    Albumin Latest Ref Range: 3.5-5.0 g/dL 4.1    AST Latest Ref Range: 15-41 U/L 26    ALT Latest Ref Range: 14-54 U/L 25    Total Protein Latest Ref Range: 6.5-8.1 g/dL 7.7    Total Bilirubin Latest Ref Range: 0.3-1.2 mg/dL 0.4    WBC Latest Ref Range: 3.6-11.0 K/uL 8.2    RBC Latest Ref Range: 3.80-5.20 MIL/uL 4.43    Hemoglobin Latest Ref Range: 12.0-16.0 g/dL 14.4    HCT Latest Ref Range: 35.0-47.0 % 42.0    MCV Latest Ref Range: 80.0-100.0 fL 94.7    MCH Latest Ref Range: 26.0-34.0 pg 32.4    MCHC Latest Ref Range: 32.0-36.0 g/dL 34.2    RDW Latest Ref Range: 11.5-14.5 % 12.6    Platelets Latest Ref Range: 654-650 K/uL 354    Salicylate Lvl Latest Ref Range: 2.8-30.0 mg/dL <4.0    Acetaminophen (Tylenol), S Latest Ref Range: 10-30 ug/mL 25 49 (H) 12   Results for VIELKA, KLINEDINST (MRN  [id number]) as of 01/17/2015 14:53  Ref. Range 01/13/2015 10:42 01/13/2015 10:43  Alcohol, Ethyl (B) Latest Ref Range: <5 mg/dL <5   Amphetamines, Ur Screen Latest Ref Range: NONE DETECTED   NONE DETECTED  Barbiturates, Ur Screen Latest Ref  Range: NONE DETECTED   NONE DETECTED  Benzodiazepine, Ur Scrn Latest Ref Range: NONE DETECTED   NONE DETECTED  Cocaine Metabolite,Ur [place] Latest Ref Range: NONE DETECTED   NONE DETECTED  Methadone Scn, Ur Latest Ref Range: NONE DETECTED   NONE DETECTED  MDMA (Ecstasy)Ur Screen Latest Ref Range: NONE DETECTED   NONE DETECTED  Cannabinoid 50 Ng, Ur [place] Latest Ref Range: NONE DETECTED   NONE DETECTED  Opiate, Ur Screen Latest Ref Range: NONE DETECTED   NONE DETECTED  Phencyclidine (PCP) Ur S Latest Ref Range: NONE DETECTED   NONE DETECTED  Tricyclic, Ur Screen Latest Ref Range: NONE DETECTED   NONE DETECTED   Physical Findings: AIMS: Facial and Oral Movements Muscles of Facial Expression: None, normal Lips and Perioral Area: None, normal Jaw: None, normal Tongue: None, normal,Extremity Movements Upper (arms, wrists, hands, fingers): None, normal Lower (legs, knees, ankles, toes): None, normal, Trunk Movements Neck, shoulders, hips: None, normal, Overall Severity Severity of abnormal movements (highest score from questions above): None, normal Incapacitation due to abnormal movements: None, normal Patient's awareness of abnormal movements (rate only patient's report): No Awareness, Dental Status Current problems with teeth and/or dentures?: No Does patient usually wear dentures?: No  CIWA:  CIWA-Ar Total: 0 COWS:      See Psychiatric Specialty Exam and Suicide Risk Assessment completed by Attending Physician prior to discharge.  Discharge destination:  Home  Is patient on multiple antipsychotic therapies at discharge:  No   Has Patient had three or more failed trials of antipsychotic monotherapy by history:  No    Recommended Plan for Multiple  Antipsychotic Therapies: NA  Discharge Instructions    Diet general    Complete by:  As directed             Medication List    STOP taking these medications        ARIPiprazole 15 MG tablet  Commonly known as:  ABILIFY     carbamazepine 100 MG 12 hr tablet  Commonly known as:  TEGRETOL XR     cholecalciferol 400 UNITS Tabs tablet  Commonly known as:  VITAMIN D     ciprofloxacin 500 MG tablet  Commonly known as:  CIPRO     divalproex 500 MG 24 hr tablet  Commonly known as:  DEPAKOTE ER     naltrexone 50 MG tablet  Commonly known as:  DEPADE     omeprazole 20 MG capsule  Commonly known as:  PRILOSEC     QUEtiapine 50 MG tablet  Commonly known as:  SEROQUEL      TAKE these medications      Indication   FLUoxetine 20 MG capsule  Commonly known as:  PROZAC  Take 1 capsule (20 mg total) by mouth daily.  Notes to Patient:  depression      traZODone 100 MG tablet  Commonly known as:  DESYREL  Take 1 tablet (100 mg total) by mouth at bedtime.  Notes to Patient:  insomnia      valACYclovir 1000 MG tablet  Commonly known as:  VALTREX  take 1 tablet by mouth once daily  Notes to Patient:  herpes            Follow-up Information    Follow up with [facility]. Go in 1 week.   Why:  Hospital Follow up, Outpatient Medication Management, Therapy, Walk-ins Monday, Wednesday, Friday between 8am-3pm, Please take your photo ID and Insurance card   Contact information:   [address]  [place] [address] [phone]; [phone]       Total Discharge Time: Longer than 30 minutes as this in current interview with the patient and also gathering collateral information from the patient's husband.  More than 50% of the time was as spending coordination of care.  Signed: Hildred Priest 01/17/2015, 2:45 PM

## 2015-01-17 NOTE — Progress Notes (Signed)
Recreation Therapy Notes  Date: 05.31.16 Time: 3:00 pm Location: Craft Room  Group Topic: Goal Setting  Goal Area(s) Addresses:  Patient will be able to identify one supportive statement per peer in group. Patient will verbalize benefit of setting goals. Patient will verbalize benefit of supporting peers. Patient will identify benefit of feeling supported.  Behavioral Response: Attentive, Interactive  Intervention: Step By Step  Activity: Patients were given a worksheet of a foot and instructed to list their goal inside the foot. Patients passed their worksheets around and peers wrote encouraging advice on the outside of the foot.  Education: LRT educated patients on goal setting and why it is important.  Education Outcome: Acknowledges education/In group clarification offered  Clinical Observations/Feedback: Patient wrote goal on her worksheet and encouraging words on peer's papers. Patient contributed to group discussion by stating that it was easy for her to write a personal goal, what steps she is taking to reach her goal, how it felt to get positive reinforcement from peers, that she can use peer's positive reinforcement to help motivate her to reach her goals, that it is hard to fin intrinsic motivation, but important, and how setting goals can change her life post d/c.  Jacquelynn CreeGreene,Leveon Pelzer M, LRT/CTRS 01/17/2015 4:16 PM

## 2015-01-18 ENCOUNTER — Inpatient Hospital Stay: Admission: EM | Admit: 2015-01-18 | Payer: Medicaid Other | Source: Intra-hospital | Admitting: Psychiatry

## 2015-01-18 NOTE — Progress Notes (Signed)
[facility] H&P Discharge Summary faxed to [facility] for hospital follow-up °

## 2015-08-11 ENCOUNTER — Emergency Department
Admission: EM | Admit: 2015-08-11 | Discharge: 2015-08-11 | Disposition: A | Discharge status: Home / Self Care | Payer: Self-pay | Attending: Emergency Medicine | Admitting: Emergency Medicine

## 2015-08-11 ENCOUNTER — Emergency Department: Payer: Medicaid Other

## 2015-08-11 ENCOUNTER — Encounter: Payer: Self-pay | Admitting: Radiology

## 2015-08-11 DIAGNOSIS — K9289 Other specified diseases of the digestive system: Secondary | ICD-10-CM | POA: Insufficient documentation

## 2015-08-11 DIAGNOSIS — R1110 Vomiting, unspecified: Secondary | ICD-10-CM | POA: Insufficient documentation

## 2015-08-11 DIAGNOSIS — Z91040 Latex allergy status: Secondary | ICD-10-CM | POA: Insufficient documentation

## 2015-08-11 DIAGNOSIS — Z3202 Encounter for pregnancy test, result negative: Secondary | ICD-10-CM | POA: Insufficient documentation

## 2015-08-11 DIAGNOSIS — Z79899 Other long term (current) drug therapy: Secondary | ICD-10-CM | POA: Insufficient documentation

## 2015-08-11 DIAGNOSIS — F17210 Nicotine dependence, cigarettes, uncomplicated: Secondary | ICD-10-CM | POA: Insufficient documentation

## 2015-08-11 DIAGNOSIS — R197 Diarrhea, unspecified: Secondary | ICD-10-CM | POA: Insufficient documentation

## 2015-08-11 DIAGNOSIS — R109 Unspecified abdominal pain: Secondary | ICD-10-CM

## 2015-08-11 DIAGNOSIS — N390 Urinary tract infection, site not specified: Secondary | ICD-10-CM | POA: Insufficient documentation

## 2015-08-11 DIAGNOSIS — Z880 Allergy status to penicillin: Secondary | ICD-10-CM | POA: Insufficient documentation

## 2015-08-11 DIAGNOSIS — E876 Hypokalemia: Secondary | ICD-10-CM | POA: Insufficient documentation

## 2015-08-11 HISTORY — DX: Unspecified asthma, uncomplicated: J45.909

## 2015-08-11 LAB — COMPREHENSIVE METABOLIC PANEL
ALBUMIN: 4.4 g/dL (ref 3.5–5.0)
ALT: 13 U/L — ABNORMAL LOW (ref 14–54)
AST: 12 U/L — AB (ref 15–41)
Alkaline Phosphatase: 36 U/L — ABNORMAL LOW (ref 38–126)
Anion gap: 10 (ref 5–15)
BILIRUBIN TOTAL: 0.7 mg/dL (ref 0.3–1.2)
BUN: 13 mg/dL (ref 6–20)
CHLORIDE: 103 mmol/L (ref 101–111)
CO2: 24 mmol/L (ref 22–32)
Calcium: 9.4 mg/dL (ref 8.9–10.3)
Creatinine, Ser: 0.93 mg/dL (ref 0.44–1.00)
GFR calc Af Amer: >60 mL/min (ref >60)
GFR calc non Af Amer: >60 mL/min (ref >60)
GLUCOSE: 91 mg/dL (ref 65–99)
POTASSIUM: 3.3 mmol/L — AB (ref 3.5–5.1)
SODIUM: 137 mmol/L (ref 135–145)
TOTAL PROTEIN: 7.8 g/dL (ref 6.5–8.1)

## 2015-08-11 LAB — URINALYSIS COMPLETE WITH MICROSCOPIC (ARMC ONLY)
Bilirubin Urine: NEGATIVE
GLUCOSE, UA: NEGATIVE mg/dL
Ketones, ur: NEGATIVE mg/dL
Nitrite: NEGATIVE
PROTEIN: NEGATIVE mg/dL
Specific Gravity, Urine: 1.027 (ref 1.005–1.030)
pH: 5 (ref 5.0–8.0)

## 2015-08-11 LAB — CBC
HEMATOCRIT: 40.9 % (ref 35.0–47.0)
Hemoglobin: 14 g/dL (ref 12.0–16.0)
MCH: 34.2 pg — AB (ref 26.0–34.0)
MCHC: 34.3 g/dL (ref 32.0–36.0)
MCV: 99.7 fL (ref 80.0–100.0)
Platelets: 258 10*3/uL (ref 150–440)
RBC: 4.1 MIL/uL (ref 3.80–5.20)
RDW: 12.9 % (ref 11.5–14.5)
WBC: 13 10*3/uL — ABNORMAL HIGH (ref 3.6–11.0)

## 2015-08-11 LAB — HCG, QUANTITATIVE, PREGNANCY

## 2015-08-11 LAB — POCT PREGNANCY, URINE: PREG TEST UR: NEGATIVE

## 2015-08-11 MED ORDER — IOHEXOL 240 MG/ML SOLN
25.0000 mL | Freq: Once | INTRAMUSCULAR | Status: AC | PRN
  Administered 2015-08-11: 25 mL via ORAL

## 2015-08-11 MED ORDER — ONDANSETRON HCL 4 MG PO TABS: ORAL_TABLET | ORAL | Status: DC

## 2015-08-11 MED ORDER — CEPHALEXIN 500 MG PO CAPS: 500.0000 mg | ORAL_CAPSULE | Freq: Three times a day (TID) | ORAL | Status: DC

## 2015-08-11 MED ORDER — IOHEXOL 300 MG/ML  SOLN
100.0000 mL | Freq: Once | INTRAMUSCULAR | Status: AC | PRN
  Administered 2015-08-11: 100 mL via INTRAVENOUS

## 2015-08-11 MED ORDER — SODIUM CHLORIDE 0.9 % IV BOLUS (SEPSIS)
1000.0000 mL | Freq: Once | INTRAVENOUS | Status: AC
  Administered 2015-08-11: 1000 mL via INTRAVENOUS

## 2015-08-11 MED ORDER — ONDANSETRON HCL 4 MG/2ML IJ SOLN
4.0000 mg | Freq: Once | INTRAMUSCULAR | Status: AC
  Administered 2015-08-11: 4 mg via INTRAVENOUS
  Filled 2015-08-11: qty 2

## 2015-08-11 MED ORDER — POTASSIUM CHLORIDE CRYS ER 20 MEQ PO TBCR: 20.0000 meq | EXTENDED_RELEASE_TABLET | Freq: Every day | ORAL | Status: DC

## 2015-08-11 NOTE — ED Provider Notes (Signed)
[facility] Emergency Department Provider Note  ____________________________________________   I have reviewed the triage vital signs and the nursing notes.   HISTORY  Chief Complaint Abdominal Pain and Diarrhea    HPI Jenny GUTTIERREZ is a 29 y.o. female who states she has not had abdominal surgery aside from a C-section years ago presents today complaining of abdominal pain and vomiting and diarrhea. She states she has been gagging but not actually vomiting anything terms of food. This is been going on for 2 days. She has had multiple episodes of nonbloody and non-melanotic stool. She has pain localizing to the left lower quadrant  Past Medical History  Diagnosis Date  . Acid reflux   . Recurrent UTI     with pregnancy  . SVD (spontaneous vaginal delivery)     x 5  . Bipolar 1 disorder   . PTSD (post-traumatic stress disorder)   . Chronic right hip pain   . Prediabetes   . Diabetes mellitus without complication     prediabetes    Patient Active Problem List   Diagnosis Date Noted  . Major depressive disorder, recurrent, severe without psychotic features (HCC)   . PTSD (post-traumatic stress disorder) 04/22/2013  . Cesarean delivery, without mention of indication, delivered, with or without mention of antepartum condition 05/11/2012    Past Surgical History  Procedure Laterality Date  . No past surgeries    . Cesarean section  05/08/2012    Procedure: CESAREAN SECTION;  Surgeon: Brock Bad, MD;  Location: [facility];  Service: Obstetrics;  Laterality: N/A;  Primary Cesarean Section Delivery Girl @ 1555, Apgars 9/9    Current Outpatient Rx  Name  Route  Sig  Dispense  Refill  . FLUoxetine (PROZAC) 20 MG capsule   Oral   Take 1 capsule (20 mg total) by mouth daily.   30 capsule   0   . traZODone (DESYREL) 100 MG tablet   Oral   Take 1 tablet (100 mg total) by mouth at bedtime.   30 tablet   0   . valACYclovir (VALTREX) 1000 MG tablet     take 1 tablet by mouth once daily   30 tablet   10     Allergies Latex; Penicillins; and Pineapple  Family History  Problem Relation Age of Onset  . Anesthesia problems Neg Hx   . Diabetes Mother   . Diabetes Maternal Aunt   . Diabetes Maternal Uncle     Social History Social History  Substance Use Topics  . Smoking status: Current Every Day Smoker -- 1.00 packs/day for 14 years    Types: Cigarettes    Last Attempt to Quit: 11/08/2011  . Smokeless tobacco: Never Used  . Alcohol Use: 1.2 oz/week    2 Glasses of wine per week    Review of Systems Constitutional: No fever/chills Eyes: No visual changes. ENT: No sore throat. No stiff neck no neck pain Cardiovascular: Denies chest pain. Respiratory: Denies shortness of breath. Gastrointestinal:   no vomiting.  No diarrhea.  No constipation. Genitourinary: Negative for dysuria. Musculoskeletal: Negative lower extremity swelling Skin: Negative for rash. Neurological: Negative for headaches, focal weakness or numbness. 10-point ROS otherwise negative.  ____________________________________________   PHYSICAL EXAM:  VITAL SIGNS: ED Triage Vitals  Enc Vitals Group     BP 08/11/15 2023 137/82 mmHg     Pulse Rate 08/11/15 2023 90     Resp 08/11/15 2023 16     Temp 08/11/15 2023  98.4 F (36.9 C)     Temp Source 08/11/15 2023 Oral     SpO2 08/11/15 2023 100 %     Weight 08/11/15 2023 180 lb (81.647 kg)     Height 08/11/15 2023 5\' 2"  (1.575 m)     Head Cir --      Peak Flow --      Pain Score 08/11/15 2023 10     Pain Loc --      Pain Edu? --      Excl. in GC? --     Constitutional: Alert and oriented. Well appearing and in no acute distress. Eyes: Conjunctivae are normal. PERRL. EOMI. Head: Atraumatic. Nose: No congestion/rhinnorhea. Mouth/Throat: Mucous membranes are moist.  Oropharynx non-erythematous. Neck: No stridor.   Nontender with no meningismus Cardiovascular: Normal rate, regular rhythm. Grossly  normal heart sounds.  Good peripheral circulation. Respiratory: Normal respiratory effort.  No retractions. Lungs CTAB. Abdominal: Soft, no distention, there is left lower quadrant discomfort to palpation with no rebound or involuntary guarding Back:  There is no focal tenderness or step off there is no midline tenderness there are no lesions noted. there is no CVA tenderness Musculoskeletal: No lower extremity tenderness. No joint effusions, no DVT signs strong distal pulses no edema Neurologic:  Normal speech and language. No gross focal neurologic deficits are appreciated.  Skin:  Skin is warm, dry and intact. No rash noted. Psychiatric: Mood and affect are normal. Speech and behavior are normal.  ____________________________________________   LABS (all labs ordered are listed, but only abnormal results are displayed)  Labs Reviewed  COMPREHENSIVE METABOLIC PANEL - Abnormal; Notable for the following:    Potassium 3.3 (*)    AST 12 (*)    ALT 13 (*)    Alkaline Phosphatase 36 (*)    All other components within normal limits  CBC - Abnormal; Notable for the following:    WBC 13.0 (*)    MCH 34.2 (*)    All other components within normal limits  URINALYSIS COMPLETEWITH MICROSCOPIC ([facility] ONLY)  POC URINE PREG, ED   ____________________________________________  EKG  I personally interpreted any EKGs ordered by me or triage  ____________________________________________  RADIOLOGY  I reviewed any imaging ordered by me or triage that were performed during my shift ____________________________________________   PROCEDURES  Procedure(s) performed: None  Critical Care performed: None  ____________________________________________   INITIAL IMPRESSION / ASSESSMENT AND PLAN / ED COURSE  Pertinent labs & imaging results that were available during my care of the patient were reviewed by me and considered in my medical decision making (see chart for details).  H with focal  left lower quadrant pain in the context of a diarrheal illness. Most likely this is viral but given the focality of her pain will obtain imaging to her to ensure that there is no diverticular other pathology. Given prominence of diarrhea and gastric symptoms I have low suspicion for ovarian cyst or PID. ____________________________________________   FINAL CLINICAL IMPRESSION(S) / ED DIAGNOSES  Final diagnoses:  None     Jeanmarie PlantJames A McShane, MD 08/11/15 2118

## 2015-08-11 NOTE — ED Notes (Signed)
Patient transported to CT 

## 2015-08-11 NOTE — Discharge Instructions (Signed)
You have been seen in the Emergency Department (ED) for abdominal pain.  Your evaluation did not identify a clear cause of your symptoms but was generally reassuring.  Your symptoms are likely caused by viral infection, although it appears you may have a urinary tract infection as well which could be contributing to your symptoms.  Please follow up as instructed above regarding todays emergent visit and the symptoms that are bothering you.  Return to the ED if your abdominal pain worsens or fails to improve, you develop bloody vomiting, bloody diarrhea, you are unable to tolerate fluids due to vomiting, fever greater than 101, or other symptoms that concern you.   Abdominal Pain, Adult Many things can cause abdominal pain. Usually, abdominal pain is not caused by a disease and will improve without treatment. It can often be observed and treated at home. Your health care provider will do a physical exam and possibly order blood tests and X-rays to help determine the seriousness of your pain. However, in many cases, more time must pass before a clear cause of the pain can be found. Before that point, your health care provider may not know if you need more testing or further treatment. HOME CARE INSTRUCTIONS Monitor your abdominal pain for any changes. The following actions may help to alleviate any discomfort you are experiencing:  Only take over-the-counter or prescription medicines as directed by your health care provider.  Do not take laxatives unless directed to do so by your health care provider.  Try a clear liquid diet (broth, tea, or water) as directed by your health care provider. Slowly move to a bland diet as tolerated. SEEK MEDICAL CARE IF:  You have unexplained abdominal pain.  You have abdominal pain associated with nausea or diarrhea.  You have pain when you urinate or have a bowel movement.  You experience abdominal pain that wakes you in the night.  You have abdominal pain  that is worsened or improved by eating food.  You have abdominal pain that is worsened with eating fatty foods.  You have a fever. SEEK IMMEDIATE MEDICAL CARE IF:  Your pain does not go away within 2 hours.  You keep throwing up (vomiting).  Your pain is felt only in portions of the abdomen, such as the right side or the left lower portion of the abdomen.  You pass bloody or black tarry stools. MAKE SURE YOU:  Understand these instructions.  Will watch your condition.  Will get help right away if you are not doing well or get worse.   This information is not intended to replace advice given to you by your health care provider. Make sure you discuss any questions you have with your health care provider.   Document Released: 05/15/2005 Document Revised: 04/26/2015 Document Reviewed: 04/14/2013 Elsevier Interactive Patient Education 2016 [employer]  Diarrhea Diarrhea is frequent loose and watery bowel movements. It can cause you to feel weak and dehydrated. Dehydration can cause you to become tired and thirsty, have a dry mouth, and have decreased urination that often is dark yellow. Diarrhea is a sign of another problem, most often an infection that will not last long. In most cases, diarrhea typically lasts 2-3 days. However, it can last longer if it is a sign of something more serious. It is important to treat your diarrhea as directed by your caregiver to lessen or prevent future episodes of diarrhea. CAUSES  Some common causes include:  Gastrointestinal infections caused by viruses, bacteria, or parasites.  Food poisoning or food allergies.  Certain medicines, such as antibiotics, chemotherapy, and laxatives.  Artificial sweeteners and fructose.  Digestive disorders. HOME CARE INSTRUCTIONS  Ensure adequate fluid intake (hydration): Have 1 cup (8 oz) of fluid for each diarrhea episode. Avoid fluids that contain simple sugars or sports drinks, fruit juices, whole milk  products, and sodas. Your urine should be clear or pale yellow if you are drinking enough fluids. Hydrate with an oral rehydration solution that you can purchase at pharmacies, retail stores, and online. You can prepare an oral rehydration solution at home by mixing the following ingredients together:   - tsp table salt.   tsp baking soda.   tsp salt substitute containing potassium chloride.  1  tablespoons sugar.  1 L (34 oz) of water.  Certain foods and beverages may increase the speed at which food moves through the gastrointestinal (GI) tract. These foods and beverages should be avoided and include:  Caffeinated and alcoholic beverages.  High-fiber foods, such as raw fruits and vegetables, nuts, seeds, and whole grain breads and cereals.  Foods and beverages sweetened with sugar alcohols, such as xylitol, sorbitol, and mannitol.  Some foods may be well tolerated and may help thicken stool including:  Starchy foods, such as rice, toast, pasta, low-sugar cereal, oatmeal, grits, baked potatoes, crackers, and bagels.  Bananas.  Applesauce.  Add probiotic-rich foods to help increase healthy bacteria in the GI tract, such as yogurt and fermented milk products.  Wash your hands well after each diarrhea episode.  Only take over-the-counter or prescription medicines as directed by your caregiver.  Take a warm bath to relieve any burning or pain from frequent diarrhea episodes. SEEK IMMEDIATE MEDICAL CARE IF:   You are unable to keep fluids down.  You have persistent vomiting.  You have blood in your stool, or your stools are black and tarry.  You do not urinate in 6-8 hours, or there is only a small amount of very dark urine.  You have abdominal pain that increases or localizes.  You have weakness, dizziness, confusion, or light-headedness.  You have a severe headache.  Your diarrhea gets worse or does not get better.  You have a fever or persistent symptoms for more  than 2-3 days.  You have a fever and your symptoms suddenly get worse. MAKE SURE YOU:   Understand these instructions.  Will watch your condition.  Will get help right away if you are not doing well or get worse.   This information is not intended to replace advice given to you by your health care provider. Make sure you discuss any questions you have with your health care provider.   Document Released: 07/26/2002 Document Revised: 08/26/2014 Document Reviewed: 04/12/2012 Elsevier Interactive Patient Education 2016 [employer].  Food Choices to Help Relieve Diarrhea, Adult When you have diarrhea, the foods you eat and your eating habits are very important. Choosing the right foods and drinks can help relieve diarrhea. Also, because diarrhea can last up to 7 days, you need to replace lost fluids and electrolytes (such as sodium, potassium, and chloride) in order to help prevent dehydration.  WHAT GENERAL GUIDELINES DO I NEED TO FOLLOW?  Slowly drink 1 cup (8 oz) of fluid for each episode of diarrhea. If you are getting enough fluid, your urine will be clear or pale yellow.  Eat starchy foods. Some good choices include white rice, white toast, pasta, low-fiber cereal, baked potatoes (without the skin), saltine crackers, and bagels.  Avoid  large servings of any cooked vegetables.  Limit fruit to two servings per day. A serving is  cup or 1 small piece.  Choose foods with less than 2 g of fiber per serving.  Limit fats to less than 8 tsp (38 g) per day.  Avoid fried foods.  Eat foods that have probiotics in them. Probiotics can be found in certain dairy products.  Avoid foods and beverages that may increase the speed at which food moves through the stomach and intestines (gastrointestinal tract). Things to avoid include:  High-fiber foods, such as dried fruit, raw fruits and vegetables, nuts, seeds, and whole grain foods.  Spicy foods and high-fat foods.  Foods and beverages  sweetened with high-fructose corn syrup, honey, or sugar alcohols such as xylitol, sorbitol, and mannitol. WHAT FOODS ARE RECOMMENDED? Grains White rice. White, [place], or pita breads (fresh or toasted), including plain rolls, buns, or bagels. White pasta. Saltine, soda, or graham crackers. Pretzels. Low-fiber cereal. Cooked cereals made with water (such as cornmeal, farina, or cream cereals). Plain muffins. Matzo. Melba toast. Zwieback.  Vegetables Potatoes (without the skin). Strained tomato and vegetable juices. Most well-cooked and canned vegetables without seeds. Tender lettuce. Fruits Cooked or canned applesauce, apricots, cherries, fruit cocktail, grapefruit, peaches, pears, or plums. Fresh bananas, apples without skin, cherries, grapes, cantaloupe, grapefruit, peaches, oranges, or plums.  Meat and Other Protein Products Baked or boiled chicken. Eggs. Tofu. Fish. Seafood. Smooth peanut butter. Ground or well-cooked tender beef, ham, veal, lamb, pork, or poultry.  Dairy Plain yogurt, kefir, and unsweetened liquid yogurt. Lactose-free milk, buttermilk, or soy milk. Plain hard cheese. Beverages Sport drinks. Clear broths. Diluted fruit juices (except prune). Regular, caffeine-free sodas such as ginger ale. Water. Decaffeinated teas. Oral rehydration solutions. Sugar-free beverages not sweetened with sugar alcohols. Other Bouillon, broth, or soups made from recommended foods.  The items listed above may not be a complete list of recommended foods or beverages. Contact your dietitian for more options. WHAT FOODS ARE NOT RECOMMENDED? Grains Whole grain, whole wheat, bran, or rye breads, rolls, pastas, crackers, and cereals. Wild or brown rice. Cereals that contain more than 2 g of fiber per serving. Corn tortillas or taco shells. Cooked or dry oatmeal. Granola. Popcorn. Vegetables Raw vegetables. Cabbage, broccoli, Brussels sprouts, artichokes, baked beans, beet greens, corn, kale, legumes,  peas, sweet potatoes, and yams. Potato skins. Cooked spinach and cabbage. Fruits Dried fruit, including raisins and dates. Raw fruits. Stewed or dried prunes. Fresh apples with skin, apricots, mangoes, pears, raspberries, and strawberries.  Meat and Other Protein Products Chunky peanut butter. Nuts and seeds. Beans and lentils. Tomasa BlaseBacon.  Dairy High-fat cheeses. Milk, chocolate milk, and beverages made with milk, such as milk shakes. Cream. Ice cream. Sweets and Desserts Sweet rolls, doughnuts, and sweet breads. Pancakes and waffles. Fats and Oils Butter. Cream sauces. Margarine. Salad oils. Plain salad dressings. Olives. Avocados.  Beverages Caffeinated beverages (such as coffee, tea, soda, or energy drinks). Alcoholic beverages. Fruit juices with pulp. Prune juice. Soft drinks sweetened with high-fructose corn syrup or sugar alcohols. Other Coconut. Hot sauce. Chili powder. Mayonnaise. Gravy. Cream-based or milk-based soups.  The items listed above may not be a complete list of foods and beverages to avoid. Contact your dietitian for more information. WHAT SHOULD I DO IF I BECOME DEHYDRATED? Diarrhea can sometimes lead to dehydration. Signs of dehydration include dark urine and dry mouth and skin. If you think you are dehydrated, you should rehydrate with an oral rehydration solution. These solutions  can be purchased at pharmacies, retail stores, or online.  Drink -1 cup (120-240 mL) of oral rehydration solution each time you have an episode of diarrhea. If drinking this amount makes your diarrhea worse, try drinking smaller amounts more often. For example, drink 1-3 tsp (5-15 mL) every 5-10 minutes.  A general rule for staying hydrated is to drink 1-2 L of fluid per day. Talk to your health care provider about the specific amount you should be drinking each day. Drink enough fluids to keep your urine clear or pale yellow.   This information is not intended to replace advice given to you by  your health care provider. Make sure you discuss any questions you have with your health care provider.   Document Released: 10/26/2003 Document Revised: 08/26/2014 Document Reviewed: 06/28/2013 Elsevier Interactive Patient Education 2016 [employer].  Hypokalemia Hypokalemia means that the amount of potassium in the blood is lower than normal.Potassium is a chemical, called an electrolyte, that helps regulate the amount of fluid in the body. It also stimulates muscle contraction and helps nerves function properly.Most of the body's potassium is inside of cells, and only a very small amount is in the blood. Because the amount in the blood is so small, minor changes can be life-threatening. CAUSES  Antibiotics.  Diarrhea or vomiting.  Using laxatives too much, which can cause diarrhea.  Chronic kidney disease.  Water pills (diuretics).  Eating disorders (bulimia).  Low magnesium level.  Sweating a lot. SIGNS AND SYMPTOMS  Weakness.  Constipation.  Fatigue.  Muscle cramps.  Mental confusion.  Skipped heartbeats or irregular heartbeat (palpitations).  Tingling or numbness. DIAGNOSIS  Your health care provider can diagnose hypokalemia with blood tests. In addition to checking your potassium level, your health care provider may also check other lab tests. TREATMENT Hypokalemia can be treated with potassium supplements taken by mouth or adjustments in your current medicines. If your potassium level is very low, you may need to get potassium through a vein (IV) and be monitored in the hospital. A diet high in potassium is also helpful. Foods high in potassium are:  Nuts, such as peanuts and pistachios.  Seeds, such as sunflower seeds and pumpkin seeds.  Peas, lentils, and lima beans.  Whole grain and bran cereals and breads.  Fresh fruit and vegetables, such as apricots, avocado, bananas, cantaloupe, kiwi, oranges, tomatoes, asparagus, and potatoes.  Orange and  tomato juices.  Red meats.  Fruit yogurt. HOME CARE INSTRUCTIONS  Take all medicines as prescribed by your health care provider.  Maintain a healthy diet by including nutritious food, such as fruits, vegetables, nuts, whole grains, and lean meats.  If you are taking a laxative, be sure to follow the directions on the label. SEEK MEDICAL CARE IF:  Your weakness gets worse.  You feel your heart pounding or racing.  You are vomiting or having diarrhea.  You are diabetic and having trouble keeping your blood glucose in the normal range. SEEK IMMEDIATE MEDICAL CARE IF:  You have chest pain, shortness of breath, or dizziness.  You are vomiting or having diarrhea for more than 2 days.  You faint. MAKE SURE YOU:   Understand these instructions.  Will watch your condition.  Will get help right away if you are not doing well or get worse.   This information is not intended to replace advice given to you by your health care provider. Make sure you discuss any questions you have with your health care provider.  Document Released: 08/05/2005 Document Revised: 08/26/2014 Document Reviewed: 02/05/2013 Elsevier Interactive Patient Education 2016 Elsevier Inc.  Urinary Tract Infection Urinary tract infections (UTIs) can develop anywhere along your urinary tract. Your urinary tract is your body's drainage system for removing wastes and extra water. Your urinary tract includes two kidneys, two ureters, a bladder, and a urethra. Your kidneys are a pair of bean-shaped organs. Each kidney is about the size of your fist. They are located below your ribs, one on each side of your spine. CAUSES Infections are caused by microbes, which are microscopic organisms, including fungi, viruses, and bacteria. These organisms are so small that they can only be seen through a microscope. Bacteria are the microbes that most commonly cause UTIs. SYMPTOMS  Symptoms of UTIs may vary by age and gender of the  patient and by the location of the infection. Symptoms in young women typically include a frequent and intense urge to urinate and a painful, burning feeling in the bladder or urethra during urination. Older women and men are more likely to be tired, shaky, and weak and have muscle aches and abdominal pain. A fever may mean the infection is in your kidneys. Other symptoms of a kidney infection include pain in your back or sides below the ribs, nausea, and vomiting. DIAGNOSIS To diagnose a UTI, your caregiver will ask you about your symptoms. Your caregiver will also ask you to provide a urine sample. The urine sample will be tested for bacteria and white blood cells. White blood cells are made by your body to help fight infection. TREATMENT  Typically, UTIs can be treated with medication. Because most UTIs are caused by a bacterial infection, they usually can be treated with the use of antibiotics. The choice of antibiotic and length of treatment depend on your symptoms and the type of bacteria causing your infection. HOME CARE INSTRUCTIONS  If you were prescribed antibiotics, take them exactly as your caregiver instructs you. Finish the medication even if you feel better after you have only taken some of the medication.  Drink enough water and fluids to keep your urine clear or pale yellow.  Avoid caffeine, tea, and carbonated beverages. They tend to irritate your bladder.  Empty your bladder often. Avoid holding urine for long periods of time.  Empty your bladder before and after sexual intercourse.  After a bowel movement, women should cleanse from front to back. Use each tissue only once. SEEK MEDICAL CARE IF:   You have back pain.  You develop a fever.  Your symptoms do not begin to resolve within 3 days. SEEK IMMEDIATE MEDICAL CARE IF:   You have severe back pain or lower abdominal pain.  You develop chills.  You have nausea or vomiting.  You have continued burning or  discomfort with urination. MAKE SURE YOU:   Understand these instructions.  Will watch your condition.  Will get help right away if you are not doing well or get worse.   This information is not intended to replace advice given to you by your health care provider. Make sure you discuss any questions you have with your health care provider.   Document Released: 05/15/2005 Document Revised: 04/26/2015 Document Reviewed: 09/13/2011 Elsevier Interactive Patient Education [employer]

## 2015-08-11 NOTE — ED Notes (Signed)
Pt states two days of nausea, diarrhea and  umbilicar abd pain with headache.

## 2015-08-11 NOTE — ED Provider Notes (Signed)
-----------------------------------------   10:00 PM on 08/11/2015 -----------------------------------------   Blood pressure 129/83, pulse 70, temperature 98.4 F (36.9 C), temperature source Oral, resp. rate 16, height 5\' 2"  (1.575 m), weight 81.647 kg, last menstrual period 07/03/2015, SpO2 100 %.  Assuming care from Dr. Alphonzo LemmingsMcShane.  In short, Jenny Baldwin is a 29 y.o. female with a chief complaint of Abdominal Pain and Diarrhea .  Refer to the original H&P for additional details.  The current plan of care is to follow-up with the CT scan and dispo appropriately.  ----------------------------------------- 11:02 PM on 08/11/2015 -----------------------------------------  The patient's CT scan is unremarkable.  She is lying in bed comfortably intact, texting without any acute distress.  I gave her the results of her scan.  Her urinalysis is suspicious for a UTI.  I am sending a urine culture and I will treat her empirically; she has hematuria and leukocytes with "many" bacteria and states that she is not currently on her period, so believe it is appropriate to treat.  Also giving potassium supplement Rx.  Loleta Roseory Kara Mierzejewski, MD 08/11/15 [phone]

## 2015-08-14 LAB — URINE CULTURE: Special Requests: NORMAL

## 2015-10-04 ENCOUNTER — Emergency Department: Payer: Medicaid Other

## 2015-10-04 ENCOUNTER — Emergency Department
Admission: EM | Admit: 2015-10-04 | Discharge: 2015-10-04 | Disposition: A | Discharge status: Home / Self Care | Payer: Medicaid Other | Attending: Emergency Medicine | Admitting: Emergency Medicine

## 2015-10-04 ENCOUNTER — Encounter: Payer: Self-pay | Admitting: Emergency Medicine

## 2015-10-04 DIAGNOSIS — Z91040 Latex allergy status: Secondary | ICD-10-CM | POA: Insufficient documentation

## 2015-10-04 DIAGNOSIS — Y9289 Other specified places as the place of occurrence of the external cause: Secondary | ICD-10-CM | POA: Insufficient documentation

## 2015-10-04 DIAGNOSIS — W2209XA Striking against other stationary object, initial encounter: Secondary | ICD-10-CM | POA: Insufficient documentation

## 2015-10-04 DIAGNOSIS — Z880 Allergy status to penicillin: Secondary | ICD-10-CM | POA: Insufficient documentation

## 2015-10-04 DIAGNOSIS — F17210 Nicotine dependence, cigarettes, uncomplicated: Secondary | ICD-10-CM | POA: Insufficient documentation

## 2015-10-04 DIAGNOSIS — Y998 Other external cause status: Secondary | ICD-10-CM | POA: Insufficient documentation

## 2015-10-04 DIAGNOSIS — Z792 Long term (current) use of antibiotics: Secondary | ICD-10-CM | POA: Insufficient documentation

## 2015-10-04 DIAGNOSIS — S60221A Contusion of right hand, initial encounter: Secondary | ICD-10-CM

## 2015-10-04 DIAGNOSIS — Z79899 Other long term (current) drug therapy: Secondary | ICD-10-CM | POA: Insufficient documentation

## 2015-10-04 DIAGNOSIS — Y9389 Activity, other specified: Secondary | ICD-10-CM | POA: Insufficient documentation

## 2015-10-04 MED ORDER — HYDROCODONE-ACETAMINOPHEN 5-325 MG PO TABS: 1.0000 | ORAL_TABLET | ORAL | Status: DC | PRN

## 2015-10-04 MED ORDER — DOCUSATE SODIUM 100 MG PO CAPS: ORAL_CAPSULE | ORAL | Status: DC

## 2015-10-04 NOTE — ED Notes (Signed)
Pt presents to ED with c/o right hand pain after hitting a wall 30 min PTA. Pt c/o burning sensation and shooting pain in left hand. States unable to move 4th and fifth digit due to pain. Swelling noted to right hand. Pt denies other complaints at this time. Pt alert and oriented x 4, no increased work in breathing noted.

## 2015-10-04 NOTE — ED Notes (Signed)
Patient states she punched a wall about 30 minutes ago. C/o left hand pain.

## 2015-10-04 NOTE — ED Provider Notes (Signed)
[facility] Emergency Department Provider Note  ____________________________________________  Time seen: Approximately 10:40 PM  I have reviewed the triage vital signs and the nursing notes.   HISTORY  Chief Complaint Hand Injury    HPI Jenny Baldwin is a 30 y.o. female with a past medical history includes bipolar disorder, PTSD, chronic right hip pain, and prior foot injury.  She presents for evaluation of right hand pain after she punched a concrete wall.  She states that she was angry and punched a wall with her right, dominant hand.  She had acute onset of severe pain primarily in her fourth and fifth digits.  She has worsening pain with flexion and extension of the fingers and movement of the hand.  She states that she has some tingling and burning sensation in the hand as well.  She has no pain proximal to the hand including the wrist.  He did not sustain any other injuries.  Nothing is making the pain better.  Movement makes the pain worse.   Past Medical History  Diagnosis Date  . Acid reflux   . Recurrent UTI     with pregnancy  . SVD (spontaneous vaginal delivery)     x 5  . Bipolar 1 disorder (HCC)   . PTSD (post-traumatic stress disorder)   . Chronic right hip pain   . Prediabetes   . Asthma     Patient Active Problem List   Diagnosis Date Noted  . Major depressive disorder, recurrent, severe without psychotic features (HCC)   . PTSD (post-traumatic stress disorder) 04/22/2013  . Cesarean delivery, without mention of indication, delivered, with or without mention of antepartum condition 05/11/2012    Past Surgical History  Procedure Laterality Date  . No past surgeries    . Cesarean section  05/08/2012    Procedure: CESAREAN SECTION;  Surgeon: Brock Bad, MD;  Location: [facility];  Service: Obstetrics;  Laterality: N/A;  Primary Cesarean Section Delivery Girl @ 1555, Apgars 9/9    Current Outpatient Rx  Name  Route  Sig  Dispense   Refill  . cephALEXin (KEFLEX) 500 MG capsule   Oral   Take 1 capsule (500 mg total) by mouth 3 (three) times daily.   36 capsule   0   . docusate sodium (COLACE) 100 MG capsule      Take 1 tablet once or twice daily as needed for constipation while taking narcotic pain medicine   30 capsule   0   . FLUoxetine (PROZAC) 20 MG capsule   Oral   Take 1 capsule (20 mg total) by mouth daily.   30 capsule   0   . HYDROcodone-acetaminophen (NORCO/VICODIN) 5-325 MG tablet   Oral   Take 1-2 tablets by mouth every 4 (four) hours as needed for moderate pain.   15 tablet   0   . ondansetron (ZOFRAN) 4 MG tablet      Take 1-2 tabs by mouth every 8 hours as needed for nausea/vomiting   30 tablet   0   . potassium chloride SA (KLOR-CON M20) 20 MEQ tablet   Oral   Take 1 tablet (20 mEq total) by mouth daily.   7 tablet   0   . traZODone (DESYREL) 100 MG tablet   Oral   Take 1 tablet (100 mg total) by mouth at bedtime.   30 tablet   0   . valACYclovir (VALTREX) 1000 MG tablet      take  1 tablet by mouth once daily   30 tablet   10     Allergies Latex; Penicillins; and Pineapple  Family History  Problem Relation Age of Onset  . Anesthesia problems Neg Hx   . Diabetes Mother   . Diabetes Maternal Aunt   . Diabetes Maternal Uncle     Social History Social History  Substance Use Topics  . Smoking status: Current Every Day Smoker -- 1.00 packs/day for 14 years    Types: Cigarettes    Last Attempt to Quit: 11/08/2011  . Smokeless tobacco: Never Used  . Alcohol Use: 1.2 oz/week    2 Glasses of wine per week    Review of Systems Constitutional: No fever/chills Eyes: No visual changes. ENT: No sore throat. Cardiovascular: Denies chest pain. Respiratory: Denies shortness of breath. Musculoskeletal: Severe pain in right hand primarily in the fourth and fifth digits. Skin: Negative for rash. Neurological: Negative for headaches, focal weakness or  numbness.   ____________________________________________   PHYSICAL EXAM:  VITAL SIGNS: ED Triage Vitals  Enc Vitals Group     BP 10/04/15 2119 145/96 mmHg     Pulse Rate 10/04/15 2119 116     Resp 10/04/15 2119 18     Temp 10/04/15 2119 98.2 F (36.8 C)     Temp Source 10/04/15 2119 Oral     SpO2 10/04/15 2119 94 %     Weight 10/04/15 2119 179 lb (81.194 kg)     Height 10/04/15 2119  (1.575 m)     Head Cir --      Peak Flow --      Pain Score 10/04/15 2120 10     Pain Loc --      Pain Edu? --      Excl. in GC? --     Constitutional: Alert and oriented. Well appearing and in no acute distress. Eyes: Conjunctivae are normal. PERRL. EOMI. Head: Atraumatic. Cardiovascular: Normal rate, regular rhythm. Good peripheral circulation. Respiratory: Normal respiratory effort.  No retractions.  Musculoskeletal: The patient has mild swelling to the dorsal aspect of her hands most notable in the area proximal to her fourth and fifth digits.  There is no obvious ecchymosis.  There is tenderness to palpation throughout that side of the hand but no snuffbox tenderness and no pain or tenderness in the thumb, index finger, her middle finger.  She has full range of motion with all the fingers but they are tender when she makes a fist.  She has no wrist pain or tenderness and normal flexion and extension. Neurologic:  Normal speech and language. No gross focal neurologic deficits are appreciated.  Skin:  Skin is warm, dry and intact. No rash noted.   ____________________________________________   LABS (all labs ordered are listed, but only abnormal results are displayed)  Labs Reviewed - No data to display ____________________________________________  EKG  none ____________________________________________  RADIOLOGY   Dg Hand Complete Right  10/04/2015  CLINICAL DATA:  30 year old female with hand pain and swelling after punching a wall EXAM: RIGHT HAND - COMPLETE 3+ VIEW  COMPARISON:  None. FINDINGS: There is no evidence of fracture or dislocation. There is no evidence of arthropathy or other focal bone abnormality. Soft tissues are unremarkable. IMPRESSION: Negative. Electronically Signed   By: Malachy Moan M.D.   On: 10/04/2015 21:40    ____________________________________________   PROCEDURES  Procedure(s) performed: None  Critical Care performed: No ____________________________________________   INITIAL IMPRESSION / ASSESSMENT AND PLAN / ED  COURSE  Pertinent labs & imaging results that were available during my care of the patient were reviewed by me and considered in my medical decision making (see chart for details).  No evidence of fracture or dislocation.  Patient has a contusion after striking a concrete wall.  I gave her a removable felt grow wrist splint for comfort and I gave her my usual and customary management recommendations and return precautions.  I provided her with orthopedics contact information for outpatient follow-up in a week if she is not feeling better.  ____________________________________________  FINAL CLINICAL IMPRESSION(S) / ED DIAGNOSES  Final diagnoses:  Hand contusion, right, initial encounter      NEW MEDICATIONS STARTED DURING THIS VISIT:  Discharge Medication List as of 10/04/2015 10:49 PM    START taking these medications   Details  docusate sodium (COLACE) 100 MG capsule Take 1 tablet once or twice daily as needed for constipation while taking narcotic pain medicine, Print    HYDROcodone-acetaminophen (NORCO/VICODIN) 5-325 MG tablet Take 1-2 tablets by mouth every 4 (four) hours as needed for moderate pain., Starting 10/04/2015, Until Discontinued, Print         Loleta Rose, MD 10/04/15 [phone]

## 2015-10-04 NOTE — Discharge Instructions (Signed)
Hand Contusion °A hand contusion is a deep bruise on your hand area. Contusions are the result of an injury that caused bleeding under the skin. The contusion may turn blue, purple, or yellow. Minor injuries will give you a painless contusion, but more severe contusions may stay painful and swollen for a few weeks. °CAUSES  °A contusion is usually caused by a blow, trauma, or direct force to an area of the body. °SYMPTOMS  °· Swelling and redness of the injured area. °· Discoloration of the injured area. °· Tenderness and soreness of the injured area. °· Pain. °DIAGNOSIS  °The diagnosis can be made by taking a history and performing a physical exam. An X-ray, CT scan, or MRI may be needed to determine if there were any associated injuries, such as broken bones (fractures). °TREATMENT  °Often, the best treatment for a hand contusion is resting, elevating, icing, and applying cold compresses to the injured area. Over-the-counter medicines may also be recommended for pain control. °HOME CARE INSTRUCTIONS  °· Put ice on the injured area. °· Put ice in a plastic bag. °· Place a towel between your skin and the bag. °· Leave the ice on for 15-20 minutes, 03-04 times a day. °· Only take over-the-counter or prescription medicines as directed by your caregiver. Your caregiver may recommend avoiding anti-inflammatory medicines (aspirin, ibuprofen, and naproxen) for 48 hours because these medicines may increase bruising. °· If told, use an elastic wrap as directed. This can help reduce swelling. You may remove the wrap for sleeping, showering, and bathing. If your fingers become numb, cold, or blue, take the wrap off and reapply it more loosely. °· Elevate your hand with pillows to reduce swelling. °· Avoid overusing your hand if it is painful. °SEEK IMMEDIATE MEDICAL CARE IF:  °· You have increased redness, swelling, or pain in your hand. °· Your swelling or pain is not relieved with medicines. °· You have loss of feeling in  your hand or are unable to move your fingers. °· Your hand turns cold or blue. °· You have pain when you move your fingers. °· Your hand becomes warm to the touch. °· Your contusion does not improve in 2 days. °MAKE SURE YOU:  °· Understand these instructions. °· Will watch your condition. °· Will get help right away if you are not doing well or get worse. °  °This information is not intended to replace advice given to you by your health care provider. Make sure you discuss any questions you have with your health care provider. °  °Document Released: 01/25/2002 Document Revised: 04/29/2012 Document Reviewed: 01/27/2012 °Elsevier Interactive Patient Education ©2016 [employer] ° °Cryotherapy °Cryotherapy means treatment with cold. Ice or gel packs can be used to reduce both pain and swelling. Ice is the most helpful within the first 24 to 48 hours after an injury or flare-up from overusing a muscle or joint. Sprains, strains, spasms, burning pain, shooting pain, and aches can all be eased with ice. Ice can also be used when recovering from surgery. Ice is effective, has very few side effects, and is safe for most people to use. °PRECAUTIONS  °Ice is not a safe treatment option for people with: °· Raynaud phenomenon. This is a condition affecting small blood vessels in the extremities. Exposure to cold may cause your problems to return. °· Cold hypersensitivity. There are many forms of cold hypersensitivity, including: °¨ Cold urticaria. Red, itchy hives appear on the skin when the tissues begin to warm   after being iced. °¨ Cold erythema. This is a red, itchy rash caused by exposure to cold. °¨ Cold hemoglobinuria. Red blood cells break down when the tissues begin to warm after being iced. The hemoglobin that carry oxygen are passed into the urine because they cannot combine with blood proteins fast enough. °· Numbness or altered sensitivity in the area being iced. °If you have any of the following conditions, do  not use ice until you have discussed cryotherapy with your caregiver: °· Heart conditions, such as arrhythmia, angina, or chronic heart disease. °· High blood pressure. °· Healing wounds or open skin in the area being iced. °· Current infections. °· Rheumatoid arthritis. °· Poor circulation. °· Diabetes. °Ice slows the blood flow in the region it is applied. This is beneficial when trying to stop inflamed tissues from spreading irritating chemicals to surrounding tissues. However, if you expose your skin to cold temperatures for too long or without the proper protection, you can damage your skin or nerves. Watch for signs of skin damage due to cold. °HOME CARE INSTRUCTIONS °Follow these tips to use ice and cold packs safely. °· Place a dry or damp towel between the ice and skin. A damp towel will cool the skin more quickly, so you may need to shorten the time that the ice is used. °· For a more rapid response, add gentle compression to the ice. °· Ice for no more than 10 to 20 minutes at a time. The bonier the area you are icing, the less time it will take to get the benefits of ice. °· Check your skin after 5 minutes to make sure there are no signs of a poor response to cold or skin damage. °· Rest 20 minutes or more between uses. °· Once your skin is numb, you can end your treatment. You can test numbness by very lightly touching your skin. The touch should be so light that you do not see the skin dimple from the pressure of your fingertip. When using ice, most people will feel these normal sensations in this order: cold, burning, aching, and numbness. °· Do not use ice on someone who cannot communicate their responses to pain, such as small children or people with dementia. °HOW TO MAKE AN ICE PACK °Ice packs are the most common way to use ice therapy. Other methods include ice massage, ice baths, and cryosprays. Muscle creams that cause a cold, tingly feeling do not offer the same benefits that ice offers and  should not be used as a substitute unless recommended by your caregiver. °To make an ice pack, do one of the following: °· Place crushed ice or a bag of frozen vegetables in a sealable plastic bag. Squeeze out the excess air. Place this bag inside another plastic bag. Slide the bag into a pillowcase or place a damp towel between your skin and the bag. °· Mix 3 parts water with 1 part rubbing alcohol. Freeze the mixture in a sealable plastic bag. When you remove the mixture from the freezer, it will be slushy. Squeeze out the excess air. Place this bag inside another plastic bag. Slide the bag into a pillowcase or place a damp towel between your skin and the bag. °SEEK MEDICAL CARE IF: °· You develop white spots on your skin. This may give the skin a blotchy (mottled) appearance. °· Your skin turns blue or pale. °· Your skin becomes waxy or hard. °· Your swelling gets worse. °MAKE SURE YOU:  °· Understand   these instructions. °· Will watch your condition. °· Will get help right away if you are not doing well or get worse. °  °This information is not intended to replace advice given to you by your health care provider. Make sure you discuss any questions you have with your health care provider. °  °Document Released: 04/01/2011 Document Revised: 08/26/2014 Document Reviewed: 04/01/2011 °Elsevier Interactive Patient Education ©2016 [employer] Inc[employer] ° °

## 2015-10-29 ENCOUNTER — Emergency Department
Admission: EM | Admit: 2015-10-29 | Discharge: 2015-10-29 | Disposition: A | Discharge status: Home / Self Care | Payer: Medicaid Other | Attending: Emergency Medicine | Admitting: Emergency Medicine

## 2015-10-29 ENCOUNTER — Encounter: Payer: Self-pay | Admitting: Emergency Medicine

## 2015-10-29 DIAGNOSIS — F419 Anxiety disorder, unspecified: Secondary | ICD-10-CM | POA: Insufficient documentation

## 2015-10-29 DIAGNOSIS — Z880 Allergy status to penicillin: Secondary | ICD-10-CM | POA: Insufficient documentation

## 2015-10-29 DIAGNOSIS — Z79899 Other long term (current) drug therapy: Secondary | ICD-10-CM | POA: Insufficient documentation

## 2015-10-29 DIAGNOSIS — Z91040 Latex allergy status: Secondary | ICD-10-CM | POA: Insufficient documentation

## 2015-10-29 DIAGNOSIS — K2970 Gastritis, unspecified, without bleeding: Secondary | ICD-10-CM | POA: Insufficient documentation

## 2015-10-29 DIAGNOSIS — Z792 Long term (current) use of antibiotics: Secondary | ICD-10-CM | POA: Insufficient documentation

## 2015-10-29 DIAGNOSIS — F17210 Nicotine dependence, cigarettes, uncomplicated: Secondary | ICD-10-CM | POA: Insufficient documentation

## 2015-10-29 DIAGNOSIS — Z3202 Encounter for pregnancy test, result negative: Secondary | ICD-10-CM | POA: Insufficient documentation

## 2015-10-29 LAB — COMPREHENSIVE METABOLIC PANEL
ALT: 14 U/L (ref 14–54)
ANION GAP: 8 (ref 5–15)
AST: 19 U/L (ref 15–41)
Albumin: 4.2 g/dL (ref 3.5–5.0)
Alkaline Phosphatase: 41 U/L (ref 38–126)
BILIRUBIN TOTAL: 0.5 mg/dL (ref 0.3–1.2)
BUN: 12 mg/dL (ref 6–20)
CHLORIDE: 105 mmol/L (ref 101–111)
CO2: 23 mmol/L (ref 22–32)
Calcium: 9.1 mg/dL (ref 8.9–10.3)
Creatinine, Ser: 0.75 mg/dL (ref 0.44–1.00)
Glucose, Bld: 107 mg/dL — ABNORMAL HIGH (ref 65–99)
POTASSIUM: 3.7 mmol/L (ref 3.5–5.1)
Sodium: 136 mmol/L (ref 135–145)
TOTAL PROTEIN: 7.6 g/dL (ref 6.5–8.1)

## 2015-10-29 LAB — URINALYSIS COMPLETE WITH MICROSCOPIC (ARMC ONLY)
BILIRUBIN URINE: NEGATIVE
GLUCOSE, UA: NEGATIVE mg/dL
HGB URINE DIPSTICK: NEGATIVE
KETONES UR: NEGATIVE mg/dL
LEUKOCYTES UA: NEGATIVE
NITRITE: NEGATIVE
PH: 7 (ref 5.0–8.0)
Protein, ur: 30 mg/dL — AB
Specific Gravity, Urine: 1.031 — ABNORMAL HIGH (ref 1.005–1.030)

## 2015-10-29 LAB — LIPASE, BLOOD: Lipase: 15 U/L (ref 11–51)

## 2015-10-29 LAB — CBC
HCT: 39.9 % (ref 35.0–47.0)
Hemoglobin: 14 g/dL (ref 12.0–16.0)
MCH: 34.8 pg — AB (ref 26.0–34.0)
MCHC: 35.1 g/dL (ref 32.0–36.0)
MCV: 99.3 fL (ref 80.0–100.0)
PLATELETS: 241 10*3/uL (ref 150–440)
RBC: 4.02 MIL/uL (ref 3.80–5.20)
RDW: 13.1 % (ref 11.5–14.5)
WBC: 8.8 10*3/uL (ref 3.6–11.0)

## 2015-10-29 LAB — POCT PREGNANCY, URINE: Preg Test, Ur: NEGATIVE

## 2015-10-29 MED ORDER — GI COCKTAIL ~~LOC~~
30.0000 mL | Freq: Once | ORAL | Status: AC
  Administered 2015-10-29: 30 mL via ORAL
  Filled 2015-10-29: qty 30

## 2015-10-29 MED ORDER — SODIUM CHLORIDE 0.9 % IV BOLUS (SEPSIS)
1000.0000 mL | Freq: Once | INTRAVENOUS | Status: AC
  Administered 2015-10-29: 1000 mL via INTRAVENOUS

## 2015-10-29 MED ORDER — ONDANSETRON HCL 4 MG PO TABS: 4.0000 mg | ORAL_TABLET | Freq: Every day | ORAL | Status: DC | PRN

## 2015-10-29 MED ORDER — ONDANSETRON HCL 4 MG/2ML IJ SOLN
4.0000 mg | Freq: Once | INTRAMUSCULAR | Status: AC
  Administered 2015-10-29: 4 mg via INTRAVENOUS
  Filled 2015-10-29: qty 2

## 2015-10-29 NOTE — ED Notes (Signed)
Pt verbalized understanding of discharge instructions. NAD at this time. 

## 2015-10-29 NOTE — Discharge Instructions (Signed)

## 2015-10-29 NOTE — ED Notes (Signed)
Patient presents to the ED with abdominal pain and vomiting x 3 since midnight.  Patient reports taking nyquil around midnight for difficulty sleeping, slight cough and congestion.  Patient states, "the nyquil didn't agree with me."  Patient is complaining of diffuse upper abdominal pain.

## 2015-10-29 NOTE — ED Provider Notes (Signed)
[facility] Emergency Department Provider Note  ____________________________________________    I have reviewed the triage vital signs and the nursing notes.   HISTORY  Chief Complaint Abdominal Pain and Emesis    HPI Jenny Baldwin is a 30 y.o. female who presents with complaints of nausea and vomiting since approximately midnight. Patient reports that she has had an upper respiratory infection and took some NyQuil approximately midnight. She reports one hour later she developed nausea and vomiting. She complains of generalized stomach cramping. She has tolerated NyQuil the past without difficulty. She denies fevers or chills. She denies diarrhea. No sick contacts     Past Medical History  Diagnosis Date  . Acid reflux   . Recurrent UTI     with pregnancy  . SVD (spontaneous vaginal delivery)     x 5  . Bipolar 1 disorder (HCC)   . PTSD (post-traumatic stress disorder)   . Chronic right hip pain   . Prediabetes   . Asthma     Patient Active Problem List   Diagnosis Date Noted  . Major depressive disorder, recurrent, severe without psychotic features (HCC)   . PTSD (post-traumatic stress disorder) 04/22/2013  . Cesarean delivery, without mention of indication, delivered, with or without mention of antepartum condition 05/11/2012    Past Surgical History  Procedure Laterality Date  . No past surgeries    . Cesarean section  05/08/2012    Procedure: CESAREAN SECTION;  Surgeon: Brock Bad, MD;  Location: [facility];  Service: Obstetrics;  Laterality: N/A;  Primary Cesarean Section Delivery Girl @ 1555, Apgars 9/9    Current Outpatient Rx  Name  Route  Sig  Dispense  Refill  . cephALEXin (KEFLEX) 500 MG capsule   Oral   Take 1 capsule (500 mg total) by mouth 3 (three) times daily.   36 capsule   0   . docusate sodium (COLACE) 100 MG capsule      Take 1 tablet once or twice daily as needed for constipation while taking narcotic pain  medicine   30 capsule   0   . FLUoxetine (PROZAC) 20 MG capsule   Oral   Take 1 capsule (20 mg total) by mouth daily.   30 capsule   0   . HYDROcodone-acetaminophen (NORCO/VICODIN) 5-325 MG tablet   Oral   Take 1-2 tablets by mouth every 4 (four) hours as needed for moderate pain.   15 tablet   0   . ondansetron (ZOFRAN) 4 MG tablet      Take 1-2 tabs by mouth every 8 hours as needed for nausea/vomiting   30 tablet   0   . potassium chloride SA (KLOR-CON M20) 20 MEQ tablet   Oral   Take 1 tablet (20 mEq total) by mouth daily.   7 tablet   0   . traZODone (DESYREL) 100 MG tablet   Oral   Take 1 tablet (100 mg total) by mouth at bedtime.   30 tablet   0   . valACYclovir (VALTREX) 1000 MG tablet      take 1 tablet by mouth once daily   30 tablet   10     Allergies Latex; Penicillins; Pineapple; and Vicodin  Family History  Problem Relation Age of Onset  . Anesthesia problems Neg Hx   . Diabetes Mother   . Diabetes Maternal Aunt   . Diabetes Maternal Uncle     Social History Social History  Substance Use Topics  .  Smoking status: Current Every Day Smoker -- 1.00 packs/day for 14 years    Types: Cigarettes    Last Attempt to Quit: 11/08/2011  . Smokeless tobacco: Never Used  . Alcohol Use: 1.2 oz/week    2 Glasses of wine per week    Review of Systems  Constitutional: Negative for fever. Eyes: Negative for Discharge ENT: Congestion Cardiovascular: Negative for chest pain. Respiratory: No cough Gastrointestinal: As above Genitourinary: Negative for dysuria. Musculoskeletal: Negative for back pain. Skin: Negative for rash. Neurological: Negative for headaches  Psychiatric:Positive anxiety    ____________________________________________   PHYSICAL EXAM:  VITAL SIGNS: ED Triage Vitals  Enc Vitals Group     BP 10/29/15 0812 131/82 mmHg     Pulse Rate 10/29/15 0812 75     Resp 10/29/15 0812 20     Temp 10/29/15 0812 98.6 F (37 C)      Temp Source 10/29/15 0812 Oral     SpO2 10/29/15 0812 97 %     Weight 10/29/15 0812 175 lb (79.379 kg)     Height 10/29/15 0812 5\' 2"  (1.575 m)     Head Cir --      Peak Flow --      Pain Score 10/29/15 0812 10     Pain Loc --      Pain Edu? --      Excl. in GC? --      Constitutional: Alert and oriented. Well appearing and in no distress. Eyes: Conjunctivae are normal.  ENT   Head: Normocephalic and atraumatic.   Mouth/Throat: Mucous membranes are moist. Cardiovascular: Normal rate, regular rhythm. Normal and symmetric distal pulses are present in all extremities. Respiratory: Normal respiratory effort without tachypnea nor retractions.  Gastrointestinal: Soft and non-tender in all quadrants. No distention. There is no CVA tenderness. Genitourinary: deferred Musculoskeletal: Nontender with normal range of motion in all extremities. No lower extremity tenderness nor edema. Neurologic:  Normal speech and language. No gross focal neurologic deficits are appreciated. Skin:  Skin is warm, dry and intact. No rash noted. Psychiatric: Mood and affect are normal. Patient exhibits appropriate insight and judgment.  ____________________________________________    LABS (pertinent positives/negatives)  Labs Reviewed  CBC  COMPREHENSIVE METABOLIC PANEL  LIPASE, BLOOD  URINALYSIS COMPLETEWITH MICROSCOPIC ([facility] ONLY)    ____________________________________________   EKG  None  ____________________________________________    RADIOLOGY I have personally reviewed any xrays that were ordered on this patient: None  ____________________________________________   PROCEDURES  Procedure(s) performed: none  Critical Care performed: none  ____________________________________________   INITIAL IMPRESSION / ASSESSMENT AND PLAN / ED COURSE  Pertinent labs & imaging results that were available during my care of the patient were reviewed by me and considered in my medical  decision making (see chart for details).  Patient presents with nausea and vomiting. Her abdominal exam is benign. We will check labs, urine give IV Zofran and reevaluate. Overall patient is well-appearing and suspect symptomatically relief will be all that is required  Patient feels significant better after GI cocktail and Zofran 2. Her laboratory results unremarkable. Her abdomen remains soft and nontender. Recommend supportive care. Return precautions discussed ____________________________________________   FINAL CLINICAL IMPRESSION(S) / ED DIAGNOSES  Final diagnoses:  Gastritis     Jene Everyobert Everson Mott, MD 10/29/15 1253

## 2015-11-05 ENCOUNTER — Encounter: Payer: Self-pay | Admitting: Emergency Medicine

## 2015-11-05 ENCOUNTER — Emergency Department: Payer: Self-pay

## 2015-11-05 ENCOUNTER — Emergency Department
Admission: EM | Admit: 2015-11-05 | Discharge: 2015-11-05 | Disposition: A | Discharge status: Home / Self Care | Payer: Self-pay | Attending: Emergency Medicine | Admitting: Emergency Medicine

## 2015-11-05 DIAGNOSIS — G8929 Other chronic pain: Secondary | ICD-10-CM | POA: Insufficient documentation

## 2015-11-05 DIAGNOSIS — M25551 Pain in right hip: Secondary | ICD-10-CM | POA: Insufficient documentation

## 2015-11-05 DIAGNOSIS — J45909 Unspecified asthma, uncomplicated: Secondary | ICD-10-CM | POA: Insufficient documentation

## 2015-11-05 DIAGNOSIS — S6991XS Unspecified injury of right wrist, hand and finger(s), sequela: Secondary | ICD-10-CM | POA: Insufficient documentation

## 2015-11-05 DIAGNOSIS — N390 Urinary tract infection, site not specified: Secondary | ICD-10-CM | POA: Insufficient documentation

## 2015-11-05 DIAGNOSIS — F17210 Nicotine dependence, cigarettes, uncomplicated: Secondary | ICD-10-CM | POA: Insufficient documentation

## 2015-11-05 DIAGNOSIS — S6291XP Unspecified fracture of right wrist and hand, subsequent encounter for fracture with malunion: Secondary | ICD-10-CM

## 2015-11-05 DIAGNOSIS — S62316P Displaced fracture of base of fifth metacarpal bone, right hand, subsequent encounter for fracture with malunion: Secondary | ICD-10-CM | POA: Insufficient documentation

## 2015-11-05 DIAGNOSIS — X838XXD Intentional self-harm by other specified means, subsequent encounter: Secondary | ICD-10-CM | POA: Insufficient documentation

## 2015-11-05 DIAGNOSIS — Z792 Long term (current) use of antibiotics: Secondary | ICD-10-CM | POA: Insufficient documentation

## 2015-11-05 DIAGNOSIS — S6291XS Unspecified fracture of right wrist and hand, sequela: Secondary | ICD-10-CM

## 2015-11-05 DIAGNOSIS — F332 Major depressive disorder, recurrent severe without psychotic features: Secondary | ICD-10-CM | POA: Insufficient documentation

## 2015-11-05 DIAGNOSIS — Z79899 Other long term (current) drug therapy: Secondary | ICD-10-CM | POA: Insufficient documentation

## 2015-11-05 MED ORDER — TRAMADOL HCL 50 MG PO TABS: 50.0000 mg | ORAL_TABLET | Freq: Two times a day (BID) | ORAL | Status: DC

## 2015-11-05 NOTE — Discharge Instructions (Signed)
Wear the splint until evaluated by ortho. Take ibuprofen or naproxen for pain and inflammation. Take the pain medicine as needed for moderate pain. Call Dr. Joice LoftsPoggi for further fracture management.

## 2015-11-05 NOTE — ED Provider Notes (Signed)
[facility] Emergency Department Provider Note ____________________________________________  Time seen: 2051  I have reviewed the triage vital signs and the nursing notes.  HISTORY  Chief Complaint  Hand Pain  HPI Jenny Baldwin is a 30 y.o. female visits to the EDfor evaluation of continued pain and deformity to the dorsal right hand. She was evaluated here about 4 weeks prior on the date of injury, for a self-inflicted injury to the right hand. She describes punched a concrete wall at that time. She was evaluated by x-ray, and the disposition was hand contusion without evidence of fracture or dislocation. Patient was splinted as appropriate and given follow-up instructions for orthopedics for 1 week. She reports here that she remained in the ulnar gutter splint for 2 weeks before finally removing the splint. She did not follow up with orthopedics as suggested. She denies any reinjury, fall, contusion, or punching injury since the initial evaluation. She reports decreased grip strength to the pinky finger, as well as tenderness to palpation to the dorsolateral hand. She rates her overall discomfort to the hand at a 7/10 in triage.  Past Medical History  Diagnosis Date  . Acid reflux   . Recurrent UTI     with pregnancy  . SVD (spontaneous vaginal delivery)     x 5  . Bipolar 1 disorder (HCC)   . PTSD (post-traumatic stress disorder)   . Chronic right hip pain   . Prediabetes   . Asthma     Patient Active Problem List   Diagnosis Date Noted  . Major depressive disorder, recurrent, severe without psychotic features (HCC)   . PTSD (post-traumatic stress disorder) 04/22/2013  . Cesarean delivery, without mention of indication, delivered, with or without mention of antepartum condition 05/11/2012    Past Surgical History  Procedure Laterality Date  . No past surgeries    . Cesarean section  05/08/2012    Procedure: CESAREAN SECTION;  Surgeon: Brock Bad,  MD;  Location: [facility] ORS;  Service: Obstetrics;  Laterality: N/A;  Primary Cesarean Section Delivery Girl @ 1555, Apgars 9/9    Current Outpatient Rx  Name  Route  Sig  Dispense  Refill  . cephALEXin (KEFLEX) 500 MG capsule   Oral   Take 1 capsule (500 mg total) by mouth 3 (three) times daily.   36 capsule   0   . docusate sodium (COLACE) 100 MG capsule      Take 1 tablet once or twice daily as needed for constipation while taking narcotic pain medicine   30 capsule   0   . FLUoxetine (PROZAC) 20 MG capsule   Oral   Take 1 capsule (20 mg total) by mouth daily.   30 capsule   0   . HYDROcodone-acetaminophen (NORCO/VICODIN) 5-325 MG tablet   Oral   Take 1-2 tablets by mouth every 4 (four) hours as needed for moderate pain.   15 tablet   0   . ondansetron (ZOFRAN) 4 MG tablet   Oral   Take 1 tablet (4 mg total) by mouth daily as needed for nausea or vomiting.   20 tablet   1   . potassium chloride SA (KLOR-CON M20) 20 MEQ tablet   Oral   Take 1 tablet (20 mEq total) by mouth daily.   7 tablet   0   . traMADol (ULTRAM) 50 MG tablet   Oral   Take 1 tablet (50 mg total) by mouth 2 (two) times daily.  10 tablet   0   . traZODone (DESYREL) 100 MG tablet   Oral   Take 1 tablet (100 mg total) by mouth at bedtime.   30 tablet   0   . valACYclovir (VALTREX) 1000 MG tablet      take 1 tablet by mouth once daily   30 tablet   10     Allergies Latex; Penicillins; Pineapple; and Vicodin  Family History  Problem Relation Age of Onset  . Anesthesia problems Neg Hx   . Diabetes Mother   . Diabetes Maternal Aunt   . Diabetes Maternal Uncle     Social History Social History  Substance Use Topics  . Smoking status: Current Every Day Smoker -- 1.00 packs/day for 14 years    Types: Cigarettes    Last Attempt to Quit: 11/08/2011  . Smokeless tobacco: Never Used  . Alcohol Use: 1.2 oz/week    2 Glasses of wine per week    Review of Systems  Constitutional:  Negative for fever. Cardiovascular: Negative for chest pain. Respiratory: Negative for shortness of breath. Gastrointestinal: Negative for abdominal pain, vomiting and diarrhea. Genitourinary: Negative for dysuria. Musculoskeletal: Negative for back pain.Right hand pain and deformity as above. Skin: Negative for rash. Neurological: Negative for headaches, focal weakness or numbness. ____________________________________________  PHYSICAL EXAM:  VITAL SIGNS: ED Triage Vitals  Enc Vitals Group     BP 11/05/15 2104 137/95 mmHg     Pulse Rate 11/05/15 2104 81     Resp 11/05/15 2104 16     Temp 11/05/15 2104 98.4 F (36.9 C)     Temp Source 11/05/15 2104 Oral     SpO2 11/05/15 2104 100 %     Weight --      Height --      Head Cir --      Peak Flow --      Pain Score 11/05/15 2035 7     Pain Loc --      Pain Edu? --      Excl. in GC? --     Constitutional: Alert and oriented. Well appearing and in no distress. Head: Normocephalic and atraumatic. Hematological/Lymphatic/Immunological: No cervical lymphadenopathy. Cardiovascular: Normal rate, regular rhythm. Normal distal pulses normal capillary refill. Respiratory: Normal respiratory effort. No wheezes/rales/rhonchi. Gastrointestinal: Soft and nontender. No distention. Musculoskeletal: Right hand with subtle dorsal deformity at the base of the fifth metacarpal. No overlying swelling, edema, bruise, abrasion, or lacerations appreciated. Normal composite fist on the right. Nontender with normal range of motion in all extremities.  Neurologic: Cranial nerves II through XII grossly intact. Normal gross sensation. Normal intrinsic and opposition testing. Normal gait without ataxia. Normal speech and language. No gross focal neurologic deficits are appreciated. Skin:  Skin is warm, dry and intact. No rash noted. Psychiatric: Mood and affect are normal. Patient exhibits appropriate insight and  judgment. ____________________________________________   RADIOLOGY IMPRESSION: Comminuted fractures of the base of the right fifth metacarpal bone extending to the carpometacarpal joint surface. ____________________________________________  PROCEDURES  Ulnar gutter splint ____________________________________________  INITIAL IMPRESSION / ASSESSMENT AND PLAN / ED COURSE  Patient with radiologic evidence of comminuted and impacted appearing fractures at the base of the fifth metacarpal. She'll be discharged with a normal gutter splint and referred to orthopedics for further evaluation management. Patient is advised to call or so tomorrow as she failed to follow up last month as directed.She will dose ibuprofen or Naprosyn for pain and inflammation, and is provided with the exception  for #10 Ultram positive for moderate pain. ____________________________________________  FINAL CLINICAL IMPRESSION(S) / ED DIAGNOSES  Final diagnoses:  Hand fracture, right, sequela  Right hand fracture, with malunion, subsequent encounter      Lissa HoardJenise V Bacon Melitta Tigue, PA-C 11/05/15 2200  Sharyn CreamerMark Quale, MD 11/06/15 [phone]

## 2015-11-05 NOTE — ED Notes (Signed)
Patient states she was treated here 3 weeks arm pain r/t hitting a wall and was given a splint for treatment.  She states she wore the splint "most of the time".  Today she presents with right hand pain and on palpation feels like one of her metacarpals are enlarged or deformed.  She is reporting 7/10 pain consistently.

## 2015-11-30 ENCOUNTER — Emergency Department
Admission: EM | Admit: 2015-11-30 | Discharge: 2015-11-30 | Disposition: A | Discharge status: Home / Self Care | Payer: Self-pay | Attending: Emergency Medicine | Admitting: Emergency Medicine

## 2015-11-30 ENCOUNTER — Encounter: Payer: Self-pay | Admitting: Emergency Medicine

## 2015-11-30 ENCOUNTER — Emergency Department: Payer: Self-pay

## 2015-11-30 DIAGNOSIS — Z91040 Latex allergy status: Secondary | ICD-10-CM | POA: Insufficient documentation

## 2015-11-30 DIAGNOSIS — X509XXA Other and unspecified overexertion or strenuous movements or postures, initial encounter: Secondary | ICD-10-CM | POA: Insufficient documentation

## 2015-11-30 DIAGNOSIS — S8991XA Unspecified injury of right lower leg, initial encounter: Secondary | ICD-10-CM | POA: Insufficient documentation

## 2015-11-30 DIAGNOSIS — Y9372 Activity, wrestling: Secondary | ICD-10-CM | POA: Insufficient documentation

## 2015-11-30 DIAGNOSIS — F17210 Nicotine dependence, cigarettes, uncomplicated: Secondary | ICD-10-CM | POA: Insufficient documentation

## 2015-11-30 DIAGNOSIS — M25561 Pain in right knee: Secondary | ICD-10-CM

## 2015-11-30 DIAGNOSIS — W1839XA Other fall on same level, initial encounter: Secondary | ICD-10-CM | POA: Insufficient documentation

## 2015-11-30 DIAGNOSIS — Y999 Unspecified external cause status: Secondary | ICD-10-CM | POA: Insufficient documentation

## 2015-11-30 DIAGNOSIS — Y929 Unspecified place or not applicable: Secondary | ICD-10-CM | POA: Insufficient documentation

## 2015-11-30 DIAGNOSIS — J45909 Unspecified asthma, uncomplicated: Secondary | ICD-10-CM | POA: Insufficient documentation

## 2015-11-30 DIAGNOSIS — F319 Bipolar disorder, unspecified: Secondary | ICD-10-CM | POA: Insufficient documentation

## 2015-11-30 MED ORDER — NAPROXEN 500 MG PO TABS: 500.0000 mg | ORAL_TABLET | Freq: Two times a day (BID) | ORAL | Status: DC

## 2015-11-30 NOTE — Discharge Instructions (Signed)

## 2015-11-30 NOTE — ED Notes (Signed)
Pt presents to ED with c/o right knee injury, states she was playing with her son and when turn she heard he knee cracked. States unable to put pressure on knee. Pulse and sensation presents at distal extremity.

## 2015-11-30 NOTE — ED Notes (Signed)
Pt assisted out of vehicle into w/c; reports was "playing around" and heard a crack in her knee; c/o severe right knee pain since; pt taken to room 42 for further eval

## 2015-11-30 NOTE — ED Provider Notes (Signed)
[facility] Emergency Department Provider Note  ____________________________________________  Time seen: Approximately 9:37 PM  I have reviewed the triage vital signs and the nursing notes.   HISTORY  Chief Complaint Knee Injury    HPI Jenny Baldwin is a 30 y.o. female who presents to emergency room complaining right knee pain. Patient states that she was playing with her son when she twisted, heard a popping sensation, and fell landing on her knee. Patient states that the pain is to the anterior aspect of the knee. She denies any swelling. She endorses pain that is constant, severe, sharp and burning in nature. Patient denies any numbness or tingling in her distal extremity. No other injury or complaint at this time. Patient has not take any medications prior to arrival.   Past Medical History  Diagnosis Date  . Acid reflux   . Recurrent UTI     with pregnancy  . SVD (spontaneous vaginal delivery)     x 5  . Bipolar 1 disorder (HCC)   . PTSD (post-traumatic stress disorder)   . Chronic right hip pain   . Prediabetes   . Asthma     Patient Active Problem List   Diagnosis Date Noted  . Major depressive disorder, recurrent, severe without psychotic features (HCC)   . PTSD (post-traumatic stress disorder) 04/22/2013  . Cesarean delivery, without mention of indication, delivered, with or without mention of antepartum condition 05/11/2012    Past Surgical History  Procedure Laterality Date  . No past surgeries    . Cesarean section  05/08/2012    Procedure: CESAREAN SECTION;  Surgeon: Brock Bad, MD;  Location: [facility];  Service: Obstetrics;  Laterality: N/A;  Primary Cesarean Section Delivery Girl @ 1555, Apgars 9/9    Current Outpatient Rx  Name  Route  Sig  Dispense  Refill  . cephALEXin (KEFLEX) 500 MG capsule   Oral   Take 1 capsule (500 mg total) by mouth 3 (three) times daily.   36 capsule   0   . docusate sodium (COLACE) 100 MG  capsule      Take 1 tablet once or twice daily as needed for constipation while taking narcotic pain medicine   30 capsule   0   . FLUoxetine (PROZAC) 20 MG capsule   Oral   Take 1 capsule (20 mg total) by mouth daily.   30 capsule   0   . HYDROcodone-acetaminophen (NORCO/VICODIN) 5-325 MG tablet   Oral   Take 1-2 tablets by mouth every 4 (four) hours as needed for moderate pain.   15 tablet   0   . naproxen (NAPROSYN) 500 MG tablet   Oral   Take 1 tablet (500 mg total) by mouth 2 (two) times daily with a meal.   60 tablet   0   . ondansetron (ZOFRAN) 4 MG tablet   Oral   Take 1 tablet (4 mg total) by mouth daily as needed for nausea or vomiting.   20 tablet   1   . potassium chloride SA (KLOR-CON M20) 20 MEQ tablet   Oral   Take 1 tablet (20 mEq total) by mouth daily.   7 tablet   0   . traMADol (ULTRAM) 50 MG tablet   Oral   Take 1 tablet (50 mg total) by mouth 2 (two) times daily.   10 tablet   0   . traZODone (DESYREL) 100 MG tablet   Oral   Take 1 tablet (  100 mg total) by mouth at bedtime.   30 tablet   0   . valACYclovir (VALTREX) 1000 MG tablet      take 1 tablet by mouth once daily   30 tablet   10     Allergies Latex; Penicillins; Pineapple; and Vicodin  Family History  Problem Relation Age of Onset  . Anesthesia problems Neg Hx   . Diabetes Mother   . Diabetes Maternal Aunt   . Diabetes Maternal Uncle     Social History Social History  Substance Use Topics  . Smoking status: Current Every Day Smoker -- 1.00 packs/day for 14 years    Types: Cigarettes    Last Attempt to Quit: 11/08/2011  . Smokeless tobacco: Never Used  . Alcohol Use: 1.2 oz/week    2 Glasses of wine per week     Review of Systems  Constitutional: No fever/chills Cardiovascular: no chest pain. Respiratory: no cough. No SOB. Musculoskeletal: Positive for right knee pain. Skin: Negative for rash. Neurological: Negative for headaches, focal weakness or  numbness. 10-point ROS otherwise negative.  ____________________________________________   PHYSICAL EXAM:  VITAL SIGNS: ED Triage Vitals  Enc Vitals Group     BP --      Pulse --      Resp --      Temp --      Temp src --      SpO2 --      Weight --      Height --      Head Cir --      Peak Flow --      Pain Score 11/30/15 2054 10     Pain Loc --      Pain Edu? --      Excl. in GC? --      Constitutional: Alert and oriented. Well appearing and in no acute distress. Eyes: Conjunctivae are normal. PERRL. EOMI. Head: Atraumatic. Cardiovascular: Normal rate, regular rhythm. Normal S1 and S2.  Good peripheral circulation. Respiratory: Normal respiratory effort without tachypnea or retractions. Lungs CTAB. Musculoskeletal: No visible deformity or edema to the right knee when compared to left. Patient has limited range of motion due to pain. All range of motion with passive movement. Patient is diffusely tender to palpation with no point tenderness. No palpable abnormality. Varus, valgus, Lachman's are negative. McMurray's is negative. Dorsalis pedis pulses appreciated distally. Sensation intact and equal to unaffected extremity. Neurologic:  Normal speech and language. No gross focal neurologic deficits are appreciated.  Skin:  Skin is warm, dry and intact. No rash noted. Psychiatric: Mood and affect are normal. Speech and behavior are normal. Patient exhibits appropriate insight and judgement.   ____________________________________________   LABS (all labs ordered are listed, but only abnormal results are displayed)  Labs Reviewed - No data to display ____________________________________________  EKG   ____________________________________________  RADIOLOGY Festus BarrenI, Jonathan D Cuthriell, personally viewed and evaluated these images (plain radiographs) as part of my medical decision making, as well as reviewing the written report by the radiologist  Dg Knee Complete 4 Views  Right  11/30/2015  CLINICAL DATA:  Pain while wrestling EXAM: RIGHT KNEE - COMPLETE 4+ VIEW COMPARISON:  None. FINDINGS: Frontal, bilateral oblique, and lateral views were obtained. There is no demonstrable fracture or dislocation. There is a small joint effusion. The joint spaces appear normal. No erosive change. IMPRESSION: Small joint effusion. No fracture or dislocation. No appreciable arthropathy. Electronically Signed   By: Bretta BangWilliam  Woodruff III M.D.  On: 11/30/2015 21:07    ____________________________________________    PROCEDURES  Procedure(s) performed:       Medications - No data to display   ____________________________________________   INITIAL IMPRESSION / ASSESSMENT AND PLAN / ED COURSE  Pertinent labs & imaging results that were available during my care of the patient were reviewed by me and considered in my medical decision making (see chart for details).  Patient's diagnosis is consistent with right knee pain. X-ray reveals no acute osseous normality. Exam is reassuring.. Patient will be discharged home with prescriptions for anti-inflammatories for symptom control. Patient is to follow up with orthopedics if symptoms persist past this treatment course. Patient is given ED precautions to return to the ED for any worsening or new symptoms.     ____________________________________________  FINAL CLINICAL IMPRESSION(S) / ED DIAGNOSES  Final diagnoses:  Knee pain, acute, right      NEW MEDICATIONS STARTED DURING THIS VISIT:  New Prescriptions   NAPROXEN (NAPROSYN) 500 MG TABLET    Take 1 tablet (500 mg total) by mouth 2 (two) times daily with a meal.        This chart was dictated using voice recognition software/Dragon. Despite best efforts to proofread, errors can occur which can change the meaning. Any change was purely unintentional.    Racheal Patches, PA-C 11/30/15 2141  Phineas Semen, MD 11/30/15 2157

## 2016-02-14 ENCOUNTER — Encounter: Payer: Self-pay | Admitting: Emergency Medicine

## 2016-02-14 ENCOUNTER — Emergency Department
Admission: EM | Admit: 2016-02-14 | Discharge: 2016-02-14 | Disposition: A | Discharge status: Home / Self Care | Payer: Self-pay | Attending: Emergency Medicine | Admitting: Emergency Medicine

## 2016-02-14 ENCOUNTER — Emergency Department: Payer: Self-pay

## 2016-02-14 DIAGNOSIS — Y929 Unspecified place or not applicable: Secondary | ICD-10-CM | POA: Insufficient documentation

## 2016-02-14 DIAGNOSIS — S8391XA Sprain of unspecified site of right knee, initial encounter: Secondary | ICD-10-CM

## 2016-02-14 DIAGNOSIS — S86911A Strain of unspecified muscle(s) and tendon(s) at lower leg level, right leg, initial encounter: Secondary | ICD-10-CM

## 2016-02-14 DIAGNOSIS — X58XXXA Exposure to other specified factors, initial encounter: Secondary | ICD-10-CM | POA: Insufficient documentation

## 2016-02-14 DIAGNOSIS — Y999 Unspecified external cause status: Secondary | ICD-10-CM | POA: Insufficient documentation

## 2016-02-14 DIAGNOSIS — Z79899 Other long term (current) drug therapy: Secondary | ICD-10-CM | POA: Insufficient documentation

## 2016-02-14 DIAGNOSIS — F17210 Nicotine dependence, cigarettes, uncomplicated: Secondary | ICD-10-CM | POA: Insufficient documentation

## 2016-02-14 DIAGNOSIS — Y939 Activity, unspecified: Secondary | ICD-10-CM | POA: Insufficient documentation

## 2016-02-14 DIAGNOSIS — S86811A Strain of other muscle(s) and tendon(s) at lower leg level, right leg, initial encounter: Secondary | ICD-10-CM | POA: Insufficient documentation

## 2016-02-14 DIAGNOSIS — F332 Major depressive disorder, recurrent severe without psychotic features: Secondary | ICD-10-CM | POA: Insufficient documentation

## 2016-02-14 DIAGNOSIS — F319 Bipolar disorder, unspecified: Secondary | ICD-10-CM | POA: Insufficient documentation

## 2016-02-14 DIAGNOSIS — J45909 Unspecified asthma, uncomplicated: Secondary | ICD-10-CM | POA: Insufficient documentation

## 2016-02-14 MED ORDER — NABUMETONE 750 MG PO TABS: 750.0000 mg | ORAL_TABLET | Freq: Two times a day (BID) | ORAL | Status: DC

## 2016-02-14 MED ORDER — CYCLOBENZAPRINE HCL 5 MG PO TABS: 5.0000 mg | ORAL_TABLET | Freq: Three times a day (TID) | ORAL | Status: DC | PRN

## 2016-02-14 NOTE — Discharge Instructions (Signed)
Your exam and x-ray do not show any bone fracture, or joint dislocation. You appear to have a knee sprain with a little bit of fluid in the joint space. You should wear the knee wrap as needed for support. Rest with the leg elevated when seated and apply ice to reduce swelling. [facility] or Dr. Rosita KeaMenz for continued problems. Take the prescription meds as directed.   Elastic Bandage and RICE WHAT DOES AN ELASTIC BANDAGE DO? Elastic bandages come in different shapes and sizes. They generally provide support to your injury and reduce swelling while you are healing, but they can perform different functions. Your health care provider will help you to decide what is best for your protection, recovery, or rehabilitation following an injury. WHAT ARE SOME GENERAL TIPS FOR USING AN ELASTIC BANDAGE?  Use the bandage as directed by the maker of the bandage that you are using.  Do not wrap the bandage too tightly. This may cut off the circulation in the arm or leg in the area below the bandage.  If part of your body beyond the bandage becomes blue, numb, cold, swollen, or is more painful, your bandage is most likely too tight. If this occurs, remove your bandage and reapply it more loosely.  See your health care provider if the bandage seems to be making your problems worse rather than better.  An elastic bandage should be removed and reapplied every 3-4 hours or as directed by your health care provider. WHAT IS RICE? The routine care of many injuries includes rest, ice, compression, and elevation (RICE therapy).  Rest Rest is required to allow your body to heal. Generally, you can resume your routine activities when you are comfortable and have been given permission by your health care provider. Ice Icing your injury helps to keep the swelling down and it reduces pain. Do not apply ice directly to your skin.  Put ice in a plastic bag.  Place a towel between  your skin and the bag.  Leave the ice on for 20 minutes, 2-3 times per day. Do this for as long as you are directed by your health care provider. Compression Compression helps to keep swelling down, gives support, and helps with discomfort. Compression may be done with an elastic bandage. Elevation Elevation helps to reduce swelling and it decreases pain. If possible, your injured area should be placed at or above the level of your heart or the center of your chest. WHEN SHOULD I SEEK MEDICAL CARE? You should seek medical care if:  You have persistent pain and swelling.  Your symptoms are getting worse rather than improving. These symptoms may indicate that further evaluation or further X-rays are needed. Sometimes, X-rays may not show a small broken bone (fracture) until a number of days later. Make a follow-up appointment with your health care provider. Ask when your X-ray results will be ready. Make sure that you get your X-ray results. WHEN SHOULD I SEEK IMMEDIATE MEDICAL CARE? You should seek immediate medical care if:  You have a sudden onset of severe pain at or below the area of your injury.  You develop redness or increased swelling around your injury.  You have tingling or numbness at or below the area of your injury that does not improve after you remove the elastic bandage.   This information is not intended to replace advice given to you by your health care provider. Make sure you discuss any questions you have with  your health care provider.   Document Released: 01/25/2002 Document Revised: 04/26/2015 Document Reviewed: 03/21/2014 Elsevier Interactive Patient Education 2016 Elsevier Inc.  Knee Sprain A knee sprain is a tear in one of the strong, fibrous tissues that connect the bones (ligaments) in your knee. The severity of the sprain depends on how much of the ligament is torn. The tear can be either partial or complete. CAUSES  Often, sprains are a result of a fall  or injury. The force of the impact causes the fibers of your ligament to stretch too much. This excess tension causes the fibers of your ligament to tear. SIGNS AND SYMPTOMS  You may have some loss of motion in your knee. Other symptoms include:  Bruising.  Pain in the knee area.  Tenderness of the knee to the touch.  Swelling. DIAGNOSIS  To diagnose a knee sprain, your health care provider will physically examine your knee. Your health care provider may also suggest an X-ray exam of your knee to make sure no bones are broken. TREATMENT  If your ligament is only partially torn, treatment usually involves keeping the knee in a fixed position (immobilization) or bracing your knee for activities that require movement for several weeks. To do this, your health care provider will apply a bandage, cast, or splint to keep your knee from moving and to support your knee during movement until it heals. For a partially torn ligament, the healing process usually takes 4-6 weeks. If your ligament is completely torn, depending on which ligament it is, you may need surgery to reconnect the ligament to the bone or reconstruct it. After surgery, a cast or splint may be applied and will need to stay on your knee for 4-6 weeks while your ligament heals. HOME CARE INSTRUCTIONS  Keep your injured knee elevated to decrease swelling.  To ease pain and swelling, apply ice to the injured area:  Put ice in a plastic bag.  Place a towel between your skin and the bag.  Leave the ice on for 20 minutes, 2-3 times a day.  Only take medicine for pain as directed by your health care provider.  Do not leave your knee unprotected until pain and stiffness go away (usually 4-6 weeks).  If you have a cast or splint, do not allow it to get wet. If you have been instructed not to remove it, cover it with a plastic bag when you shower or bathe. Do not swim.  Your health care provider may suggest exercises for you to do  during your recovery to prevent or limit permanent weakness and stiffness. SEEK IMMEDIATE MEDICAL CARE IF:  Your cast or splint becomes damaged.  Your pain becomes worse.  You have significant pain, swelling, or numbness below the cast or splint. MAKE SURE YOU:  Understand these instructions.  Will watch your condition.  Will get help right away if you are not doing well or get worse.   This information is not intended to replace advice given to you by your health care provider. Make sure you discuss any questions you have with your health care provider.   Document Released: 08/05/2005 Document Revised: 08/26/2014 Document Reviewed: 03/17/2013 Elsevier Interactive Patient Education [employer] Elsevier Inc.

## 2016-02-14 NOTE — ED Notes (Signed)
Pt presents to ED with right knee pain and right ankle swelling. Pt states she injured her knee in April and was evaluated with no fx. Pt states her pain never improved and 2 days ago she was playing with her kids and she injured the knee again. Pt reports hearing a "crack" sound and now has swelling to knee and ankle. Pain with movement.

## 2016-02-14 NOTE — ED Notes (Signed)
Discharge instructions reviewed with patient. Patient verbalized understanding. Patient ambulated to lobby without difficulty.   

## 2016-02-14 NOTE — ED Provider Notes (Signed)
[facility] Emergency Department Provider Note ____________________________________________  Time seen: 2033  I have reviewed the triage vital signs and the nursing notes.  HISTORY  Chief Complaint  Knee Pain  HPI Jenny Baldwin is a 30 y.o. female visits to the ED with pain to the right knee and swelling to the right ankle after she injured her knee about 2 days prior. She describes playing with her children when she palpated gently hearing a "crack" sound. Since that time she's had swelling to the knee and the ankle. She describes pain with range of motion. She gives a remote history of an injury to her knee back in April that was evaluated here. She reports x-rays were taken and was read as negative. She reports that she never actually had complete improvement of her symptoms following the injury in April. She did not follow-up with any provider in the interim.She reports her pain an 8/10 in triage.  Past Medical History  Diagnosis Date  . Acid reflux   . Recurrent UTI     with pregnancy  . SVD (spontaneous vaginal delivery)     x 5  . Bipolar 1 disorder (HCC)   . PTSD (post-traumatic stress disorder)   . Chronic right hip pain   . Prediabetes   . Asthma     Patient Active Problem List   Diagnosis Date Noted  . Major depressive disorder, recurrent, severe without psychotic features (HCC)   . PTSD (post-traumatic stress disorder) 04/22/2013  . Cesarean delivery, without mention of indication, delivered, with or without mention of antepartum condition 05/11/2012    Past Surgical History  Procedure Laterality Date  . No past surgeries    . Cesarean section  05/08/2012    Procedure: CESAREAN SECTION;  Surgeon: Brock Bad, MD;  Location: [facility];  Service: Obstetrics;  Laterality: N/A;  Primary Cesarean Section Delivery Girl @ 1555, Apgars 9/9    Current Outpatient Rx  Name  Route  Sig  Dispense  Refill  . cephALEXin (KEFLEX) 500 MG capsule    Oral   Take 1 capsule (500 mg total) by mouth 3 (three) times daily.   36 capsule   0   . cyclobenzaprine (FLEXERIL) 5 MG tablet   Oral   Take 1 tablet (5 mg total) by mouth 3 (three) times daily as needed for muscle spasms.   15 tablet   0   . docusate sodium (COLACE) 100 MG capsule      Take 1 tablet once or twice daily as needed for constipation while taking narcotic pain medicine   30 capsule   0   . FLUoxetine (PROZAC) 20 MG capsule   Oral   Take 1 capsule (20 mg total) by mouth daily.   30 capsule   0   . HYDROcodone-acetaminophen (NORCO/VICODIN) 5-325 MG tablet   Oral   Take 1-2 tablets by mouth every 4 (four) hours as needed for moderate pain.   15 tablet   0   . nabumetone (RELAFEN) 750 MG tablet   Oral   Take 1 tablet (750 mg total) by mouth 2 (two) times daily.   30 tablet   0   . naproxen (NAPROSYN) 500 MG tablet   Oral   Take 1 tablet (500 mg total) by mouth 2 (two) times daily with a meal.   60 tablet   0   . ondansetron (ZOFRAN) 4 MG tablet   Oral   Take 1 tablet (4 mg total)  by mouth daily as needed for nausea or vomiting.   20 tablet   1   . potassium chloride SA (KLOR-CON M20) 20 MEQ tablet   Oral   Take 1 tablet (20 mEq total) by mouth daily.   7 tablet   0   . traMADol (ULTRAM) 50 MG tablet   Oral   Take 1 tablet (50 mg total) by mouth 2 (two) times daily.   10 tablet   0   . traZODone (DESYREL) 100 MG tablet   Oral   Take 1 tablet (100 mg total) by mouth at bedtime.   30 tablet   0   . valACYclovir (VALTREX) 1000 MG tablet      take 1 tablet by mouth once daily   30 tablet   10     Allergies Latex; Penicillins; Pineapple; and Vicodin  Family History  Problem Relation Age of Onset  . Anesthesia problems Neg Hx   . Diabetes Mother   . Diabetes Maternal Aunt   . Diabetes Maternal Uncle     Social History Social History  Substance Use Topics  . Smoking status: Current Every Day Smoker -- 1.00 packs/day for 14  years    Types: Cigarettes    Last Attempt to Quit: 11/08/2011  . Smokeless tobacco: Never Used  . Alcohol Use: No    Review of Systems  Constitutional: Negative for fever. Cardiovascular: Negative for chest pain. Respiratory: Negative for shortness of breath. Musculoskeletal: Negative for back pain. Right knee pain as above. Skin: Negative for rash. Neurological: Negative for headaches, focal weakness or numbness. ____________________________________________  PHYSICAL EXAM:  VITAL SIGNS: ED Triage Vitals  Enc Vitals Group     BP 02/14/16 2011 132/88 mmHg     Pulse Rate 02/14/16 2011 66     Resp 02/14/16 2011 18     Temp 02/14/16 2011 98.5 F (36.9 C)     Temp Source 02/14/16 2011 Oral     SpO2 02/14/16 2011 100 %     Weight 02/14/16 2011 173 lb 1.6 oz (78.518 kg)     Height 02/14/16 2011 5\' 2"  (1.575 m)     Head Cir --      Peak Flow --      Pain Score 02/14/16 2011 8     Pain Loc --      Pain Edu? --      Excl. in GC? --    Constitutional: Alert and oriented. Well appearing and in no distress. Head: Normocephalic and atraumatic. Hematological/Lymphatic/Immunological: No cervical lymphadenopathy. Cardiovascular: Normal rate, regular rhythm. Normal distal pulses and capillary refill bilaterally. Respiratory: Normal respiratory effort. No wheezes/rales/rhonchi. Gastrointestinal: Soft and nontender. No distention. Musculoskeletal: Patient with a right knee that present with small effusion. Patient with normal range of motion with flexion and extension. No valgus or varus joint stress as noted. Patient with normal patellar tracking without crepitance, ballottement, or laxity. Negative anterior/posterior drawer sign. No calf or Achilles tenderness. Ankle exam is normal with a negative drawer sign. No calf or Achilles tenderness is noted. Nontender with normal range of motion in all extremities.  Neurologic:  Normal gait without ataxia. Normal speech and language. No gross  focal neurologic deficits are appreciated. Skin:  Skin is warm, dry and intact. No rash noted. ____________________________________________   RADIOLOGY  Right Knee  Negative  I, Milania Haubner, Charlesetta IvoryJenise V Baldwin, personally viewed and evaluated these images (plain radiographs) as part of my medical decision making, as well as reviewing the written  report by the radiologist. ____________________________________________  PROCEDURES  Lenora BoysWatson-Jones compression wrap ____________________________________________  INITIAL IMPRESSION / ASSESSMENT AND PLAN / ED COURSE  Patient with a right knee strain with small effusion. Negative x-ray and exam does not reveal an internal derangement. She will be discharged with prescriptions for Relafen and Flexeril. She should follow-up with her provider, a local community clinic, or Dr. Rosita KeaMenz for ongoing symptom management. Rest, ice, elevate.  ____________________________________________  FINAL CLINICAL IMPRESSION(S) / ED DIAGNOSES  Final diagnoses:  Knee strain, right, initial encounter  Sprain of right knee, initial encounter     Lissa HoardJenise V Baldwin Jenny Weems, PA-C 02/15/16 0001  Minna AntisKevin Paduchowski, MD 02/16/16 2259

## 2016-02-25 ENCOUNTER — Emergency Department
Admission: EM | Admit: 2016-02-25 | Discharge: 2016-02-25 | Disposition: A | Discharge status: Home / Self Care | Payer: Self-pay | Attending: Student | Admitting: Student

## 2016-02-25 ENCOUNTER — Encounter: Payer: Self-pay | Admitting: *Deleted

## 2016-02-25 DIAGNOSIS — F319 Bipolar disorder, unspecified: Secondary | ICD-10-CM | POA: Insufficient documentation

## 2016-02-25 DIAGNOSIS — F17210 Nicotine dependence, cigarettes, uncomplicated: Secondary | ICD-10-CM | POA: Insufficient documentation

## 2016-02-25 DIAGNOSIS — R197 Diarrhea, unspecified: Secondary | ICD-10-CM | POA: Insufficient documentation

## 2016-02-25 DIAGNOSIS — R0981 Nasal congestion: Secondary | ICD-10-CM | POA: Insufficient documentation

## 2016-02-25 DIAGNOSIS — Z79899 Other long term (current) drug therapy: Secondary | ICD-10-CM | POA: Insufficient documentation

## 2016-02-25 DIAGNOSIS — R1110 Vomiting, unspecified: Secondary | ICD-10-CM

## 2016-02-25 DIAGNOSIS — J45909 Unspecified asthma, uncomplicated: Secondary | ICD-10-CM | POA: Insufficient documentation

## 2016-02-25 DIAGNOSIS — N390 Urinary tract infection, site not specified: Secondary | ICD-10-CM | POA: Insufficient documentation

## 2016-02-25 LAB — CBC
HCT: 41.3 % (ref 35.0–47.0)
Hemoglobin: 14.2 g/dL (ref 12.0–16.0)
MCH: 33.9 pg (ref 26.0–34.0)
MCHC: 34.5 g/dL (ref 32.0–36.0)
MCV: 98.2 fL (ref 80.0–100.0)
PLATELETS: 315 10*3/uL (ref 150–440)
RBC: 4.2 MIL/uL (ref 3.80–5.20)
RDW: 12.6 % (ref 11.5–14.5)
WBC: 11.2 10*3/uL — ABNORMAL HIGH (ref 3.6–11.0)

## 2016-02-25 LAB — URINALYSIS COMPLETE WITH MICROSCOPIC (ARMC ONLY)
BILIRUBIN URINE: NEGATIVE
Glucose, UA: NEGATIVE mg/dL
Ketones, ur: NEGATIVE mg/dL
NITRITE: NEGATIVE
PH: 5 (ref 5.0–8.0)
Protein, ur: 100 mg/dL — AB
Specific Gravity, Urine: 1.033 — ABNORMAL HIGH (ref 1.005–1.030)

## 2016-02-25 LAB — COMPREHENSIVE METABOLIC PANEL
ALBUMIN: 4.6 g/dL (ref 3.5–5.0)
ALK PHOS: 54 U/L (ref 38–126)
ALT: 10 U/L — AB (ref 14–54)
ANION GAP: 9 (ref 5–15)
AST: 19 U/L (ref 15–41)
BILIRUBIN TOTAL: 0.4 mg/dL (ref 0.3–1.2)
BUN: 18 mg/dL (ref 6–20)
CALCIUM: 9.1 mg/dL (ref 8.9–10.3)
CO2: 20 mmol/L — AB (ref 22–32)
CREATININE: 0.86 mg/dL (ref 0.44–1.00)
Chloride: 108 mmol/L (ref 101–111)
GFR calc Af Amer: >60 mL/min (ref >60)
GFR calc non Af Amer: >60 mL/min (ref >60)
GLUCOSE: 136 mg/dL — AB (ref 65–99)
Potassium: 3.5 mmol/L (ref 3.5–5.1)
SODIUM: 137 mmol/L (ref 135–145)
TOTAL PROTEIN: 8.3 g/dL — AB (ref 6.5–8.1)

## 2016-02-25 LAB — POCT PREGNANCY, URINE: PREG TEST UR: NEGATIVE

## 2016-02-25 LAB — LIPASE, BLOOD: Lipase: 19 U/L (ref 11–51)

## 2016-02-25 MED ORDER — GI COCKTAIL ~~LOC~~
30.0000 mL | Freq: Once | ORAL | Status: AC
  Administered 2016-02-25: 30 mL via ORAL
  Filled 2016-02-25: qty 30

## 2016-02-25 MED ORDER — ONDANSETRON 4 MG PO TBDP
4.0000 mg | ORAL_TABLET | Freq: Once | ORAL | Status: AC | PRN
  Administered 2016-02-25: 4 mg via ORAL
  Filled 2016-02-25: qty 1

## 2016-02-25 MED ORDER — FLUTICASONE PROPIONATE 50 MCG/ACT NA SUSP: 1.0000 | Freq: Every day | NASAL | Status: DC

## 2016-02-25 MED ORDER — CIPROFLOXACIN HCL 500 MG PO TABS: 500.0000 mg | ORAL_TABLET | Freq: Two times a day (BID) | ORAL | Status: DC

## 2016-02-25 MED ORDER — ONDANSETRON 4 MG PO TBDP: 4.0000 mg | ORAL_TABLET | Freq: Three times a day (TID) | ORAL | Status: DC | PRN

## 2016-02-25 MED ORDER — CIPROFLOXACIN HCL 500 MG PO TABS
ORAL_TABLET | ORAL | Status: AC
  Administered 2016-02-25: 500 mg via ORAL
  Filled 2016-02-25: qty 1

## 2016-02-25 MED ORDER — CIPROFLOXACIN HCL 500 MG PO TABS
500.0000 mg | ORAL_TABLET | Freq: Once | ORAL | Status: AC
  Administered 2016-02-25: 500 mg via ORAL

## 2016-02-25 NOTE — ED Provider Notes (Signed)
[facility] Emergency Department Provider Note   ____________________________________________  Time seen: Approximately 8:39 PM  I have reviewed the triage vital signs and the nursing notes.   HISTORY  Chief Complaint Hematemesis    HPI Jenny Baldwin is a 30 y.o. female with history of bipolar disorder, PTSD, recurrent urinary tract infections, GERD, asthma, chronic right hip pain who presents for evaluation of 3 days nausea vomiting diarrhea and mild epigastric pain, gradual onset, constant, no modifying factors, currently mild. Patient reports that over the past 3 days she has had vomiting intermittently the vomit has had small streaks of blood in it but there has been no frank hematemesis. She has also had nonbloody diarrhea. No fevers or chills. No chest pain or difficulty breathing. She has had nasal congestion for several weeks on and off. She has had "pressure" in her bladder when she urinates.   Past Medical History  Diagnosis Date  . Acid reflux   . Recurrent UTI     with pregnancy  . SVD (spontaneous vaginal delivery)     x 5  . Bipolar 1 disorder (HCC)   . PTSD (post-traumatic stress disorder)   . Chronic right hip pain   . Prediabetes   . Asthma     Patient Active Problem List   Diagnosis Date Noted  . Major depressive disorder, recurrent, severe without psychotic features (HCC)   . PTSD (post-traumatic stress disorder) 04/22/2013  . Cesarean delivery, without mention of indication, delivered, with or without mention of antepartum condition 05/11/2012    Past Surgical History  Procedure Laterality Date  . No past surgeries    . Cesarean section  05/08/2012    Procedure: CESAREAN SECTION;  Surgeon: Brock Bad, MD;  Location: [facility];  Service: Obstetrics;  Laterality: N/A;  Primary Cesarean Section Delivery Girl @ 1555, Apgars 9/9    Current Outpatient Rx  Name  Route  Sig  Dispense  Refill  . cephALEXin (KEFLEX) 500 MG  capsule   Oral   Take 1 capsule (500 mg total) by mouth 3 (three) times daily.   36 capsule   0   . ciprofloxacin (CIPRO) 500 MG tablet   Oral   Take 1 tablet (500 mg total) by mouth 2 (two) times daily.   14 tablet   0   . cyclobenzaprine (FLEXERIL) 5 MG tablet   Oral   Take 1 tablet (5 mg total) by mouth 3 (three) times daily as needed for muscle spasms.   15 tablet   0   . docusate sodium (COLACE) 100 MG capsule      Take 1 tablet once or twice daily as needed for constipation while taking narcotic pain medicine   30 capsule   0   . FLUoxetine (PROZAC) 20 MG capsule   Oral   Take 1 capsule (20 mg total) by mouth daily.   30 capsule   0   . fluticasone (FLONASE) 50 MCG/ACT nasal spray   Each Nare   Place 1 spray into both nostrils daily.   16 g   0   . HYDROcodone-acetaminophen (NORCO/VICODIN) 5-325 MG tablet   Oral   Take 1-2 tablets by mouth every 4 (four) hours as needed for moderate pain.   15 tablet   0   . nabumetone (RELAFEN) 750 MG tablet   Oral   Take 1 tablet (750 mg total) by mouth 2 (two) times daily.   30 tablet   0   .  naproxen (NAPROSYN) 500 MG tablet   Oral   Take 1 tablet (500 mg total) by mouth 2 (two) times daily with a meal.   60 tablet   0   . ondansetron (ZOFRAN ODT) 4 MG disintegrating tablet   Oral   Take 1 tablet (4 mg total) by mouth every 8 (eight) hours as needed for nausea or vomiting.   8 tablet   0   . ondansetron (ZOFRAN) 4 MG tablet   Oral   Take 1 tablet (4 mg total) by mouth daily as needed for nausea or vomiting.   20 tablet   1   . potassium chloride SA (KLOR-CON M20) 20 MEQ tablet   Oral   Take 1 tablet (20 mEq total) by mouth daily.   7 tablet   0   . traMADol (ULTRAM) 50 MG tablet   Oral   Take 1 tablet (50 mg total) by mouth 2 (two) times daily.   10 tablet   0   . traZODone (DESYREL) 100 MG tablet   Oral   Take 1 tablet (100 mg total) by mouth at bedtime.   30 tablet   0   . valACYclovir  (VALTREX) 1000 MG tablet      take 1 tablet by mouth once daily   30 tablet   10     Allergies Latex; Penicillins; Pineapple; and Vicodin  Family History  Problem Relation Age of Onset  . Anesthesia problems Neg Hx   . Diabetes Mother   . Diabetes Maternal Aunt   . Diabetes Maternal Uncle     Social History Social History  Substance Use Topics  . Smoking status: Current Every Day Smoker -- 1.00 packs/day for 14 years    Types: Cigarettes    Last Attempt to Quit: 11/08/2011  . Smokeless tobacco: Never Used  . Alcohol Use: No    Review of Systems Constitutional: No fever/chills Eyes: No visual changes. ENT: No sore throat. Cardiovascular: Denies chest pain. Respiratory: Denies shortness of breath. Gastrointestinal: + abdominal pain.  + nausea, + vomiting.  + diarrhea.  No constipation. Genitourinary: Negative for dysuria. Musculoskeletal: Negative for back pain. Skin: Negative for rash. Neurological: Negative for headaches, focal weakness or numbness.  10-point ROS otherwise negative.  ____________________________________________   PHYSICAL EXAM:  VITAL SIGNS: ED Triage Vitals  Enc Vitals Group     BP 02/25/16 2009 131/89 mmHg     Pulse Rate 02/25/16 2009 95     Resp 02/25/16 2009 16     Temp 02/25/16 2009 98.3 F (36.8 C)     Temp Source 02/25/16 2009 Oral     SpO2 02/25/16 2009 99 %     Weight 02/25/16 2009 173 lb (78.472 kg)     Height 02/25/16 2009 5\' 2"  (1.575 m)     Head Cir --      Peak Flow --      Pain Score 02/25/16 2010 3     Pain Loc --      Pain Edu? --      Excl. in GC? --     Constitutional: Alert and oriented. Well appearing and in no acute distress. Eyes: Conjunctivae are normal. PERRL. EOMI. Head: Atraumatic. Nose: No congestion/rhinnorhea. Mouth/Throat: Mucous membranes are moist.  Oropharynx non-erythematous. Neck: No stridor.   Cardiovascular: Normal rate, regular rhythm. Grossly normal heart sounds.  Good peripheral  circulation. Respiratory: Normal respiratory effort.  No retractions. Lungs CTAB. Gastrointestinal: Soft and nontender. No distention. No CVA  tenderness. Genitourinary: deferred Musculoskeletal: No lower extremity tenderness nor edema.  No joint effusions. Neurologic:  Normal speech and language. No gross focal neurologic deficits are appreciated. No gait instability. Skin:  Skin is warm, dry and intact. No rash noted. Psychiatric: Mood and affect are normal. Speech and behavior are normal.  ____________________________________________   LABS (all labs ordered are listed, but only abnormal results are displayed)  Labs Reviewed  COMPREHENSIVE METABOLIC PANEL - Abnormal; Notable for the following:    CO2 20 (*)    Glucose, Bld 136 (*)    Total Protein 8.3 (*)    ALT 10 (*)    All other components within normal limits  CBC - Abnormal; Notable for the following:    WBC 11.2 (*)    All other components within normal limits  URINALYSIS COMPLETEWITH MICROSCOPIC ([facility] ONLY) - Abnormal; Notable for the following:    Color, Urine YELLOW (*)    APPearance HAZY (*)    Specific Gravity, Urine 1.033 (*)    Hgb urine dipstick 1+ (*)    Protein, ur 100 (*)    Leukocytes, UA 1+ (*)    Bacteria, UA RARE (*)    Squamous Epithelial / LPF 6-30 (*)    All other components within normal limits  URINE CULTURE  LIPASE, BLOOD  POC URINE PREG, ED  POCT PREGNANCY, URINE   ____________________________________________  EKG  none ____________________________________________  RADIOLOGY  none ____________________________________________   PROCEDURES  Procedure(s) performed: None  Procedures  Critical Care performed: No  ____________________________________________   INITIAL IMPRESSION / ASSESSMENT AND PLAN / ED COURSE  Pertinent labs & imaging results that were available during my care of the patient were reviewed by me and considered in my medical decision making (see chart for  details).  Jenny Baldwin is a 30 y.o. female with history of bipolar disorder, PTSD, recurrent urinary tract infections, GERD, asthma, chronic right hip pain who presents for evaluation of 3 days nausea vomiting diarrhea and mild epigastric pain. What was most concerning to her is that when she has vomited she has seen small streaks of blood in the vomit. She showed me a picture of this, there is no frank hematemesis. Suspect mild gastritis. She has a benign abdominal exam, is tolerating by mouth intake in the emergency department without vomiting. I reviewed her labs, CBC and CMP are generally unremarkable. Negative pregnancy test, normal lipase. Urinalysis with 1+ leukocytes 6-30 squamous cells, 6-30 red and white blood cells and given her history of recurrent UTIs, pressure in her bladder with urination we'll treat for urinary tract infection with ciprofloxacin. I have chosen this because she has a penicillin allergy and because [facility] and she voices considerable concern about cost. We'll send urine culture. Doubt any acute gallbladder pathology, no tenderness to palpation in the right lower quadrant, doubt obstruction, perforation or any acute life-threatening pathology in this very well-appearing patient. I will also discharge her with Zofran and Flonase. We discussed meticulous return precautions, need for close primary care doctor follow-up and she is comfortable with the discharge plan. DC home. ____________________________________________   FINAL CLINICAL IMPRESSION(S) / ED DIAGNOSES  Final diagnoses:  Vomiting and diarrhea  UTI (lower urinary tract infection)  Nasal congestion      NEW MEDICATIONS STARTED DURING THIS VISIT:  New Prescriptions   CIPROFLOXACIN (CIPRO) 500 MG TABLET    Take 1 tablet (500 mg total) by mouth 2 (two) times daily.   FLUTICASONE (  FLONASE) 50 MCG/ACT NASAL SPRAY    Place 1 spray into both nostrils daily.   ONDANSETRON  (ZOFRAN ODT) 4 MG DISINTEGRATING TABLET    Take 1 tablet (4 mg total) by mouth every 8 (eight) hours as needed for nausea or vomiting.     Note:  This document was prepared using Dragon voice recognition software and may include unintentional dictation errors.    Gayla Doss, MD 02/25/16 [id number]

## 2016-02-25 NOTE — ED Notes (Addendum)
Pt presents w/ c/o vomiting blood x 3 days. Pt states she has no insurance and has not f/u w/ a PCP regarding increased vomiting over past 3 days. Pt states she has been continuing to eat and drink fluids. Pt states she is continuing to urinate normally. Pt does not complain about abdominal pain and rates it low when asked, relating it to vomiting. Pt also c/o blisters on fingertips. Pt has been taking OTC meds for her acid reflux. Pt is unable to obtain other regular meds due to lack of insurance.

## 2016-02-27 LAB — URINE CULTURE

## 2016-06-04 ENCOUNTER — Emergency Department: Payer: Self-pay

## 2016-06-04 ENCOUNTER — Emergency Department
Admission: EM | Admit: 2016-06-04 | Discharge: 2016-06-04 | Disposition: A | Discharge status: Home / Self Care | Payer: Self-pay | Attending: Emergency Medicine | Admitting: Emergency Medicine

## 2016-06-04 ENCOUNTER — Encounter: Payer: Self-pay | Admitting: Emergency Medicine

## 2016-06-04 DIAGNOSIS — S62396A Other fracture of fifth metacarpal bone, right hand, initial encounter for closed fracture: Secondary | ICD-10-CM | POA: Insufficient documentation

## 2016-06-04 DIAGNOSIS — Y9389 Activity, other specified: Secondary | ICD-10-CM | POA: Insufficient documentation

## 2016-06-04 DIAGNOSIS — Z91040 Latex allergy status: Secondary | ICD-10-CM | POA: Insufficient documentation

## 2016-06-04 DIAGNOSIS — S62339A Displaced fracture of neck of unspecified metacarpal bone, initial encounter for closed fracture: Secondary | ICD-10-CM

## 2016-06-04 DIAGNOSIS — Z79899 Other long term (current) drug therapy: Secondary | ICD-10-CM | POA: Insufficient documentation

## 2016-06-04 DIAGNOSIS — J45909 Unspecified asthma, uncomplicated: Secondary | ICD-10-CM | POA: Insufficient documentation

## 2016-06-04 DIAGNOSIS — Y999 Unspecified external cause status: Secondary | ICD-10-CM | POA: Insufficient documentation

## 2016-06-04 DIAGNOSIS — W2201XA Walked into wall, initial encounter: Secondary | ICD-10-CM | POA: Insufficient documentation

## 2016-06-04 DIAGNOSIS — Y929 Unspecified place or not applicable: Secondary | ICD-10-CM | POA: Insufficient documentation

## 2016-06-04 DIAGNOSIS — F17210 Nicotine dependence, cigarettes, uncomplicated: Secondary | ICD-10-CM | POA: Insufficient documentation

## 2016-06-04 MED ORDER — IBUPROFEN 800 MG PO TABS: 800.0000 mg | ORAL_TABLET | Freq: Three times a day (TID) | ORAL | 0 refills | Status: DC | PRN

## 2016-06-04 NOTE — ED Notes (Signed)

## 2016-06-04 NOTE — ED Notes (Addendum)
Pt.  Reports numbness and tingling and 9/10 pain to right hand, last 3 digits since punching brick wall at 830 pm today. Swelling  And bruising noted to injured site.

## 2016-06-04 NOTE — ED Provider Notes (Signed)
[facility] Emergency Department Provider Note  ____________________________________________  Time seen: Approximately 10:56 PM  I have reviewed the triage vital signs and the nursing notes.   HISTORY  Chief Complaint Hand Pain    HPI Jenny Baldwin is a 30 y.o. female who presents emergency department complaining of right hand pain. Patient states that she punched a wall this evening in anger. Patient reports that she has done this in the past and broke the fifth metacarpal. Patient states that this has deformed her knuckle. She endorses sharp pain to the region. She denies any numbness or tingling. She denies any other injury. No medications prior to arrival.  Patient is a dressing herself in the third person, is mildly passive aggressive. However, patient denies any suicidal, homicidal ideations. Patient states that she just became upset and she puts the wall and she states that in retrospect she notes she should not done so. Patient does have an underlying history of psych issues.   Past Medical History:  Diagnosis Date  . Acid reflux   . Asthma   . Bipolar 1 disorder (HCC)   . Chronic right hip pain   . Prediabetes   . PTSD (post-traumatic stress disorder)   . Recurrent UTI    with pregnancy  . SVD (spontaneous vaginal delivery)    x 5    Patient Active Problem List   Diagnosis Date Noted  . Major depressive disorder, recurrent, severe without psychotic features (HCC)   . PTSD (post-traumatic stress disorder) 04/22/2013  . Cesarean delivery, without mention of indication, delivered, with or without mention of antepartum condition 05/11/2012    Past Surgical History:  Procedure Laterality Date  . CESAREAN SECTION  05/08/2012   Procedure: CESAREAN SECTION;  Surgeon: Brock Bad, MD;  Location: [facility];  Service: Obstetrics;  Laterality: N/A;  Primary Cesarean Section Delivery Girl @ 1555, Apgars 9/9  . NO PAST SURGERIES      Prior to  Admission medications   Medication Sig Start Date End Date Taking? Authorizing Provider  cephALEXin (KEFLEX) 500 MG capsule Take 1 capsule (500 mg total) by mouth 3 (three) times daily. 08/11/15   Loleta Rose, MD  ciprofloxacin (CIPRO) 500 MG tablet Take 1 tablet (500 mg total) by mouth 2 (two) times daily. 02/25/16   Gayla Doss, MD  cyclobenzaprine (FLEXERIL) 5 MG tablet Take 1 tablet (5 mg total) by mouth 3 (three) times daily as needed for muscle spasms. 02/14/16   Charlesetta Ivory Menshew, PA-C  docusate sodium (COLACE) 100 MG capsule Take 1 tablet once or twice daily as needed for constipation while taking narcotic pain medicine 10/04/15   Loleta Rose, MD  FLUoxetine (PROZAC) 20 MG capsule Take 1 capsule (20 mg total) by mouth daily. 01/17/15   Jimmy Footman, MD  fluticasone (FLONASE) 50 MCG/ACT nasal spray Place 1 spray into both nostrils daily. 02/25/16 02/24/17  Gayla Doss, MD  HYDROcodone-acetaminophen (NORCO/VICODIN) 5-325 MG tablet Take 1-2 tablets by mouth every 4 (four) hours as needed for moderate pain. 10/04/15   Loleta Rose, MD  ibuprofen (ADVIL,MOTRIN) 800 MG tablet Take 1 tablet (800 mg total) by mouth every 8 (eight) hours as needed. 06/04/16   Delorise Royals Cuthriell, PA-C  nabumetone (RELAFEN) 750 MG tablet Take 1 tablet (750 mg total) by mouth 2 (two) times daily. 02/14/16   Jenise V Bacon Menshew, PA-C  naproxen (NAPROSYN) 500 MG tablet Take 1 tablet (500 mg total) by mouth 2 (two) times daily  with a meal. 11/30/15   Delorise Royals Cuthriell, PA-C  ondansetron (ZOFRAN ODT) 4 MG disintegrating tablet Take 1 tablet (4 mg total) by mouth every 8 (eight) hours as needed for nausea or vomiting. 02/25/16   Gayla Doss, MD  ondansetron (ZOFRAN) 4 MG tablet Take 1 tablet (4 mg total) by mouth daily as needed for nausea or vomiting. 10/29/15   Jene Every, MD  potassium chloride SA (KLOR-CON M20) 20 MEQ tablet Take 1 tablet (20 mEq total) by mouth daily. 08/11/15   Loleta Rose, MD   traMADol (ULTRAM) 50 MG tablet Take 1 tablet (50 mg total) by mouth 2 (two) times daily. 11/05/15   Jenise V Bacon Menshew, PA-C  traZODone (DESYREL) 100 MG tablet Take 1 tablet (100 mg total) by mouth at bedtime. 01/17/15   Jimmy Footman, MD  valACYclovir (VALTREX) 1000 MG tablet take 1 tablet by mouth once daily 06/29/13   Brock Bad, MD    Allergies Latex; Penicillins; Pineapple; and Vicodin [hydrocodone-acetaminophen]  Family History  Problem Relation Age of Onset  . Diabetes Mother   . Diabetes Maternal Aunt   . Diabetes Maternal Uncle   . Anesthesia problems Neg Hx     Social History Social History  Substance Use Topics  . Smoking status: Current Every Day Smoker    Packs/day: 1.00    Years: 14.00    Types: Cigarettes    Last attempt to quit: 11/08/2011  . Smokeless tobacco: Never Used  . Alcohol use No     Review of Systems  Constitutional: No fever/chills Cardiovascular: no chest pain. Respiratory: no cough. No SOB. Musculoskeletal: Positive for right hand pain Skin: Negative for rash, abrasions, lacerations, ecchymosis. Neurological: Negative for headaches, focal weakness or numbness. 10-point ROS otherwise negative.  ____________________________________________   PHYSICAL EXAM:  VITAL SIGNS: ED Triage Vitals  Enc Vitals Group     BP 06/04/16 2124 126/76     Pulse Rate 06/04/16 2124 98     Resp 06/04/16 2124 18     Temp 06/04/16 2124 98.7 F (37.1 C)     Temp Source 06/04/16 2124 Oral     SpO2 06/04/16 2124 96 %     Weight --      Height --      Head Circumference --      Peak Flow --      Pain Score 06/04/16 2127 10     Pain Loc --      Pain Edu? --      Excl. in GC? --      Constitutional: Alert and oriented. Well appearing and in no acute distress. Eyes: Conjunctivae are normal. PERRL. EOMI. Head: Atraumatic. Neck: No stridor.   Cardiovascular: Normal rate, regular rhythm. Normal S1 and S2.  Good peripheral  circulation. Respiratory: Normal respiratory effort without tachypnea or retractions. Lungs CTAB. Good air entry to the bases with no decreased or absent breath sounds. Musculoskeletal: Deformity noted to right MCP joint. Patient has limited range of motion of this joint. Full range of motion of distal aspect of the fifth digit. Sensation and cap refill intact. Patient is exquisitely tender to palpation over the MCP joint of the fifth digit. Palpable abnormality. Neurologic:  Normal speech and language. No gross focal neurologic deficits are appreciated.  Skin:  Skin is warm, dry and intact. No rash noted. Psychiatric: Mood and affect are slightly pressured. Patient is a dressing herself in the third person and is mildly passive aggressive. Patient exhibits appropriate  insight and judgement. She denies any suicidal or homicidal ideations.   ____________________________________________   LABS (all labs ordered are listed, but only abnormal results are displayed)  Labs Reviewed - No data to display ____________________________________________  EKG   ____________________________________________  RADIOLOGY Festus BarrenI, Jonathan D Cuthriell, personally viewed and evaluated these images (plain radiographs) as part of my medical decision making, as well as reviewing the written report by the radiologist.  Dg Hand Complete Right  Result Date: 06/04/2016 CLINICAL DATA:  Acute onset of right hand pain, after punching brick wall. Swelling and bruising about the right hand. Initial encounter. EXAM: RIGHT HAND - COMPLETE 3+ VIEW COMPARISON:  Right hand radiographs performed 11/05/2015 FINDINGS: There appears to be a minimally displaced and volarly angulated fracture of the distal fifth metacarpal. This is new from the prior study. There is chronic deformity at the base of the fifth metacarpal. Visualized joint spaces are otherwise preserved. The carpal rows appear grossly intact, and demonstrate normal  alignment. Soft tissue swelling is noted overlying the fifth metacarpal. IMPRESSION: Apparent minimally displaced and volarly angulated fracture of the distal fifth metacarpal. Electronically Signed   By: Roanna RaiderJeffery  Chang M.D.   On: 06/04/2016 22:09    ____________________________________________    PROCEDURES  Procedure(s) performed:    .Splint Application Date/Time: 06/04/2016 11:15 PM Performed by: Gala RomneyUTHRIELL, JONATHAN D Authorized by: Gala RomneyUTHRIELL, JONATHAN D   Consent:    Consent obtained:  Verbal   Consent given by:  Patient   Risks discussed:  Pain Pre-procedure details:    Sensation:  Normal Procedure details:    Laterality:  Right   Location:  Hand   Hand:  R hand   Cast type:  Short arm   Splint type:  Ulnar gutter   Supplies:  Ortho-Glass, cotton padding and elastic bandage Post-procedure details:    Pain:  Unchanged   Sensation:  Normal   Patient tolerance of procedure:  Tolerated well, no immediate complications      Medications - No data to display   ____________________________________________   INITIAL IMPRESSION / ASSESSMENT AND PLAN / ED COURSE  Pertinent labs & imaging results that were available during my care of the patient were reviewed by me and considered in my medical decision making (see chart for details).  Review of the  [place] was performed in accordance of the [facility] prior to dispensing any controlled drugs.  Clinical Course    Patient's diagnosis is consistent with Boxer's fracture to the right hand. This is consistent with striking a wall. Patient does have slightly pressured speech, slightly passive aggressive, and is referring to herself in the third person. However, patient denies any suicidal or homicidal ideations. Patient does have appropriate insight into what going on.. Patient will be discharged home with prescriptions for either milligrams ibuprofen for pain relief. Patient is to follow up with hand surgery. Patient is given ED  precautions to return to the ED for any worsening or new symptoms.     ____________________________________________  FINAL CLINICAL IMPRESSION(S) / ED DIAGNOSES  Final diagnoses:  Closed boxer's fracture, initial encounter      NEW MEDICATIONS STARTED DURING THIS VISIT:  New Prescriptions   IBUPROFEN (ADVIL,MOTRIN) 800 MG TABLET    Take 1 tablet (800 mg total) by mouth every 8 (eight) hours as needed.        This chart was dictated using voice recognition software/Dragon. Despite best efforts to proofread, errors can occur which can change the meaning. Any change was purely unintentional.  Delorise Royals Cuthriell, PA-C 06/04/16 2316    Nita Sickle, MD 06/05/16 Izell Nambe

## 2016-06-04 NOTE — ED Triage Notes (Signed)
Pt ambulatory to triage with steady gait, no distress noted. Pt c/o right hand pain, post punching a brick wall 1 hour ago. Swelling and bruising noted to the area.

## 2016-06-11 ENCOUNTER — Encounter: Payer: Self-pay | Admitting: Emergency Medicine

## 2016-06-11 ENCOUNTER — Emergency Department
Admission: EM | Admit: 2016-06-11 | Discharge: 2016-06-11 | Disposition: A | Discharge status: Home / Self Care | Payer: Medicaid Other | Attending: Emergency Medicine | Admitting: Emergency Medicine

## 2016-06-11 DIAGNOSIS — S62339G Displaced fracture of neck of unspecified metacarpal bone, subsequent encounter for fracture with delayed healing: Secondary | ICD-10-CM

## 2016-06-11 DIAGNOSIS — W2201XD Walked into wall, subsequent encounter: Secondary | ICD-10-CM | POA: Insufficient documentation

## 2016-06-11 DIAGNOSIS — J45909 Unspecified asthma, uncomplicated: Secondary | ICD-10-CM | POA: Insufficient documentation

## 2016-06-11 DIAGNOSIS — Z79899 Other long term (current) drug therapy: Secondary | ICD-10-CM | POA: Insufficient documentation

## 2016-06-11 DIAGNOSIS — F17210 Nicotine dependence, cigarettes, uncomplicated: Secondary | ICD-10-CM | POA: Insufficient documentation

## 2016-06-11 DIAGNOSIS — S62336G Displaced fracture of neck of fifth metacarpal bone, right hand, subsequent encounter for fracture with delayed healing: Secondary | ICD-10-CM | POA: Insufficient documentation

## 2016-06-11 MED ORDER — HYDROCODONE-ACETAMINOPHEN 5-325 MG PO TABS
2.0000 | ORAL_TABLET | Freq: Once | ORAL | Status: AC
  Administered 2016-06-11: 2 via ORAL
  Filled 2016-06-11: qty 2

## 2016-06-11 MED ORDER — HYDROCODONE-ACETAMINOPHEN 5-325 MG PO TABS: 1.0000 | ORAL_TABLET | ORAL | 0 refills | Status: DC | PRN

## 2016-06-11 NOTE — ED Notes (Signed)
See triage note  States she was seen here about 1-2 weeks ago  Dx'd with fx in her hand  Presents with OCL splint in place  But states she is having sharp pain shooting into arm  Unable to see ortho d/t  No insurance

## 2016-06-11 NOTE — ED Provider Notes (Signed)
[facility] Emergency Department Provider Note  ____________________________________________   First MD Initiated Contact with Patient 06/11/16 1003     (approximate)  I have reviewed the triage vital signs and the nursing notes.   HISTORY  Chief Complaint Arm Injury    HPI Jenny Baldwin is a 30 y.o. female with a medical history that includes psychiatric illness as well as prior injuries to her dominant right hand due to punching walls in anger.  She presents today for follow-up visit after a visit one week ago in which she was diagnosed with another metacarpal fracture.  She reports that the pain is severe, worse with any kind of movement, that she has swelling.  She states that the pain radiates up into her arm.  Nothing makes it better cleaning the ibuprofen she was previously prescribed.  Because her family does not have the money to follow up with a specialist, she has not seen an orthopedic doctor about the injury.He has no new injuries at this time.  She reports having sensation in all of her fingers but highly reproducible pain with any movement of her fingers or wrist or hand.   Past Medical History:  Diagnosis Date  . Acid reflux   . Asthma   . Bipolar 1 disorder (HCC)   . Chronic right hip pain   . Prediabetes   . PTSD (post-traumatic stress disorder)   . Recurrent UTI    with pregnancy  . SVD (spontaneous vaginal delivery)    x 5    Patient Active Problem List   Diagnosis Date Noted  . Major depressive disorder, recurrent, severe without psychotic features (HCC)   . PTSD (post-traumatic stress disorder) 04/22/2013  . Cesarean delivery, without mention of indication, delivered, with or without mention of antepartum condition 05/11/2012    Past Surgical History:  Procedure Laterality Date  . CESAREAN SECTION  05/08/2012   Procedure: CESAREAN SECTION;  Surgeon: Brock Badharles A [name], MD;  Location: [facility];  Service: Obstetrics;  Laterality:  N/A;  Primary Cesarean Section Delivery Girl @ 1555, Apgars 9/9  . NO PAST SURGERIES      Prior to Admission medications   Medication Sig Start Date End Date Taking? Authorizing Provider  fluticasone (FLONASE) 50 MCG/ACT nasal spray Place 1 spray into both nostrils daily. 02/25/16 02/24/17  Gayla DossEryka A Gayle, MD  HYDROcodone-acetaminophen (NORCO/VICODIN) 5-325 MG tablet Take 1-2 tablets by mouth every 4 (four) hours as needed for moderate pain. 06/11/16   Loleta Roseory Claritza July, MD  ibuprofen (ADVIL,MOTRIN) 800 MG tablet Take 1 tablet (800 mg total) by mouth every 8 (eight) hours as needed. 06/04/16   Delorise RoyalsJonathan D Cuthriell, PA-C  traZODone (DESYREL) 100 MG tablet Take 1 tablet (100 mg total) by mouth at bedtime. 01/17/15   Jimmy FootmanAndrea Hernandez-Gonzalez, MD    Allergies Latex; Penicillins; Pineapple; and Vicodin [hydrocodone-acetaminophen]  Family History  Problem Relation Age of Onset  . Diabetes Mother   . Diabetes Maternal Aunt   . Diabetes Maternal Uncle   . Anesthesia problems Neg Hx     Social History Social History  Substance Use Topics  . Smoking status: Current Every Day Smoker    Packs/day: 1.00    Years: 14.00    Types: Cigarettes    Last attempt to quit: 11/08/2011  . Smokeless tobacco: Never Used  . Alcohol use No    Review of Systems Constitutional: No fever/chills Cardiovascular: Denies chest pain. Respiratory: Denies shortness of breath. Gastrointestinal: No abdominal pain.  No  nausea, no vomiting.  No diarrhea.  No constipation. Musculoskeletal: Severe pain in right hand with swelling Skin: Negative for rash. Neurological: Negative for headaches, focal weakness or numbness.   ____________________________________________   PHYSICAL EXAM:  VITAL SIGNS: ED Triage Vitals [06/11/16 0927]  Enc Vitals Group     BP (!) 144/81     Pulse Rate 85     Resp 20     Temp 98.2 F (36.8 C)     Temp Source Oral     SpO2 95 %     Weight 173 lb (78.5 kg)     Height      Head  Circumference      Peak Flow      Pain Score 10     Pain Loc      Pain Edu?      Excl. in GC?     Constitutional: Alert and oriented. Well appearing and in no acute distress. Eyes: Conjunctivae are normal. PERRL. EOMI. Head: Atraumatic. Nose: No congestion/rhinnorhea. Cardiovascular: Normal rate, regular rhythm. Good peripheral circulation.  Respiratory: Normal respiratory effort.  No retractions.  Musculoskeletal: Swelling and ecchymosis that appears subacute throughout much of the right hand.  It is primarily on the ulnar side consistent with her prior radiograph indicating a fracture of the distal fifth metacarpal.  She has normal sensation and all of the compartments in her hand and forearm are soft.  There is no erythema or other indication of cellulitis or deep tissue infection. Neurologic:  Normal speech and language. No gross focal neurologic deficits are appreciated.  Skin:  Skin is warm, dry and intact. No rash noted. Psychiatric: Mood and affect are normal. Speech and behavior are normal.  Patient seems to have normal decision making capacity and does have good insight into the situation with her hand and the need for follow-up.  ____________________________________________   LABS (all labs ordered are listed, but only abnormal results are displayed)  Labs Reviewed - No data to display ____________________________________________  EKG  None - EKG not ordered by ED physician ____________________________________________  RADIOLOGY   No results found.  ____________________________________________   PROCEDURES  Procedure(s) performed:   .Splint Application Date/Time: 06/11/2016 10:23 AM Performed by: Loleta Rose Authorized by: Loleta Rose   Consent:    Consent obtained:  Verbal Pre-procedure details:    Sensation:  Normal Procedure details:    Laterality:  Right   Location:  Wrist   Wrist:  R wrist   Cast type:  Short arm   Splint type:  Ulnar  gutter   Supplies:  Ortho-Glass Post-procedure details:    Patient tolerance of procedure:  Tolerated well, no immediate complications Comments:     Placed by ED tech     Critical Care performed: No ____________________________________________   INITIAL IMPRESSION / ASSESSMENT AND PLAN / ED COURSE  Pertinent labs & imaging results that were available during my care of the patient were reviewed by me and considered in my medical decision making (see chart for details).  There Is no indication to repeat imaging today.  She has no acute injury and I stressed to her during a relatively lengthy conversation that she must follow-up with a hand surgeon if she hopes to regain normal function of her dominant hand.  We replaced the existing ulnar gutter splint with one that will hopefully provide her with more comfort to accommodate for the swelling.  I gave her the usual and customary management precautions.  I looked her up in  the [place] controlled substance database and there is no record of her filling a controlled substance and the last 7 months so I gave her a short course of Norco which she has tolerated successfully in the past, but I explained that we would not be able to fill future prescriptions in the emergency department for narcotics.   ____________________________________________  FINAL CLINICAL IMPRESSION(S) / ED DIAGNOSES  Final diagnoses:  Closed boxer's fracture, with delayed healing, subsequent encounter     MEDICATIONS GIVEN DURING THIS VISIT:  Medications  HYDROcodone-acetaminophen (NORCO/VICODIN) 5-325 MG per tablet 2 tablet (2 tablets Oral Given 06/11/16 1017)     NEW OUTPATIENT MEDICATIONS STARTED DURING THIS VISIT:  New Prescriptions   HYDROCODONE-ACETAMINOPHEN (NORCO/VICODIN) 5-325 MG TABLET    Take 1-2 tablets by mouth every 4 (four) hours as needed for moderate pain.    Modified Medications   No medications on file    Discontinued Medications    CEPHALEXIN (KEFLEX) 500 MG CAPSULE    Take 1 capsule (500 mg total) by mouth 3 (three) times daily.   CIPROFLOXACIN (CIPRO) 500 MG TABLET    Take 1 tablet (500 mg total) by mouth 2 (two) times daily.   CYCLOBENZAPRINE (FLEXERIL) 5 MG TABLET    Take 1 tablet (5 mg total) by mouth 3 (three) times daily as needed for muscle spasms.   DOCUSATE SODIUM (COLACE) 100 MG CAPSULE    Take 1 tablet once or twice daily as needed for constipation while taking narcotic pain medicine   FLUOXETINE (PROZAC) 20 MG CAPSULE    Take 1 capsule (20 mg total) by mouth daily.   HYDROCODONE-ACETAMINOPHEN (NORCO/VICODIN) 5-325 MG TABLET    Take 1-2 tablets by mouth every 4 (four) hours as needed for moderate pain.   NABUMETONE (RELAFEN) 750 MG TABLET    Take 1 tablet (750 mg total) by mouth 2 (two) times daily.   NAPROXEN (NAPROSYN) 500 MG TABLET    Take 1 tablet (500 mg total) by mouth 2 (two) times daily with a meal.   ONDANSETRON (ZOFRAN ODT) 4 MG DISINTEGRATING TABLET    Take 1 tablet (4 mg total) by mouth every 8 (eight) hours as needed for nausea or vomiting.   ONDANSETRON (ZOFRAN) 4 MG TABLET    Take 1 tablet (4 mg total) by mouth daily as needed for nausea or vomiting.   POTASSIUM CHLORIDE SA (KLOR-CON M20) 20 MEQ TABLET    Take 1 tablet (20 mEq total) by mouth daily.   TRAMADOL (ULTRAM) 50 MG TABLET    Take 1 tablet (50 mg total) by mouth 2 (two) times daily.   VALACYCLOVIR (VALTREX) 1000 MG TABLET    take 1 tablet by mouth once daily     Note:  This document was prepared using Dragon voice recognition software and may include unintentional dictation errors.    Loleta Rose, MD 06/11/16 1023

## 2016-06-11 NOTE — ED Triage Notes (Signed)
Pt with right arm pain. Pt currently in cast from broken arm from last week. Unable to see ortho.

## 2016-06-11 NOTE — Discharge Instructions (Signed)
As we discussed, it is absolutely critical that you follow-up with a hand specialist.  Particularly given that you have prior injuries in that hand, it will not heal correctly and he will not have normal function if you do not see a specialist.  Please call the number above and discuss follow-up options and payment plan options that may be available to you.  If that does not work out, we recommend that you call [facility] at [phone] and see if they may be able to help you.  Continue to use the prescribed ibuprofen - this will help with pain and inflammation.  Take Norco as prescribed for severe pain. Do not drink alcohol, drive or participate in any other potentially dangerous activities while taking this medication as it may make you sleepy. Do not take this medication with any other sedating medications, either prescription or over-the-counter. If you were prescribed Percocet or Vicodin, do not take these with acetaminophen (Tylenol) as it is already contained within these medications.  Remember that the Emergency Department will not be able to continue filling prescriptions for pain medication - you must follow up with a specialist.   This medication is an opiate (or narcotic) pain medication and can be habit forming.  Use it as little as possible to achieve adequate pain control.  Do not use or use it with extreme caution if you have a history of opiate abuse or dependence.  If you are on a pain contract with your primary care doctor or a pain specialist, be sure to let them know you were prescribed this medication today from the [facility].  This medication is intended for your use only - do not give any to anyone else and keep it in a secure place where nobody else, especially children, have access to it.  It will also cause or worsen constipation, so you may want to consider taking an over-the-counter stool softener while you are taking this medication.

## 2016-08-08 ENCOUNTER — Emergency Department: Payer: Medicaid Other

## 2016-08-08 ENCOUNTER — Emergency Department
Admission: EM | Admit: 2016-08-08 | Discharge: 2016-08-08 | Disposition: A | Discharge status: Home / Self Care | Payer: Medicaid Other | Attending: Emergency Medicine | Admitting: Emergency Medicine

## 2016-08-08 DIAGNOSIS — S62339P Displaced fracture of neck of unspecified metacarpal bone, subsequent encounter for fracture with malunion: Secondary | ICD-10-CM

## 2016-08-08 DIAGNOSIS — Z91040 Latex allergy status: Secondary | ICD-10-CM | POA: Insufficient documentation

## 2016-08-08 DIAGNOSIS — Z791 Long term (current) use of non-steroidal anti-inflammatories (NSAID): Secondary | ICD-10-CM | POA: Insufficient documentation

## 2016-08-08 DIAGNOSIS — W228XXD Striking against or struck by other objects, subsequent encounter: Secondary | ICD-10-CM | POA: Insufficient documentation

## 2016-08-08 DIAGNOSIS — J45909 Unspecified asthma, uncomplicated: Secondary | ICD-10-CM | POA: Insufficient documentation

## 2016-08-08 DIAGNOSIS — F17210 Nicotine dependence, cigarettes, uncomplicated: Secondary | ICD-10-CM | POA: Insufficient documentation

## 2016-08-08 DIAGNOSIS — Z79899 Other long term (current) drug therapy: Secondary | ICD-10-CM | POA: Insufficient documentation

## 2016-08-08 DIAGNOSIS — S62396P Other fracture of fifth metacarpal bone, right hand, subsequent encounter for fracture with malunion: Secondary | ICD-10-CM | POA: Insufficient documentation

## 2016-08-08 MED ORDER — TRAMADOL HCL 50 MG PO TABS: 50.0000 mg | ORAL_TABLET | Freq: Four times a day (QID) | ORAL | 0 refills | Status: AC | PRN

## 2016-08-08 NOTE — ED Provider Notes (Signed)
[facility] Emergency Department Provider Note   ____________________________________________   First MD Initiated Contact with Patient 08/08/16 1743     (approximate)  I have reviewed the triage vital signs and the nursing notes.   HISTORY  Chief Complaint Hand Pain    HPI Jenny Baldwin is a 30 y.o. female patient complaining of pain to the right hand secondary to a fracture 2 months ago. Patient is a boxer fracture of the right dominant hand did not follow orthopedics as directed. Patient states she did not follow secondary to a lack of insurance. Patient said her son hit the hand today causing increasing pain. No palliative measures taken for this complaint. Patient denies loss sensation or decreased function with flexion and extension of the fifth digit. Patient rated the pain as a 10 over 10. Patient described a pain as"achy".   Past Medical History:  Diagnosis Date  . Acid reflux   . Asthma   . Bipolar 1 disorder (HCC)   . Chronic right hip pain   . Prediabetes   . PTSD (post-traumatic stress disorder)   . Recurrent UTI    with pregnancy  . SVD (spontaneous vaginal delivery)    x 5    Patient Active Problem List   Diagnosis Date Noted  . Major depressive disorder, recurrent, severe without psychotic features (HCC)   . PTSD (post-traumatic stress disorder) 04/22/2013  . Cesarean delivery, without mention of indication, delivered, with or without mention of antepartum condition 05/11/2012    Past Surgical History:  Procedure Laterality Date  . CESAREAN SECTION  05/08/2012   Procedure: CESAREAN SECTION;  Surgeon: Brock Badharles A [name], MD;  Location: [facility];  Service: Obstetrics;  Laterality: N/A;  Primary Cesarean Section Delivery Girl @ 1555, Apgars 9/9  . NO PAST SURGERIES      Prior to Admission medications   Medication Sig Start Date End Date Taking? Authorizing Provider  fluticasone (FLONASE) 50 MCG/ACT nasal spray Place 1 spray into  both nostrils daily. 02/25/16 02/24/17  Gayla DossEryka A Gayle, MD  HYDROcodone-acetaminophen (NORCO/VICODIN) 5-325 MG tablet Take 1-2 tablets by mouth every 4 (four) hours as needed for moderate pain. 06/11/16   Loleta Roseory Forbach, MD  ibuprofen (ADVIL,MOTRIN) 800 MG tablet Take 1 tablet (800 mg total) by mouth every 8 (eight) hours as needed. 06/04/16   Delorise RoyalsJonathan D Cuthriell, PA-C  traMADol (ULTRAM) 50 MG tablet Take 1 tablet (50 mg total) by mouth every 6 (six) hours as needed. 08/08/16 08/08/17  Joni Reiningonald K Axzel [place], PA-C  traZODone (DESYREL) 100 MG tablet Take 1 tablet (100 mg total) by mouth at bedtime. 01/17/15   Jimmy FootmanAndrea Hernandez-Gonzalez, MD    Allergies Latex; Penicillins; Pineapple; and Vicodin [hydrocodone-acetaminophen]  Family History  Problem Relation Age of Onset  . Diabetes Mother   . Diabetes Maternal Aunt   . Diabetes Maternal Uncle   . Anesthesia problems Neg Hx     Social History Social History  Substance Use Topics  . Smoking status: Current Every Day Smoker    Packs/day: 1.00    Years: 14.00    Types: Cigarettes    Last attempt to quit: 11/08/2011  . Smokeless tobacco: Never Used  . Alcohol use No    Review of Systems Constitutional: No fever/chills Eyes: No visual changes. ENT: No sore throat. Cardiovascular: Denies chest pain. Respiratory: Denies shortness of breath. Gastrointestinal: No abdominal pain.  No nausea, no vomiting.  No diarrhea.  No constipation. Genitourinary: Negative for dysuria. Musculoskeletal: Negative for back  pain. Skin: Negative for rash. Neurological: Negative for headaches, focal weakness or numbness. Psychiatric: Bipolar and PTSD. Endocrine:Prediabetic Hematological/Lymphatic: Allergic/Immunilogical: See medication list ____________________________________________   PHYSICAL EXAM:  VITAL SIGNS: ED Triage Vitals [08/08/16 1729]  Enc Vitals Group     BP 136/88     Pulse Rate (!) 102     Resp 18     Temp 98.4 F (36.9 C)     Temp Source  Oral     SpO2 97 %     Weight 169 lb (76.7 kg)     Height 5\' 2"  (1.575 m)     Head Circumference      Peak Flow      Pain Score 10     Pain Loc      Pain Edu?      Excl. in GC?     Constitutional: Alert and oriented. Well appearing and in no acute distress. Eyes: Conjunctivae are normal. PERRL. EOMI. Head: Atraumatic. Nose: No congestion/rhinnorhea. Mouth/Throat: Mucous membranes are moist.  Oropharynx non-erythematous. Neck: No stridor.  No cervical spine tenderness to palpation. Hematological/Lymphatic/Immunilogical: No cervical lymphadenopathy. Cardiovascular: Normal rate, regular rhythm. Grossly normal heart sounds.  Good peripheral circulation. Respiratory: Normal respiratory effort.  No retractions. Lungs CTAB. Gastrointestinal: Soft and nontender. No distention. No abdominal bruits. No CVA tenderness. Musculoskeletal: Obvious deformity to the fifth digit right hand.  Neurologic:  Normal speech and language. No gross focal neurologic deficits are appreciated. No gait instability. Skin:  Skin is warm, dry and intact. No rash noted. Psychiatric: Mood and affect are normal. Speech and behavior are normal.  ____________________________________________   LABS (all labs ordered are listed, but only abnormal results are displayed)  Labs Reviewed - No data to display ____________________________________________  EKG   ____________________________________________  RADIOLOGY  X-ray reveals a healed deformity to the fifth metacarpal. ____________________________________________   PROCEDURES  Procedure(s) performed: None  Procedures  Critical Care performed: No  ____________________________________________   INITIAL IMPRESSION / ASSESSMENT AND PLAN / ED COURSE  Pertinent labs & imaging results that were available during my care of the patient were reviewed by me and considered in my medical decision making (see chart for details).  Healed to deformity to the  fifth digit right hand. Patient given discharge care instructions. Patient placed in an ulnar gutter splint and sling and advised to follow-up orthopedics for definitive evaluation and treatment.  Clinical Course      ____________________________________________   FINAL CLINICAL IMPRESSION(S) / ED DIAGNOSES  Final diagnoses:  Open boxer's fracture with malunion, subsequent encounter      NEW MEDICATIONS STARTED DURING THIS VISIT:  New Prescriptions   TRAMADOL (ULTRAM) 50 MG TABLET    Take 1 tablet (50 mg total) by mouth every 6 (six) hours as needed.     Note:  This document was prepared using Dragon voice recognition software and may include unintentional dictation errors.    Joni ReiningRonald K Myrka Sylva, PA-C 08/08/16 Rickey [id number]    Myrna Blazeravid Matthew Schaevitz, MD 08/08/16 2221

## 2016-08-08 NOTE — Discharge Instructions (Signed)
Wear sling and splint until evaluation by orthopedic Dr.

## 2016-08-08 NOTE — ED Triage Notes (Signed)
Pt states she broke her right hand 2 months ago and did not follow up with ortho, states the pain is not better.

## 2017-02-02 ENCOUNTER — Emergency Department (HOSPITAL_COMMUNITY)
Admission: EM | Admit: 2017-02-02 | Discharge: 2017-02-02 | Disposition: A | Discharge status: Home / Self Care | Payer: Medicaid Other | Attending: Emergency Medicine | Admitting: Emergency Medicine

## 2017-02-02 ENCOUNTER — Emergency Department (HOSPITAL_COMMUNITY): Payer: Medicaid Other

## 2017-02-02 ENCOUNTER — Encounter (HOSPITAL_COMMUNITY): Payer: Self-pay

## 2017-02-02 DIAGNOSIS — Z79899 Other long term (current) drug therapy: Secondary | ICD-10-CM | POA: Insufficient documentation

## 2017-02-02 DIAGNOSIS — G8929 Other chronic pain: Secondary | ICD-10-CM | POA: Insufficient documentation

## 2017-02-02 DIAGNOSIS — R569 Unspecified convulsions: Secondary | ICD-10-CM | POA: Insufficient documentation

## 2017-02-02 DIAGNOSIS — Z7982 Long term (current) use of aspirin: Secondary | ICD-10-CM | POA: Insufficient documentation

## 2017-02-02 DIAGNOSIS — F17210 Nicotine dependence, cigarettes, uncomplicated: Secondary | ICD-10-CM | POA: Insufficient documentation

## 2017-02-02 DIAGNOSIS — Z91040 Latex allergy status: Secondary | ICD-10-CM | POA: Insufficient documentation

## 2017-02-02 DIAGNOSIS — J45909 Unspecified asthma, uncomplicated: Secondary | ICD-10-CM | POA: Insufficient documentation

## 2017-02-02 LAB — ETHANOL: Alcohol, Ethyl (B): <5 mg/dL (ref <5)

## 2017-02-02 LAB — RAPID URINE DRUG SCREEN, HOSP PERFORMED
Amphetamines: NOT DETECTED
Barbiturates: NOT DETECTED
Benzodiazepines: NOT DETECTED
COCAINE: NOT DETECTED
OPIATES: NOT DETECTED
Tetrahydrocannabinol: NOT DETECTED

## 2017-02-02 LAB — BASIC METABOLIC PANEL
ANION GAP: 8 (ref 5–15)
BUN: 8 mg/dL (ref 6–20)
CALCIUM: 9.5 mg/dL (ref 8.9–10.3)
CO2: 23 mmol/L (ref 22–32)
Chloride: 106 mmol/L (ref 101–111)
Creatinine, Ser: 0.81 mg/dL (ref 0.44–1.00)
GFR calc Af Amer: >60 mL/min (ref >60)
Glucose, Bld: 127 mg/dL — ABNORMAL HIGH (ref 65–99)
Potassium: 4 mmol/L (ref 3.5–5.1)
SODIUM: 137 mmol/L (ref 135–145)

## 2017-02-02 LAB — CBC
HCT: 42.6 % (ref 36.0–46.0)
Hemoglobin: 14.6 g/dL (ref 12.0–15.0)
MCH: 33 pg (ref 26.0–34.0)
MCHC: 34.3 g/dL (ref 30.0–36.0)
MCV: 96.2 fL (ref 78.0–100.0)
PLATELETS: 304 10*3/uL (ref 150–400)
RBC: 4.43 MIL/uL (ref 3.87–5.11)
RDW: 12.3 % (ref 11.5–15.5)
WBC: 11.9 10*3/uL — ABNORMAL HIGH (ref 4.0–10.5)

## 2017-02-02 LAB — HEPATIC FUNCTION PANEL
ALT: 13 U/L — ABNORMAL LOW (ref 14–54)
AST: 20 U/L (ref 15–41)
Albumin: 4.2 g/dL (ref 3.5–5.0)
Alkaline Phosphatase: 45 U/L (ref 38–126)
BILIRUBIN DIRECT: 0.1 mg/dL (ref 0.1–0.5)
BILIRUBIN INDIRECT: 0.2 mg/dL — AB (ref 0.3–0.9)
Total Bilirubin: 0.3 mg/dL (ref 0.3–1.2)
Total Protein: 7.8 g/dL (ref 6.5–8.1)

## 2017-02-02 LAB — I-STAT BETA HCG BLOOD, ED (MC, WL, AP ONLY): I-stat hCG, quantitative: <5 m[IU]/mL (ref <5)

## 2017-02-02 LAB — CBG MONITORING, ED: GLUCOSE-CAPILLARY: 102 mg/dL — AB (ref 65–99)

## 2017-02-02 MED ORDER — LEVETIRACETAM 500 MG PO TABS: 500.0000 mg | ORAL_TABLET | Freq: Two times a day (BID) | ORAL | 0 refills | Status: DC

## 2017-02-02 MED ORDER — SODIUM CHLORIDE 0.9 % IV SOLN
1000.0000 mg | Freq: Once | INTRAVENOUS | Status: AC
  Administered 2017-02-02: 1000 mg via INTRAVENOUS
  Filled 2017-02-02: qty 10

## 2017-02-02 NOTE — ED Notes (Signed)
Patient transported to X-ray 

## 2017-02-02 NOTE — ED Notes (Signed)
Pt ambulated in hallway to bathroom without difficulty. Pt complained of some dizziness.

## 2017-02-02 NOTE — ED Triage Notes (Signed)
Onset 5 days ago pt had seizure, then again 3 days ago.  No h/o seizures.  Since then pt has been having gait disturbances, dizziness, shaking, arm jerking, headache.

## 2017-02-02 NOTE — ED Provider Notes (Addendum)
[facility]-EMERGENCY DEPT Provider Note   CSN: [id number] Arrival date & time: 02/02/17  1638     History   Chief Complaint Chief Complaint  Patient presents with  . Seizures    HPI Jenny Baldwin is a 31 y.o. female.  HPI  patient presents with reported seizures. Reportedly had seizure 5 days ago and then again 3 days ago. Has video of the 12 days ago. States she gets a headache and then the seizure. No previous history of seizure before this. States she has been unsteady since the episode. States she has had some recent difficulty remembering also. States she feels a little trunk but she does not drink alcohol. She denies substance abuse. States she gets very anxious about this. States she never had a history of anxiety before these seizures.   Past Medical History:  Diagnosis Date  . Acid reflux   . Asthma   . Bipolar 1 disorder (HCC)   . Chronic right hip pain   . Prediabetes   . PTSD (post-traumatic stress disorder)   . Recurrent UTI    with pregnancy  . SVD (spontaneous vaginal delivery)    x 5    Patient Active Problem List   Diagnosis Date Noted  . Major depressive disorder, recurrent, severe without psychotic features (HCC)   . PTSD (post-traumatic stress disorder) 04/22/2013  . Cesarean delivery, without mention of indication, delivered, with or without mention of antepartum condition 05/11/2012    Past Surgical History:  Procedure Laterality Date  . CESAREAN SECTION  05/08/2012   Procedure: CESAREAN SECTION;  Surgeon: Brock Bad, MD;  Location: [facility];  Service: Obstetrics;  Laterality: N/A;  Primary Cesarean Section Delivery Girl @ 1555, Apgars 9/9  . NO PAST SURGERIES      OB History    Gravida Para Term Preterm AB Living   8 6 5 1 2 6    SAB TAB Ectopic Multiple Live Births   2 0 0 0 1       Home Medications    Prior to Admission medications   Medication Sig Start Date End Date Taking? Authorizing Provider  acetaminophen (TYLENOL) 325 MG  tablet Take 650 mg by mouth every 6 (six) hours as needed for headache.   Yes [provider]  fluticasone (FLONASE) 50 MCG/ACT nasal spray Place 1 spray into both nostrils daily. Patient not taking: Reported on 02/02/2017 02/25/16 02/24/17  Gayla Doss, MD  HYDROcodone-acetaminophen (NORCO/VICODIN) 5-325 MG tablet Take 1-2 tablets by mouth every 4 (four) hours as needed for moderate pain. Patient not taking: Reported on 02/02/2017 06/11/16   Loleta Rose, MD  ibuprofen (ADVIL,MOTRIN) 800 MG tablet Take 1 tablet (800 mg total) by mouth every 8 (eight) hours as needed. Patient not taking: Reported on 02/02/2017 06/04/16   Cuthriell, Delorise Royals, PA-C  levETIRAcetam (KEPPRA) 500 MG tablet Take 1 tablet (500 mg total) by mouth 2 (two) times daily. 02/02/17   Benjiman Core, MD  traMADol (ULTRAM) 50 MG tablet Take 1 tablet (50 mg total) by mouth every 6 (six) hours as needed. Patient not taking: Reported on 02/02/2017 08/08/16 08/08/17  Joni Reining, PA-C  traZODone (DESYREL) 100 MG tablet Take 1 tablet (100 mg total) by mouth at bedtime. Patient not taking: Reported on 02/02/2017 01/17/15   Jimmy Footman, MD    Family History Family History  Problem Relation Age of Onset  . Diabetes Mother   . Diabetes Maternal Aunt   . Diabetes Maternal Uncle   .  Anesthesia problems Neg Hx     Social History Social History  Substance Use Topics  . Smoking status: Current Every Day Smoker    Packs/day: 1.00    Years: 14.00    Types: Cigarettes    Last attempt to quit: 11/08/2011  . Smokeless tobacco: Never Used  . Alcohol use No     Allergies   Latex; Penicillins; Pineapple; and Vicodin [hydrocodone-acetaminophen]   Review of Systems Review of Systems  Constitutional: Negative for appetite change and fever.  HENT: Negative for congestion.   Respiratory: Negative for chest tightness.   Gastrointestinal: Negative for abdominal pain.  Genitourinary: Negative for flank pain.    Musculoskeletal: Negative for back pain.  Skin: Negative for rash.  Neurological: Positive for dizziness, seizures and light-headedness.  Psychiatric/Behavioral: The patient is nervous/anxious.      Physical Exam Updated Vital Signs BP 109/79   Pulse 75   Temp 98.7 F (37.1 C) (Oral)   Resp 17   LMP 01/16/2017   SpO2 100%   Physical Exam  Constitutional: She is oriented to person, place, and time. She appears well-developed and well-nourished.  HENT:  Head: Normocephalic and atraumatic.  Eyes: EOM are normal. Pupils are equal, round, and reactive to light.  Neck: Normal range of motion. Neck supple.  Cardiovascular: Normal rate, regular rhythm and normal heart sounds.   No murmur heard. Pulmonary/Chest: Effort normal and breath sounds normal. No respiratory distress. She has no wheezes. She has no rales.  Abdominal: Soft. Bowel sounds are normal. She exhibits no distension. There is no tenderness. There is no rebound and no guarding.  Musculoskeletal: Normal range of motion.  Neurological: She is alert and oriented to person, place, and time. No cranial nerve deficit. Coordination normal.  Skin: Skin is warm and dry.  Psychiatric: She has a normal mood and affect. Her speech is normal.  Nursing note and vitals reviewed.    ED Treatments / Results  Labs (all labs ordered are listed, but only abnormal results are displayed) Labs Reviewed  BASIC METABOLIC PANEL - Abnormal; Notable for the following:       Result Value   Glucose, Bld 127 (*)    All other components within normal limits  CBC - Abnormal; Notable for the following:    WBC 11.9 (*)    All other components within normal limits  HEPATIC FUNCTION PANEL - Abnormal; Notable for the following:    ALT 13 (*)    Indirect Bilirubin 0.2 (*)    All other components within normal limits  CBG MONITORING, ED - Abnormal; Notable for the following:    Glucose-Capillary 102 (*)    All other components within normal  limits  ETHANOL  RAPID URINE DRUG SCREEN, HOSP PERFORMED  I-STAT BETA HCG BLOOD, ED ([facility], [facility], [facility] ONLY)    EKG  EKG Interpretation None       Radiology Dg Chest 2 View  Result Date: 02/02/2017 CLINICAL DATA:  Initial evaluation for acute syncope, seizure. EXAM: CHEST  2 VIEW COMPARISON:  Prior radiograph from 09/25/2004. FINDINGS: The cardiac and mediastinal silhouettes are stable in size and contour, and remain within normal limits. The lungs are norm mildly hypoinflated with elevation the right hemidiaphragm. No airspace consolidation, pleural effusion, or pulmonary edema is identified. There is no pneumothorax. No acute osseous abnormality identified. IMPRESSION: No active cardiopulmonary disease. Electronically Signed   By: Rise Mu M.D.   On: 02/02/2017 17:58   Ct Head Wo Contrast  Result Date:  02/02/2017 CLINICAL DATA:  Seizures.  Headache. EXAM: CT HEAD WITHOUT CONTRAST TECHNIQUE: Contiguous axial images were obtained from the base of the skull through the vertex without intravenous contrast. COMPARISON:  None. FINDINGS: Significantly motion degraded scan. Brain: No evidence of parenchymal hemorrhage or extra-axial fluid collection. No mass lesion, mass effect, or midline shift. No CT evidence of acute infarction. Cerebral volume is age appropriate. No ventriculomegaly. Vascular: No hyperdense vessel or unexpected calcification. Skull: No evidence of calvarial fracture. Sinuses/Orbits: The visualized paranasal sinuses are essentially clear. Other:  The mastoid air cells are unopacified. IMPRESSION: Motion degraded scan. Negative head CT. No evidence of acute intracranial abnormality. Electronically Signed   By: Delbert PhenixJason A Poff M.D.   On: 02/02/2017 18:27    Procedures Procedures (including critical care time)  Medications Ordered in ED Medications  levETIRAcetam (KEPPRA) 1,000 mg in sodium chloride 0.9 % 100 mL IVPB (1,000 mg Intravenous New Bag/Given 02/02/17 2008)      Initial Impression / Assessment and Plan / ED Course  I have reviewed the triage vital signs and the nursing notes.  Pertinent labs & imaging results that were available during my care of the patient were reviewed by me and considered in my medical decision making (see chart for details).     Patient with possible seizure activity. There does somewhat could be an anxiety component but does have a video of one of the episodes. Potential seizure activity although when her arms extended the other one is flexed. Head CT and lab work reassuring. Benign neurologic exam here. Able to ambulate. Finger-nose intact bilaterally. No nystagmus. Will have follow-up with neurology. instructed on no driving. Discharge home.  Final Clinical Impressions(s) / ED Diagnoses   Final diagnoses:  Seizure (HCC)    New Prescriptions New Prescriptions   LEVETIRACETAM (KEPPRA) 500 MG TABLET    Take 1 tablet (500 mg total) by mouth 2 (two) times daily.     Benjiman CorePickering, Dayane Hillenburg, MD 02/02/17 Janett Billow2011    Benjiman CorePickering, Sherryann Frese, MD 02/26/17 [phone]

## 2017-02-02 NOTE — ED Notes (Signed)
Patient transported to CT 

## 2017-05-13 ENCOUNTER — Emergency Department
Admission: EM | Admit: 2017-05-13 | Discharge: 2017-05-13 | Disposition: A | Discharge status: Home / Self Care | Payer: Self-pay | Attending: Emergency Medicine | Admitting: Emergency Medicine

## 2017-05-13 ENCOUNTER — Encounter: Payer: Self-pay | Admitting: Emergency Medicine

## 2017-05-13 ENCOUNTER — Emergency Department: Payer: Self-pay

## 2017-05-13 DIAGNOSIS — Z79899 Other long term (current) drug therapy: Secondary | ICD-10-CM | POA: Insufficient documentation

## 2017-05-13 DIAGNOSIS — J45909 Unspecified asthma, uncomplicated: Secondary | ICD-10-CM | POA: Insufficient documentation

## 2017-05-13 DIAGNOSIS — R1030 Lower abdominal pain, unspecified: Secondary | ICD-10-CM | POA: Insufficient documentation

## 2017-05-13 DIAGNOSIS — F17210 Nicotine dependence, cigarettes, uncomplicated: Secondary | ICD-10-CM | POA: Insufficient documentation

## 2017-05-13 DIAGNOSIS — M546 Pain in thoracic spine: Secondary | ICD-10-CM | POA: Insufficient documentation

## 2017-05-13 DIAGNOSIS — Z91040 Latex allergy status: Secondary | ICD-10-CM | POA: Insufficient documentation

## 2017-05-13 DIAGNOSIS — G8929 Other chronic pain: Secondary | ICD-10-CM | POA: Insufficient documentation

## 2017-05-13 LAB — COMPREHENSIVE METABOLIC PANEL
ALT: 12 U/L — ABNORMAL LOW (ref 14–54)
ANION GAP: 12 (ref 5–15)
AST: 34 U/L (ref 15–41)
Albumin: 4.1 g/dL (ref 3.5–5.0)
Alkaline Phosphatase: 46 U/L (ref 38–126)
BUN: 10 mg/dL (ref 6–20)
CALCIUM: 9.1 mg/dL (ref 8.9–10.3)
CHLORIDE: 105 mmol/L (ref 101–111)
CO2: 19 mmol/L — AB (ref 22–32)
Creatinine, Ser: 0.9 mg/dL (ref 0.44–1.00)
GFR calc non Af Amer: >60 mL/min (ref >60)
Glucose, Bld: 129 mg/dL — ABNORMAL HIGH (ref 65–99)
Potassium: 3.4 mmol/L — ABNORMAL LOW (ref 3.5–5.1)
SODIUM: 136 mmol/L (ref 135–145)
Total Bilirubin: 0.7 mg/dL (ref 0.3–1.2)
Total Protein: 7.8 g/dL (ref 6.5–8.1)

## 2017-05-13 LAB — URINALYSIS, COMPLETE (UACMP) WITH MICROSCOPIC
BILIRUBIN URINE: NEGATIVE
Bacteria, UA: NONE SEEN
Glucose, UA: NEGATIVE mg/dL
Ketones, ur: NEGATIVE mg/dL
Leukocytes, UA: NEGATIVE
Nitrite: NEGATIVE
PH: 6 (ref 5.0–8.0)
Protein, ur: 30 mg/dL — AB
SPECIFIC GRAVITY, URINE: 1.024 (ref 1.005–1.030)

## 2017-05-13 LAB — LIPASE, BLOOD: LIPASE: 18 U/L (ref 11–51)

## 2017-05-13 LAB — CBC
HCT: 43.1 % (ref 35.0–47.0)
HEMOGLOBIN: 14.7 g/dL (ref 12.0–16.0)
MCH: 33.2 pg (ref 26.0–34.0)
MCHC: 34.2 g/dL (ref 32.0–36.0)
MCV: 96.9 fL (ref 80.0–100.0)
Platelets: 297 10*3/uL (ref 150–440)
RBC: 4.45 MIL/uL (ref 3.80–5.20)
RDW: 13 % (ref 11.5–14.5)
WBC: 12 10*3/uL — ABNORMAL HIGH (ref 3.6–11.0)

## 2017-05-13 LAB — PREGNANCY, URINE: PREG TEST UR: NEGATIVE

## 2017-05-13 MED ORDER — KETOROLAC TROMETHAMINE 30 MG/ML IJ SOLN
30.0000 mg | Freq: Once | INTRAMUSCULAR | Status: AC
  Administered 2017-05-13: 30 mg via INTRAVENOUS
  Filled 2017-05-13: qty 1

## 2017-05-13 MED ORDER — NAPROXEN 500 MG PO TABS
500.0000 mg | ORAL_TABLET | Freq: Two times a day (BID) | ORAL | 2 refills | Status: DC
Start: 2017-05-13 — End: 2018-06-08

## 2017-05-13 NOTE — ED Provider Notes (Signed)
[facility] Emergency Department Provider Note   ____________________________________________    I have reviewed the triage vital signs and the nursing notes.   HISTORY  Chief Complaint Back Pain     HPI Jenny Baldwin is a 31 y.o. female Who presents with complaints of moderate back pain which is crampy in nature. Patient complains of bilateral mid back pain over the last several days. She feels quite certain that this is related to a urinary tract infection although she denies dysuria. She wanted to get "checked before her kidneys were injured ". No fevers or chills. No nausea or vomiting. She hasn't taken anything for this. No history of kidney stones. Patient apparently recently diagnosed with seizures and is supposed to be taking Keppra but does not take it for unclear reasons   Past Medical History:  Diagnosis Date  . Acid reflux   . Asthma   . Bipolar 1 disorder (HCC)   . Chronic right hip pain   . Prediabetes   . PTSD (post-traumatic stress disorder)   . Recurrent UTI    with pregnancy  . SVD (spontaneous vaginal delivery)    x 5    Patient Active Problem List   Diagnosis Date Noted  . Major depressive disorder, recurrent, severe without psychotic features (HCC)   . PTSD (post-traumatic stress disorder) 04/22/2013  . Cesarean delivery, without mention of indication, delivered, with or without mention of antepartum condition 05/11/2012    Past Surgical History:  Procedure Laterality Date  . CESAREAN SECTION  05/08/2012   Procedure: CESAREAN SECTION;  Surgeon: Brock Bad, MD;  Location: [facility];  Service: Obstetrics;  Laterality: N/A;  Primary Cesarean Section Delivery Girl @ 1555, Apgars 9/9  . NO PAST SURGERIES      Prior to Admission medications   Medication Sig Start Date End Date Taking? Authorizing Provider  acetaminophen (TYLENOL) 325 MG tablet Take 650 mg by mouth every 6 (six) hours as needed for headache.   Yes  [provider]  levETIRAcetam (KEPPRA) 500 MG tablet Take 1 tablet (500 mg total) by mouth 2 (two) times daily. 02/02/17  Yes Benjiman Core, MD  fluticasone (FLONASE) 50 MCG/ACT nasal spray Place 1 spray into both nostrils daily. Patient not taking: Reported on 02/02/2017 02/25/16 02/24/17  Gayla Doss, MD  HYDROcodone-acetaminophen (NORCO/VICODIN) 5-325 MG tablet Take 1-2 tablets by mouth every 4 (four) hours as needed for moderate pain. Patient not taking: Reported on 02/02/2017 06/11/16   Loleta Rose, MD  ibuprofen (ADVIL,MOTRIN) 800 MG tablet Take 1 tablet (800 mg total) by mouth every 8 (eight) hours as needed. Patient not taking: Reported on 02/02/2017 06/04/16   Cuthriell, Delorise Royals, PA-C  naproxen (NAPROSYN) 500 MG tablet Take 1 tablet (500 mg total) by mouth 2 (two) times daily with a meal. 05/13/17   Jene Every, MD  traMADol (ULTRAM) 50 MG tablet Take 1 tablet (50 mg total) by mouth every 6 (six) hours as needed. Patient not taking: Reported on 02/02/2017 08/08/16 08/08/17  Joni Reining, PA-C  traZODone (DESYREL) 100 MG tablet Take 1 tablet (100 mg total) by mouth at bedtime. Patient not taking: Reported on 02/02/2017 01/17/15   Jimmy Footman, MD     Allergies Latex; Penicillins; Pineapple; and Vicodin [hydrocodone-acetaminophen]  Family History  Problem Relation Age of Onset  . Diabetes Mother   . Diabetes Maternal Aunt   . Diabetes Maternal Uncle   . Anesthesia problems Neg Hx  Social History Social History  Substance Use Topics  . Smoking status: Current Every Day Smoker    Packs/day: 1.00    Years: 14.00    Types: Cigarettes    Last attempt to quit: 11/08/2011  . Smokeless tobacco: Never Used  . Alcohol use No    Review of Systems  Constitutional: No fever/chills Eyes: No visual changes.  ENT: No sore throat. Cardiovascular: Denies chest pain. Respiratory: Denies shortness of breath. Gastrointestinal: No abdominal pain.  No  nausea, no vomiting.   Genitourinary: Negative for dysuria. Musculoskeletal: back pain as above Skin: Negative for rash. Neurological: Negative for headaches or weakness   ____________________________________________   PHYSICAL EXAM:  VITAL SIGNS: ED Triage Vitals  Enc Vitals Group     BP 05/13/17 1130 132/83     Pulse Rate 05/13/17 1130 92     Resp 05/13/17 1130 20     Temp 05/13/17 1130 98 F (36.7 C)     Temp Source 05/13/17 1130 Oral     SpO2 05/13/17 1130 100 %     Weight 05/13/17 1130 72.6 kg (160 lb)     Height 05/13/17 1130 1.575 m ( )     Head Circumference --      Peak Flow --      Pain Score 05/13/17 1131 10     Pain Loc --      Pain Edu? --      Excl. in GC? --     Constitutional: Alert and oriented. No acute distress. Pleasant and interactive Eyes: Conjunctivae are normal.  Head: Atraumatic. Nose: No congestion/rhinnorhea. Mouth/Throat: Mucous membranes are moist.    Cardiovascular: Normal rate, regular rhythm. Grossly normal heart sounds.  Good peripheral circulation. Respiratory: Normal respiratory effort.  No retractions. Lungs CTAB. Gastrointestinal: Soft and nontender. No distention.  No CVA tenderness. Genitourinary: deferred Musculoskeletal: back: No vertebral tenderness to palpation, mild paraspinal tenderness bilaterally mid back. Warm and well perfusedextremities Neurologic:  Normal speech and language. No gross focal neurologic deficits are appreciated. Normal Strength in all extremities Skin:  Skin is warm, dry and intact. No rash noted. Psychiatric: Mood and affect are normal. Speech and behavior are normal.  ____________________________________________   LABS (all labs ordered are listed, but only abnormal results are displayed)  Labs Reviewed  COMPREHENSIVE METABOLIC PANEL - Abnormal; Notable for the following:       Result Value   Potassium 3.4 (*)    CO2 19 (*)    Glucose, Bld 129 (*)    ALT 12 (*)    All other components  within normal limits  CBC - Abnormal; Notable for the following:    WBC 12.0 (*)    All other components within normal limits  URINALYSIS, COMPLETE (UACMP) WITH MICROSCOPIC - Abnormal; Notable for the following:    Color, Urine YELLOW (*)    APPearance CLEAR (*)    Hgb urine dipstick SMALL (*)    Protein, ur 30 (*)    Squamous Epithelial / LPF 0-5 (*)    All other components within normal limits  LIPASE, BLOOD  PREGNANCY, URINE   ____________________________________________  EKG  None ____________________________________________  RADIOLOGY  CT renal stone study nad, lung nodules primarily stable, 1 may have increased in size ____________________________________________   PROCEDURES  Procedure(s) performed: No    Critical Care performed: No ____________________________________________   INITIAL IMPRESSION / ASSESSMENT AND PLAN / ED COURSE  Pertinent labs & imaging results that were available during my care of the patient  were reviewed by me and considered in my medical decision making (see chart for details).  Patient had seizure-like activity upon arrival to the room, does not appear consistent with a seizure because after we moved her to bed she became alert and oriented with no confusion. Unclear if this is related to anxiety  Regarding her back patient's exam is overall reassuring, suspect muscular skeletal pain although ureterolithiasis is also possible. Lab work is overall reassuring, not consistent with UTI. We will check CT renal stone study. Patient treated with IV Toradol  CT scan unremarkable. Discussed need for follow up of lung nodules with patient. She understands the need for followup.    ____________________________________________   FINAL CLINICAL IMPRESSION(S) / ED DIAGNOSES  Final diagnoses:  Acute bilateral thoracic back pain      NEW MEDICATIONS STARTED DURING THIS VISIT:  New Prescriptions   NAPROXEN (NAPROSYN) 500 MG TABLET    Take  1 tablet (500 mg total) by mouth 2 (two) times daily with a meal.     Note:  This document was prepared using Dragon voice recognition software and may include unintentional dictation errors.    Jene Every, MD 05/13/17 [phone]

## 2017-05-13 NOTE — ED Notes (Signed)
This nurse went to ask MD for protocol orders, upon reentering triage room patient states "it's coming on".  When asked what was coming on she stated "the seizure".  Patient began mildly shaking, bilateral hands and arms contracting, and vocal.  This nurse stayed with patient for safety and called Sharee Pimple, RN for backup.  Patient was wheeled to room 9 where primary RN and MD met in room.  Seizure lasted approx. 1 minute.

## 2017-05-13 NOTE — ED Notes (Signed)
E-signature pad did not transfer signature over to computer. Pt signed. Pt  Verbalized understanding of DC instructions. Pt ambulatory to lobby to wait for husband.

## 2017-05-13 NOTE — ED Notes (Signed)
Patient ambulatory to toilet with steady gait. Specimen obtained and sent to lab. Patient repositioned in bed and cardiac monitor applied.

## 2017-05-13 NOTE — ED Triage Notes (Signed)
Pt to ED from home c/o bilateral mid back pain radiating to lower abd suddenly last night.  Pain is sharp and sensitive to touch.  Denies n/v/d, denies urinary symptoms.  States hx of kidney infections.  Also states productive cough at home.

## 2017-08-14 ENCOUNTER — Emergency Department: Payer: Self-pay

## 2017-08-14 ENCOUNTER — Emergency Department
Admission: EM | Admit: 2017-08-14 | Discharge: 2017-08-14 | Disposition: A | Discharge status: Home / Self Care | Payer: Self-pay | Attending: Student in an Organized Health Care Education/Training Program | Admitting: Student in an Organized Health Care Education/Training Program

## 2017-08-14 ENCOUNTER — Encounter: Payer: Self-pay | Admitting: Intensive Care

## 2017-08-14 DIAGNOSIS — R4782 Fluency disorder in conditions classified elsewhere: Secondary | ICD-10-CM | POA: Insufficient documentation

## 2017-08-14 DIAGNOSIS — F8081 Childhood onset fluency disorder: Secondary | ICD-10-CM

## 2017-08-14 DIAGNOSIS — Z79899 Other long term (current) drug therapy: Secondary | ICD-10-CM | POA: Insufficient documentation

## 2017-08-14 DIAGNOSIS — Z91040 Latex allergy status: Secondary | ICD-10-CM | POA: Insufficient documentation

## 2017-08-14 DIAGNOSIS — R569 Unspecified convulsions: Secondary | ICD-10-CM | POA: Insufficient documentation

## 2017-08-14 DIAGNOSIS — F17210 Nicotine dependence, cigarettes, uncomplicated: Secondary | ICD-10-CM | POA: Insufficient documentation

## 2017-08-14 DIAGNOSIS — J45909 Unspecified asthma, uncomplicated: Secondary | ICD-10-CM | POA: Insufficient documentation

## 2017-08-14 LAB — CBC WITH DIFFERENTIAL/PLATELET
BASOS ABS: 0.1 10*3/uL (ref 0–0.1)
BASOS PCT: 1 %
EOS PCT: 0 %
Eosinophils Absolute: 0 10*3/uL (ref 0–0.7)
HCT: 40.6 % (ref 35.0–47.0)
Hemoglobin: 13.9 g/dL (ref 12.0–16.0)
LYMPHS PCT: 20 %
Lymphs Abs: 2.3 10*3/uL (ref 1.0–3.6)
MCH: 33.1 pg (ref 26.0–34.0)
MCHC: 34.4 g/dL (ref 32.0–36.0)
MCV: 96.2 fL (ref 80.0–100.0)
MONO ABS: 0.7 10*3/uL (ref 0.2–0.9)
Monocytes Relative: 6 %
NEUTROS ABS: 8.3 10*3/uL — AB (ref 1.4–6.5)
Neutrophils Relative %: 73 %
PLATELETS: 305 10*3/uL (ref 150–440)
RBC: 4.22 MIL/uL (ref 3.80–5.20)
RDW: 12.7 % (ref 11.5–14.5)
WBC: 11.4 10*3/uL — AB (ref 3.6–11.0)

## 2017-08-14 LAB — COMPREHENSIVE METABOLIC PANEL
ALBUMIN: 4.1 g/dL (ref 3.5–5.0)
ALT: 15 U/L (ref 14–54)
AST: 23 U/L (ref 15–41)
Alkaline Phosphatase: 46 U/L (ref 38–126)
Anion gap: 8 (ref 5–15)
BUN: 12 mg/dL (ref 6–20)
CHLORIDE: 106 mmol/L (ref 101–111)
CO2: 20 mmol/L — AB (ref 22–32)
CREATININE: 0.8 mg/dL (ref 0.44–1.00)
Calcium: 8.9 mg/dL (ref 8.9–10.3)
GFR calc Af Amer: >60 mL/min (ref >60)
GFR calc non Af Amer: >60 mL/min (ref >60)
GLUCOSE: 110 mg/dL — AB (ref 65–99)
POTASSIUM: 3.5 mmol/L (ref 3.5–5.1)
SODIUM: 134 mmol/L — AB (ref 135–145)
Total Bilirubin: 0.5 mg/dL (ref 0.3–1.2)
Total Protein: 7.9 g/dL (ref 6.5–8.1)

## 2017-08-14 LAB — HCG, QUANTITATIVE, PREGNANCY

## 2017-08-14 LAB — GLUCOSE, CAPILLARY: GLUCOSE-CAPILLARY: 97 mg/dL (ref 65–99)

## 2017-08-14 MED ORDER — LORAZEPAM 1 MG PO TABS
1.0000 mg | ORAL_TABLET | Freq: Once | ORAL | Status: AC
  Administered 2017-08-14: 1 mg via ORAL
  Filled 2017-08-14: qty 1

## 2017-08-14 NOTE — ED Notes (Signed)
Pt laughing and speaking clearly at discharge, pt in NAD , VS stable, pt ambualtory with fmaily

## 2017-08-14 NOTE — ED Notes (Signed)
ED Provider at bedside. 

## 2017-08-14 NOTE — ED Provider Notes (Addendum)
[facility] Emergency Department Provider Note    First MD Initiated Contact with Patient 08/14/17 1751     (approximate)  I have reviewed the triage vital signs and the nursing notes.   HISTORY  Chief Complaint Seizures    HPI Jenny Baldwin is a 31 y.o. female with a history of bipolar 1 disorder, PTSD as well as self reported history of seizures not currently on any antiepileptic medications presents with 3 seizure episodes witnessed by brother why the patient was talking on the phone with her husband.  States that after the seizure she started having trouble forming words like a stuttering sensation.  No headache.  No numbness or tingling.  The stuttering is new but seems to be getting slowly better.  States that her seizure episodes are typically precipitated by stressful events.  Patient states that she does have significant family history of seizure disorder but has not followed up with neurology.  Denies any recent fevers, neck pain, headaches, nausea or vomiting.  Past Medical History:  Diagnosis Date  . Acid reflux   . Asthma   . Bipolar 1 disorder (HCC)   . Chronic right hip pain   . Prediabetes   . PTSD (post-traumatic stress disorder)   . Recurrent UTI    with pregnancy  . SVD (spontaneous vaginal delivery)    x 5   Family History  Problem Relation Age of Onset  . Diabetes Mother   . Diabetes Maternal Aunt   . Diabetes Maternal Uncle   . Anesthesia problems Neg Hx    Past Surgical History:  Procedure Laterality Date  . CESAREAN SECTION  05/08/2012   Procedure: CESAREAN SECTION;  Surgeon: Brock Badharles A [name], MD;  Location: [facility];  Service: Obstetrics;  Laterality: N/A;  Primary Cesarean Section Delivery Girl @ 1555, Apgars 9/9  . NO PAST SURGERIES     Patient Active Problem List   Diagnosis Date Noted  . Major depressive disorder, recurrent, severe without psychotic features (HCC)   . PTSD (post-traumatic stress disorder) 04/22/2013    . Cesarean delivery, without mention of indication, delivered, with or without mention of antepartum condition 05/11/2012      Prior to Admission medications   Medication Sig Start Date End Date Taking? Authorizing Provider  acetaminophen (TYLENOL) 325 MG tablet Take 650 mg by mouth every 6 (six) hours as needed for headache.    [provider]  fluticasone (FLONASE) 50 MCG/ACT nasal spray Place 1 spray into both nostrils daily. Patient not taking: Reported on 02/02/2017 02/25/16 02/24/17  Gayla DossGayle, Eryka A, MD  HYDROcodone-acetaminophen (NORCO/VICODIN) 5-325 MG tablet Take 1-2 tablets by mouth every 4 (four) hours as needed for moderate pain. Patient not taking: Reported on 02/02/2017 06/11/16   Loleta RoseForbach, Cory, MD  ibuprofen (ADVIL,MOTRIN) 800 MG tablet Take 1 tablet (800 mg total) by mouth every 8 (eight) hours as needed. Patient not taking: Reported on 02/02/2017 06/04/16   Cuthriell, Delorise RoyalsJonathan D, PA-C  levETIRAcetam (KEPPRA) 500 MG tablet Take 1 tablet (500 mg total) by mouth 2 (two) times daily. 02/02/17   Benjiman CorePickering, Nathan, MD  naproxen (NAPROSYN) 500 MG tablet Take 1 tablet (500 mg total) by mouth 2 (two) times daily with a meal. 05/13/17   Jene EveryKinner, Robert, MD  traZODone (DESYREL) 100 MG tablet Take 1 tablet (100 mg total) by mouth at bedtime. Patient not taking: Reported on 02/02/2017 01/17/15   Jimmy FootmanHernandez-Gonzalez, Andrea, MD    Allergies Latex; Penicillins; Pineapple; and Vicodin [hydrocodone-acetaminophen]  Social History Social History   Tobacco Use  . Smoking status: Current Every Day Smoker    Packs/day: 1.00    Years: 14.00    Pack years: 14.00    Types: Cigarettes    Last attempt to quit: 11/08/2011    Years since quitting: 5.7  . Smokeless tobacco: Never Used  Substance Use Topics  . Alcohol use: No  . Drug use: No    Review of Systems Patient denies headaches, rhinorrhea, blurry vision, numbness, shortness of breath, chest pain, edema, cough, abdominal pain,  nausea, vomiting, diarrhea, dysuria, fevers, rashes or hallucinations unless otherwise stated above in HPI. ____________________________________________   PHYSICAL EXAM:  VITAL SIGNS: Vitals:   08/14/17 1800 08/14/17 1830  BP:  114/83  Pulse: 86   Resp:    Temp:    SpO2: 99%     Constitutional: Alert and oriented. Well appearing and in no acute distress. Eyes: Conjunctivae are normal.  Head: Atraumatic. Nose: No congestion/rhinnorhea. Mouth/Throat: Mucous membranes are moist.   Neck: No stridor. Painless ROM.  Cardiovascular: Normal rate, regular rhythm. Grossly normal heart sounds.  Good peripheral circulation. Respiratory: Normal respiratory effort.  No retractions. Lungs CTAB. Gastrointestinal: Soft and nontender. No distention. No abdominal bruits. No CVA tenderness. Genitourinary: Musculoskeletal: No lower extremity tenderness nor edema.  No joint effusions. Neurologic:  CN- intact.  No facial droop, Normal FNF.  Normal heel to shin.  Sensation intact bilaterally. Normal language the patient with evidence of stuttering that she can overcome and is brief in nature.  Does seem slightly distractible. No gross focal neurologic deficits are appreciated. No gait instability. Skin:  Skin is warm, dry and intact. No rash noted. Psychiatric: Mood and affect are normal. Speech and behavior are normal.  ____________________________________________   LABS (all labs ordered are listed, but only abnormal results are displayed)  Results for orders placed or performed during the hospital encounter of 08/14/17 (from the past 24 hour(s))  CBC with Differential     Status: Abnormal   Collection Time: 08/14/17  3:28 PM  Result Value Ref Range   WBC 11.4 (H) 3.6 - 11.0 K/uL   RBC 4.22 3.80 - 5.20 MIL/uL   Hemoglobin 13.9 12.0 - 16.0 g/dL   HCT 16.1 09.6 - 04.5 %   MCV 96.2 80.0 - 100.0 fL   MCH 33.1 26.0 - 34.0 pg   MCHC 34.4 32.0 - 36.0 g/dL   RDW 40.9 81.1 - 91.4 %   Platelets 305  150 - 440 K/uL   Neutrophils Relative % 73 %   Neutro Abs 8.3 (H) 1.4 - 6.5 K/uL   Lymphocytes Relative 20 %   Lymphs Abs 2.3 1.0 - 3.6 K/uL   Monocytes Relative 6 %   Monocytes Absolute 0.7 0.2 - 0.9 K/uL   Eosinophils Relative 0 %   Eosinophils Absolute 0.0 0 - 0.7 K/uL   Basophils Relative 1 %   Basophils Absolute 0.1 0 - 0.1 K/uL  Comprehensive metabolic panel     Status: Abnormal   Collection Time: 08/14/17  3:28 PM  Result Value Ref Range   Sodium 134 (L) 135 - 145 mmol/L   Potassium 3.5 3.5 - 5.1 mmol/L   Chloride 106 101 - 111 mmol/L   CO2 20 (L) 22 - 32 mmol/L   Glucose, Bld 110 (H) 65 - 99 mg/dL   BUN 12 6 - 20 mg/dL   Creatinine, Ser 7.82 0.44 - 1.00 mg/dL   Calcium 8.9 8.9 - 95.6 mg/dL  Total Protein 7.9 6.5 - 8.1 g/dL   Albumin 4.1 3.5 - 5.0 g/dL   AST 23 15 - 41 U/L   ALT 15 14 - 54 U/L   Alkaline Phosphatase 46 38 - 126 U/L   Total Bilirubin 0.5 0.3 - 1.2 mg/dL   GFR calc non Af Amer >60 >60 mL/min   GFR calc Af Amer >60 >60 mL/min   Anion gap 8 5 - 15  hCG, quantitative, pregnancy     Status: None   Collection Time: 08/14/17  3:28 PM  Result Value Ref Range   hCG, Beta Chain, Quant, S <1 <5 mIU/mL  Glucose, capillary     Status: None   Collection Time: 08/14/17  3:32 PM  Result Value Ref Range   Glucose-Capillary 97 65 - 99 mg/dL   Comment 1 Notify RN    Comment 2 Document in Chart    ____________________________________________  ED ECG REPORT I, Willy EddyPatrick Tyana Butzer, the attending physician, personally viewed and interpreted this ECG.   Date: 08/14/2017  EKG Time: 15:21  Rate: 99  Rhythm: sinus  Axis: normal  Intervals:normal intervals, no wpw or brugada  ST&T Change: no stemi  ____________________________________________  RADIOLOGY  I personally reviewed all radiographic images ordered to evaluate for the above acute complaints and reviewed radiology reports and findings.  These findings were personally discussed with the patient.  Please see  medical record for radiology report.  ____________________________________________   PROCEDURES  Procedure(s) performed:  Procedures    Critical Care performed: no ____________________________________________   INITIAL IMPRESSION / ASSESSMENT AND PLAN / ED COURSE  Pertinent labs & imaging results that were available during my care of the patient were reviewed by me and considered in my medical decision making (see chart for details).  DDX: electrolyte abn, pregnancy, sah, iph, seizure, epilepsy, status  Jenny Baldwin is a 31 y.o. who presents to the ED with witnessed seizure-like episodes as described above.  Patient is currently well-appearing with no deficits other than what appears to be of an odd distractible stutter.  Patient without any significant acidosis therefore have lower suspicion for status or grand mall seizures.  CT head ordered out of triage is no evidence of acute abnormality.  Blood work is otherwise reassuring the patient is not pregnant.  She does describe seizure episodes occurring with stress will give dose of Ativan to see if this was for some relief.  Do not believe this represents status of her atypical seizure.  We will continue to monitor.  Clinical Course as of Aug 14 1899  Thu Aug 14, 2017  [id number] Patient reassessed.  Symptoms has resolved and she is speaking with more fluency.  States that she feels improved.  No focal deficits on neuro exam at this time.  Do not feel further diagnostic imaging is clinically indicated.  Discussed broad differential with patient and family at bedside including need for follow-up with neurology due to concern for epilepsy but given her intolerance of previous antiepileptic medications we will not start any initially.  I do not believe she requires admission to the hospital at this time.  Patient was able to tolerate PO and was able to ambulate with a steady gait.  Have discussed with the patient and available family all  diagnostics and treatments performed thus far and all questions were answered to the best of my ability. The patient demonstrates understanding and agreement with plan.   [PR]    Clinical Course User Index [PR] Willy EddyRobinson, Eschol Auxier,  MD     ____________________________________________   FINAL CLINICAL IMPRESSION(S) / ED DIAGNOSES  Final diagnoses:  Seizure-like activity (HCC)  Stuttering      NEW MEDICATIONS STARTED DURING THIS VISIT:  This SmartLink is deprecated. Use AVSMEDLIST instead to display the medication list for a patient.   Note:  This document was prepared using Dragon voice recognition software and may include unintentional dictation errors.    Willy Eddy, MD 08/14/17 1900    Willy Eddy, MD 08/14/17 Mikle Bosworth

## 2017-08-14 NOTE — ED Triage Notes (Signed)
Patient presents today with seizure X3 today that brother witnessed. Since patient is having trouble speaking and dizziness. Last seizure before these were 1 month ago. Denies taking medicine for seizures.

## 2017-08-14 NOTE — ED Notes (Signed)
Pt speech has improved, able to speak in more full sentences at this time. Pt states she feels better. MD made aware

## 2018-05-02 ENCOUNTER — Emergency Department: Payer: Self-pay

## 2018-05-02 ENCOUNTER — Emergency Department
Admission: EM | Admit: 2018-05-02 | Discharge: 2018-05-02 | Disposition: A | Discharge status: Home / Self Care | Payer: Self-pay | Attending: Emergency Medicine | Admitting: Emergency Medicine

## 2018-05-02 ENCOUNTER — Other Ambulatory Visit: Payer: Self-pay

## 2018-05-02 DIAGNOSIS — J45909 Unspecified asthma, uncomplicated: Secondary | ICD-10-CM | POA: Insufficient documentation

## 2018-05-02 DIAGNOSIS — F17210 Nicotine dependence, cigarettes, uncomplicated: Secondary | ICD-10-CM | POA: Insufficient documentation

## 2018-05-02 DIAGNOSIS — Y9301 Activity, walking, marching and hiking: Secondary | ICD-10-CM | POA: Insufficient documentation

## 2018-05-02 DIAGNOSIS — R7303 Prediabetes: Secondary | ICD-10-CM | POA: Insufficient documentation

## 2018-05-02 DIAGNOSIS — W010XXA Fall on same level from slipping, tripping and stumbling without subsequent striking against object, initial encounter: Secondary | ICD-10-CM | POA: Insufficient documentation

## 2018-05-02 DIAGNOSIS — Z91040 Latex allergy status: Secondary | ICD-10-CM | POA: Insufficient documentation

## 2018-05-02 DIAGNOSIS — Y92019 Unspecified place in single-family (private) house as the place of occurrence of the external cause: Secondary | ICD-10-CM | POA: Insufficient documentation

## 2018-05-02 DIAGNOSIS — S60221A Contusion of right hand, initial encounter: Secondary | ICD-10-CM | POA: Insufficient documentation

## 2018-05-02 DIAGNOSIS — Y998 Other external cause status: Secondary | ICD-10-CM | POA: Insufficient documentation

## 2018-05-02 MED ORDER — MELOXICAM 15 MG PO TABS: 15.0000 mg | ORAL_TABLET | Freq: Every day | ORAL | 1 refills | Status: AC

## 2018-05-02 NOTE — ED Notes (Signed)
Pt c/o R hand pain after falling, states decreased motion to 4th and 5th fingers of R hand, states hx of previous break that did not heal properly.

## 2018-05-02 NOTE — ED Triage Notes (Signed)
Patient reports playing with "kids" and tripped and fell.  Patient now with right hand pain.

## 2018-05-02 NOTE — ED Provider Notes (Signed)
[facility] Emergency Department Provider Note  ____________________________________________  Time seen: Approximately 9:10 PM  I have reviewed the triage vital signs and the nursing notes.   HISTORY  Chief Complaint Hand Injury    HPI Jenny Baldwin is a 32 y.o. female presents to the emergency department with right hand pain after patient tripped over 1 of her kids toys.  Patient localizes pain to lateral aspect of right hand.  Patient reports that she has had a prior right fifth metacarpal fracture in the past and is concerned for reinjury.  She denies numbness and tingling in the right hand.  Patient reports that she does not have full range of motion at her right fourth and fifth fingers at baseline.  No abrasions or lacerations.  No alleviating measures of been attempted.   Past Medical History:  Diagnosis Date  . Acid reflux   . Asthma   . Bipolar 1 disorder (HCC)   . Chronic right hip pain   . Prediabetes   . PTSD (post-traumatic stress disorder)   . Recurrent UTI    with pregnancy  . SVD (spontaneous vaginal delivery)    x 5    Patient Active Problem List   Diagnosis Date Noted  . Major depressive disorder, recurrent, severe without psychotic features (HCC)   . PTSD (post-traumatic stress disorder) 04/22/2013  . Cesarean delivery, without mention of indication, delivered, with or without mention of antepartum condition 05/11/2012    Past Surgical History:  Procedure Laterality Date  . CESAREAN SECTION  05/08/2012   Procedure: CESAREAN SECTION;  Surgeon: Brock Bad, MD;  Location: [facility];  Service: Obstetrics;  Laterality: N/A;  Primary Cesarean Section Delivery Girl @ 1555, Apgars 9/9  . NO PAST SURGERIES      Prior to Admission medications   Medication Sig Start Date End Date Taking? Authorizing Provider  acetaminophen (TYLENOL) 325 MG tablet Take 650 mg by mouth every 6 (six) hours as needed for headache.    [provider]  fluticasone (FLONASE) 50 MCG/ACT nasal spray Place 1 spray into both nostrils daily. Patient not taking: Reported on 02/02/2017 02/25/16 02/24/17  Gayla Doss, MD  HYDROcodone-acetaminophen (NORCO/VICODIN) 5-325 MG tablet Take 1-2 tablets by mouth every 4 (four) hours as needed for moderate pain. Patient not taking: Reported on 02/02/2017 06/11/16   Loleta Rose, MD  ibuprofen (ADVIL,MOTRIN) 800 MG tablet Take 1 tablet (800 mg total) by mouth every 8 (eight) hours as needed. Patient not taking: Reported on 02/02/2017 06/04/16   Cuthriell, Delorise Royals, PA-C  levETIRAcetam (KEPPRA) 500 MG tablet Take 1 tablet (500 mg total) by mouth 2 (two) times daily. 02/02/17   Benjiman Core, MD  meloxicam (MOBIC) 15 MG tablet Take 1 tablet (15 mg total) by mouth daily for 7 days. 05/02/18 05/09/18  Orvil Feil, PA-C  naproxen (NAPROSYN) 500 MG tablet Take 1 tablet (500 mg total) by mouth 2 (two) times daily with a meal. 05/13/17   Jene Every, MD  traZODone (DESYREL) 100 MG tablet Take 1 tablet (100 mg total) by mouth at bedtime. Patient not taking: Reported on 02/02/2017 01/17/15   Jimmy Footman, MD    Allergies Latex; Penicillins; Pineapple; and Vicodin [hydrocodone-acetaminophen]  Family History  Problem Relation Age of Onset  . Diabetes Mother   . Diabetes Maternal Aunt   . Diabetes Maternal Uncle   . Anesthesia problems Neg Hx     Social History Social History   Tobacco Use  .  Smoking status: Current Every Day Smoker    Packs/day: 1.00    Years: 14.00    Pack years: 14.00    Types: Cigarettes    Last attempt to quit: 11/08/2011    Years since quitting: 6.4  . Smokeless tobacco: Never Used  Substance Use Topics  . Alcohol use: No  . Drug use: No     Review of Systems  Constitutional: No fever/chills Eyes: No visual changes. No discharge ENT: No upper respiratory complaints. Cardiovascular: no chest pain. Respiratory: no cough. No SOB. Gastrointestinal: No  abdominal pain.  No nausea, no vomiting.  No diarrhea.  No constipation. Musculoskeletal: Patient has right hand pain.  Skin: Negative for rash, abrasions, lacerations, ecchymosis. Neurological: Negative for headaches, focal weakness or numbness.   ___________________________________  PHYSICAL EXAM:  VITAL SIGNS: ED Triage Vitals  Enc Vitals Group     BP 05/02/18 1932 (!) 138/96     Pulse Rate 05/02/18 1932 (!) 109     Resp 05/02/18 1932 18     Temp 05/02/18 1932 98.9 F (37.2 C)     Temp Source 05/02/18 1932 Oral     SpO2 05/02/18 1932 96 %     Weight 05/02/18 1931 190 lb (86.2 kg)     Height 05/02/18 1931 5\' 2"  (1.575 m)     Head Circumference --      Peak Flow --      Pain Score 05/02/18 1932 7     Pain Loc --      Pain Edu? --      Excl. in GC? --      Constitutional: Alert and oriented. Well appearing and in no acute distress. Eyes: Conjunctivae are normal. PERRL. EOMI. Head: Atraumatic. Cardiovascular: Normal rate, regular rhythm. Normal S1 and S2.  Good peripheral circulation. Respiratory: Normal respiratory effort without tachypnea or retractions. Lungs CTAB. Good air entry to the bases with no decreased or absent breath sounds. Gastrointestinal: Bowel sounds 4 quadrants. Soft and nontender to palpation. No guarding or rigidity. No palpable masses. No distention. No CVA tenderness. Musculoskeletal: Patient demonstrates full range of motion at the right wrist.  Patient is able to move her right fourth and fifth digits but does not perform full range of motion given baseline deficits.  Patient has tenderness with palpation of her right fourth and fifth metacarpals.  No gross deformities appreciated.  Palpable radial pulse, right. Neurologic:  Normal speech and language. No gross focal neurologic deficits are appreciated.  Skin:  Skin is warm, dry and intact. No rash noted. Psychiatric: Mood and affect are normal. Speech and behavior are normal. Patient exhibits  appropriate insight and judgement.   ____________________________________________   LABS (all labs ordered are listed, but only abnormal results are displayed)  Labs Reviewed - No data to display ____________________________________________  EKG   ____________________________________________  RADIOLOGY I personally viewed and evaluated these images as part of my medical decision making, as well as reviewing the written report by the radiologist.  Dg Hand Complete Right  Result Date: 05/02/2018 CLINICAL DATA:  Pain after injury. EXAM: RIGHT HAND - COMPLETE 3+ VIEW COMPARISON:  None. FINDINGS: Old healed fracture at the base of the fifth metacarpal bone. No acute fracture or dislocation seen. Soft tissues about the RIGHT hand are unremarkable. IMPRESSION: No acute findings. Electronically Signed   By: Bary RichardStan  Maynard M.D.   On: 05/02/2018 20:02    ____________________________________________    PROCEDURES  Procedure(s) performed:    Procedures  Medications - No data to display   ____________________________________________   INITIAL IMPRESSION / ASSESSMENT AND PLAN / ED COURSE  Pertinent labs & imaging results that were available during my care of the patient were reviewed by me and considered in my medical decision making (see chart for details).  Review of the [place] CSRS was performed in accordance of the [facility] prior to dispensing any controlled drugs.      Assessment and plan Right hand pain Patient presents to the emergency department with right hand pain after a fall.  X-ray examination revealed no acute fractures or bony abnormalities.  No flexor or extensor tendon deficits were appreciated with testing.  Contusion is likely.  Patient was placed in a wrist splint in the emergency department.  She was given a referral to orthopedics and discharged with meloxicam.  Vital signs are reassuring throughout emergency department course.  All patient questions were  answered.    ____________________________________________  FINAL CLINICAL IMPRESSION(S) / ED DIAGNOSES  Final diagnoses:  Contusion of right hand, initial encounter      NEW MEDICATIONS STARTED DURING THIS VISIT:  ED Discharge Orders         Ordered    meloxicam (MOBIC) 15 MG tablet  Daily     05/02/18 2107              This chart was dictated using voice recognition software/Dragon. Despite best efforts to proofread, errors can occur which can change the meaning. Any change was purely unintentional.    Gasper Lloyd 05/02/18 2116    Phineas Semen, MD 05/02/18 2159

## 2018-05-02 NOTE — ED Notes (Signed)
NAD noted at time of D/C. Pt left with D/C instructions prior to this RN reviewing them as thsi RN left D/C instructions at bedside while registration was in room. Unable to obtain E-sig for D/C.

## 2018-06-04 ENCOUNTER — Encounter: Payer: Self-pay | Admitting: *Deleted

## 2018-06-04 ENCOUNTER — Other Ambulatory Visit: Payer: Self-pay

## 2018-06-04 ENCOUNTER — Emergency Department
Admission: EM | Admit: 2018-06-04 | Discharge: 2018-06-04 | Disposition: A | Discharge status: Home / Self Care | Payer: Self-pay | Attending: Emergency Medicine | Admitting: Emergency Medicine

## 2018-06-04 DIAGNOSIS — M542 Cervicalgia: Secondary | ICD-10-CM

## 2018-06-04 DIAGNOSIS — M546 Pain in thoracic spine: Secondary | ICD-10-CM | POA: Insufficient documentation

## 2018-06-04 DIAGNOSIS — F17210 Nicotine dependence, cigarettes, uncomplicated: Secondary | ICD-10-CM | POA: Insufficient documentation

## 2018-06-04 DIAGNOSIS — Z79899 Other long term (current) drug therapy: Secondary | ICD-10-CM | POA: Insufficient documentation

## 2018-06-04 DIAGNOSIS — J45909 Unspecified asthma, uncomplicated: Secondary | ICD-10-CM | POA: Insufficient documentation

## 2018-06-04 DIAGNOSIS — S29019A Strain of muscle and tendon of unspecified wall of thorax, initial encounter: Secondary | ICD-10-CM

## 2018-06-04 MED ORDER — CYCLOBENZAPRINE HCL 5 MG PO TABS: 5.0000 mg | ORAL_TABLET | Freq: Three times a day (TID) | ORAL | 0 refills | Status: DC | PRN

## 2018-06-04 MED ORDER — PREDNISONE 10 MG PO TABS: 10.0000 mg | ORAL_TABLET | Freq: Two times a day (BID) | ORAL | 0 refills | Status: DC

## 2018-06-04 MED ORDER — CYCLOBENZAPRINE HCL 10 MG PO TABS
10.0000 mg | ORAL_TABLET | Freq: Once | ORAL | Status: AC
  Administered 2018-06-04: 10 mg via ORAL
  Filled 2018-06-04: qty 1

## 2018-06-04 MED ORDER — KETOROLAC TROMETHAMINE 30 MG/ML IJ SOLN
30.0000 mg | Freq: Once | INTRAMUSCULAR | Status: AC
  Administered 2018-06-04: 30 mg via INTRAMUSCULAR
  Filled 2018-06-04: qty 1

## 2018-06-04 NOTE — ED Provider Notes (Signed)
[facility] Emergency Department Provider Note ____________________________________________  Time seen: 26  I have reviewed the triage vital signs and the nursing notes.  HISTORY  Chief Complaint  Neck Pain and Back Pain  HPI Jenny Baldwin is a 32 y.o. female presents to the ED for 2 days of left-sided neck and upper back pain.  Patient describes a she woke up with tightness to the upper anterior shoulder as well as some referral into the left lateral neck.  She denies any recent injury, accident, or trauma.  She is denied any distal paresthesias, grip changes, or upper extremity swelling.  She is also done any chest pain, shortness of breath, or weakness.  Patient still Tylenol wants for her symptom management in the last 48 hours.  She denies any other symptoms at this time.  Past Medical History:  Diagnosis Date  . Acid reflux   . Asthma   . Bipolar 1 disorder (HCC)   . Chronic right hip pain   . Prediabetes   . PTSD (post-traumatic stress disorder)   . Recurrent UTI    with pregnancy  . SVD (spontaneous vaginal delivery)    x 5    Patient Active Problem List   Diagnosis Date Noted  . Major depressive disorder, recurrent, severe without psychotic features (HCC)   . PTSD (post-traumatic stress disorder) 04/22/2013  . Cesarean delivery, without mention of indication, delivered, with or without mention of antepartum condition 05/11/2012    Past Surgical History:  Procedure Laterality Date  . CESAREAN SECTION  05/08/2012   Procedure: CESAREAN SECTION;  Surgeon: Brock Bad, MD;  Location: [facility];  Service: Obstetrics;  Laterality: N/A;  Primary Cesarean Section Delivery Girl @ 1555, Apgars 9/9  . NO PAST SURGERIES      Prior to Admission medications   Medication Sig Start Date End Date Taking? Authorizing Provider  acetaminophen (TYLENOL) 325 MG tablet Take 650 mg by mouth every 6 (six) hours as needed for headache.    [provider]   cyclobenzaprine (FLEXERIL) 5 MG tablet Take 1 tablet (5 mg total) by mouth 3 (three) times daily as needed for muscle spasms. 06/04/18   Betul Brisky, Charlesetta Ivory, PA-C  fluticasone (FLONASE) 50 MCG/ACT nasal spray Place 1 spray into both nostrils daily. Patient not taking: Reported on 02/02/2017 02/25/16 02/24/17  Gayla Doss, MD  HYDROcodone-acetaminophen (NORCO/VICODIN) 5-325 MG tablet Take 1-2 tablets by mouth every 4 (four) hours as needed for moderate pain. Patient not taking: Reported on 02/02/2017 06/11/16   Loleta Rose, MD  ibuprofen (ADVIL,MOTRIN) 800 MG tablet Take 1 tablet (800 mg total) by mouth every 8 (eight) hours as needed. Patient not taking: Reported on 02/02/2017 06/04/16   Cuthriell, Delorise Royals, PA-C  levETIRAcetam (KEPPRA) 500 MG tablet Take 1 tablet (500 mg total) by mouth 2 (two) times daily. 02/02/17   Benjiman Core, MD  naproxen (NAPROSYN) 500 MG tablet Take 1 tablet (500 mg total) by mouth 2 (two) times daily with a meal. 05/13/17   Jene Every, MD  predniSONE (DELTASONE) 10 MG tablet Take 1 tablet (10 mg total) by mouth 2 (two) times daily with a meal. 06/04/18   Garnell Begeman, Charlesetta Ivory, PA-C  traZODone (DESYREL) 100 MG tablet Take 1 tablet (100 mg total) by mouth at bedtime. Patient not taking: Reported on 02/02/2017 01/17/15   Jimmy Footman, MD    Allergies Latex; Penicillins; Ibuprofen; Pineapple; and Vicodin [hydrocodone-acetaminophen]  Family History  Problem Relation Age of Onset  .  Diabetes Mother   . Diabetes Maternal Aunt   . Diabetes Maternal Uncle   . Anesthesia problems Neg Hx     Social History Social History   Tobacco Use  . Smoking status: Current Every Day Smoker    Packs/day: 1.00    Years: 14.00    Pack years: 14.00    Types: Cigarettes    Last attempt to quit: 11/08/2011    Years since quitting: 6.5  . Smokeless tobacco: Never Used  Substance Use Topics  . Alcohol use: No  . Drug use: No    Review of  Systems  Constitutional: Negative for fever. Eyes: Negative for visual changes. ENT: Negative for sore throat. Cardiovascular: Negative for chest pain. Respiratory: Negative for shortness of breath. Gastrointestinal: Negative for abdominal pain, vomiting and diarrhea. Genitourinary: Negative for dysuria. Musculoskeletal: Positive for neck, upper back, and left shoulder pain. Skin: Negative for rash. Neurological: Negative for headaches, focal weakness or numbness. ____________________________________________  PHYSICAL EXAM:  VITAL SIGNS: ED Triage Vitals  Enc Vitals Group     BP 06/04/18 1647 137/90     Pulse Rate 06/04/18 1647 100     Resp 06/04/18 1647 20     Temp 06/04/18 1647 98.3 F (36.8 C)     Temp Source 06/04/18 1647 Oral     SpO2 06/04/18 1647 99 %     Weight 06/04/18 1648 190 lb (86.2 kg)     Height 06/04/18 1648 5\' 2"  (1.575 m)     Head Circumference --      Peak Flow --      Pain Score 06/04/18 1648 8     Pain Loc --      Pain Edu? --      Excl. in GC? --     Constitutional: Alert and oriented. Well appearing and in no distress. Head: Normocephalic and atraumatic. Eyes: Conjunctivae are normal. Normal extraocular movements Neck: Supple. Normal ROM without crepitus. No distracting midline tenderness is appreciated. Hematological/Lymphatic/Immunological: No cervical lymphadenopathy. Cardiovascular: Normal rate, regular rhythm. Normal distal pulses. Respiratory: Normal respiratory effort. No wheezes/rales/rhonchi. Musculoskeletal: Normal spinal alignment without midline tenderness, spasm, deformity, or step-off.  Normal upper extremity resistance testing without rotator cuff deficit.  Patient is mildly tender to palpation to the upper trapezius musculature on the left.  Nontender with normal range of motion in all extremities.  Neurologic: Cranial nerves II through XII grossly intact.  Normal UE DTRs bilaterally.  Normal gait without ataxia. Normal speech and  language. No gross focal neurologic deficits are appreciated. Skin:  Skin is warm, dry and intact. No rash noted. ____________________________________________  PROCEDURES  Procedures Toradol 30 mg IM Flexeril 10 mg PO ____________________________________________  INITIAL IMPRESSION / ASSESSMENT AND PLAN / ED COURSE  Patient with ED evaluation of a 2 to 3-day complaint of left-sided musculoskeletal neck pain.  Patient denies any preceding injury, accident, or trauma.  Her exam is benign and reassuring at this time, as it shows that is likely due to myalgias and strain.  She reports the inability to take over-the-counter NSAIDs secondary to previous GI bleed.  She will be discharged with a prescription for Flexeril as well as prednisone to take as directed.  She is encouraged to follow-up with the local community clinic for ongoing symptom management. ____________________________________________  FINAL CLINICAL IMPRESSION(S) / ED DIAGNOSES  Final diagnoses:  Neck pain  Thoracic myofascial strain, initial encounter      Lissa Hoard, PA-C 06/04/18 1931    [place], [place],  MD 06/06/18 [id number]

## 2018-06-04 NOTE — ED Notes (Signed)
Pt states neck pain, back pain x 3-4 days not relieved with tylenol. States she is allergic to ibuprofen (gi upset). Denies injury

## 2018-06-04 NOTE — Discharge Instructions (Addendum)
Your exam is essentially normal. You have muscle strain and spasm, likely from sleeping wrong. Take the prescription meds as directed. Apply ice and moist heat for muscle pain. Follow-up with your provider or [facility] as needed.

## 2018-06-04 NOTE — ED Triage Notes (Signed)
Pt has pain in neck and back for 2 days   No known injury.  Pt taking tylenol without relief.  Pt alert

## 2018-06-08 ENCOUNTER — Encounter: Payer: Self-pay | Admitting: Emergency Medicine

## 2018-06-08 ENCOUNTER — Emergency Department
Admission: EM | Admit: 2018-06-08 | Discharge: 2018-06-08 | Disposition: A | Discharge status: Home / Self Care | Payer: Self-pay | Attending: Emergency Medicine | Admitting: Emergency Medicine

## 2018-06-08 ENCOUNTER — Emergency Department: Payer: Self-pay

## 2018-06-08 DIAGNOSIS — R0781 Pleurodynia: Secondary | ICD-10-CM

## 2018-06-08 DIAGNOSIS — M791 Myalgia, unspecified site: Secondary | ICD-10-CM | POA: Insufficient documentation

## 2018-06-08 DIAGNOSIS — R0789 Other chest pain: Secondary | ICD-10-CM | POA: Insufficient documentation

## 2018-06-08 DIAGNOSIS — F17210 Nicotine dependence, cigarettes, uncomplicated: Secondary | ICD-10-CM | POA: Insufficient documentation

## 2018-06-08 DIAGNOSIS — J45909 Unspecified asthma, uncomplicated: Secondary | ICD-10-CM | POA: Insufficient documentation

## 2018-06-08 DIAGNOSIS — Z79899 Other long term (current) drug therapy: Secondary | ICD-10-CM | POA: Insufficient documentation

## 2018-06-08 DIAGNOSIS — M7918 Myalgia, other site: Secondary | ICD-10-CM

## 2018-06-08 MED ORDER — OXYCODONE-ACETAMINOPHEN 5-325 MG PO TABS: 1.0000 | ORAL_TABLET | Freq: Four times a day (QID) | ORAL | 0 refills | Status: DC | PRN

## 2018-06-08 MED ORDER — ORPHENADRINE CITRATE 30 MG/ML IJ SOLN
60.0000 mg | Freq: Two times a day (BID) | INTRAMUSCULAR | Status: DC
  Administered 2018-06-08: 60 mg via INTRAMUSCULAR
  Filled 2018-06-08: qty 2

## 2018-06-08 NOTE — ED Triage Notes (Signed)
Pt reports a couple of days ago she injured her left shoulder and was given some medication. Pt reports now her left ribs are hurting and she felt like she heard a cracking in her ribs. Pt reports pain is worse when she coughs or moves her left arm.

## 2018-06-08 NOTE — Discharge Instructions (Addendum)
Follow-up with 1 the clinics listed on your discharge papers.  Call today and make arrangements to become a new patient and establish a primary care provider.  Continue taking medication as already prescribed with Flexeril 5 mg 3 times a day and continue taking prednisone as directed.  Also oxycodone every 6 hours as needed for severe pain.  You may use ice or heat to your muscles as needed for comfort.

## 2018-06-08 NOTE — ED Provider Notes (Signed)
[facility] Emergency Department Provider Note  ____________________________________________   First MD Initiated Contact with Patient 06/08/18 [phone]     (approximate)  I have reviewed the triage vital signs and the nursing notes.   HISTORY  Chief Complaint Rib Injury   HPI Jenny Baldwin is a 32 y.o. female presents to the ED with complaint of left rib pain.  Patient states that she felt and heard some "cracking" in her left ribs and has continued to have pain.  She reports that the pain is worse if she coughs or moves her left arm.  She denies any recent injury to her ribs.  She was seen in the ED for complaint of her left shoulder that began having pain 2 days ago.  Patient states that she woke up with the pain in her left shoulder.  She presently has a prescription for Flexeril 5 mg 3 times daily and prednisone.  Currently she rates her pain as 10 out of 10.   Past Medical History:  Diagnosis Date  . Acid reflux   . Asthma   . Bipolar 1 disorder (HCC)   . Chronic right hip pain   . Prediabetes   . PTSD (post-traumatic stress disorder)   . Recurrent UTI    with pregnancy  . SVD (spontaneous vaginal delivery)    x 5    Patient Active Problem List   Diagnosis Date Noted  . Major depressive disorder, recurrent, severe without psychotic features (HCC)   . PTSD (post-traumatic stress disorder) 04/22/2013  . Cesarean delivery, without mention of indication, delivered, with or without mention of antepartum condition 05/11/2012    Past Surgical History:  Procedure Laterality Date  . CESAREAN SECTION  05/08/2012   Procedure: CESAREAN SECTION;  Surgeon: Brock Bad, MD;  Location: [facility];  Service: Obstetrics;  Laterality: N/A;  Primary Cesarean Section Delivery Girl @ 1555, Apgars 9/9  . NO PAST SURGERIES      Prior to Admission medications   Medication Sig Start Date End Date Taking? Authorizing Provider  acetaminophen (TYLENOL) 325 MG tablet  Take 650 mg by mouth every 6 (six) hours as needed for headache.    [provider]  cyclobenzaprine (FLEXERIL) 5 MG tablet Take 1 tablet (5 mg total) by mouth 3 (three) times daily as needed for muscle spasms. 06/04/18   Menshew, Charlesetta Ivory, PA-C  levETIRAcetam (KEPPRA) 500 MG tablet Take 1 tablet (500 mg total) by mouth 2 (two) times daily. 02/02/17   Benjiman Core, MD  oxyCODONE-acetaminophen (PERCOCET) 5-325 MG tablet Take 1 tablet by mouth every 6 (six) hours as needed for severe pain. 06/08/18   Tommi Rumps, PA-C  predniSONE (DELTASONE) 10 MG tablet Take 1 tablet (10 mg total) by mouth 2 (two) times daily with a meal. 06/04/18   Menshew, Charlesetta Ivory, PA-C    Allergies Latex; Penicillins; Ibuprofen; Pineapple; and Vicodin [hydrocodone-acetaminophen]  Family History  Problem Relation Age of Onset  . Diabetes Mother   . Diabetes Maternal Aunt   . Diabetes Maternal Uncle   . Anesthesia problems Neg Hx     Social History Social History   Tobacco Use  . Smoking status: Current Every Day Smoker    Packs/day: 1.00    Years: 14.00    Pack years: 14.00    Types: Cigarettes    Last attempt to quit: 11/08/2011    Years since quitting: 6.5  . Smokeless tobacco: Never Used  Substance Use Topics  .  Alcohol use: No  . Drug use: No    Review of Systems Constitutional: No fever/chills Eyes: No visual changes. Cardiovascular: Denies chest pain. Respiratory: Denies shortness of breath. Gastrointestinal: No abdominal pain.  No nausea, no vomiting.  Musculoskeletal: Negative for back pain.  Positive for left rib pain.  Recent left shoulder pain. Skin: Negative for rash or injury. Neurological: Negative for headaches, focal weakness or numbness. ____________________________________________   PHYSICAL EXAM:  VITAL SIGNS: ED Triage Vitals  Enc Vitals Group     BP 06/08/18 0918 (!) 152/85     Pulse Rate 06/08/18 0918 81     Resp 06/08/18 0918 20     Temp  06/08/18 0918 98.2 F (36.8 C)     Temp Source 06/08/18 0918 Oral     SpO2 06/08/18 0918 98 %     Weight 06/08/18 0919 190 lb (86.2 kg)     Height 06/08/18 0919 5\' 2"  (1.575 m)     Head Circumference --      Peak Flow --      Pain Score 06/08/18 0919 10     Pain Loc --      Pain Edu? --      Excl. in GC? --    Constitutional: Alert and oriented. Well appearing and in no acute distress. Eyes: Conjunctivae are normal. PERRL. EOMI. Head: Atraumatic. Nose: No congestion/rhinnorhea. Neck: No stridor.   Cardiovascular: Normal rate, regular rhythm. Grossly normal heart sounds.  Good peripheral circulation. Respiratory: Normal respiratory effort.  No retractions. Lungs CTAB. Gastrointestinal: Soft and nontender. No distention.  No CVA tenderness. Musculoskeletal: On examination of the left lateral ribs there is tenderness to palpation.  There is no soft tissue injury or edema present.  No crepitus on palpation.  Patient is tender in left lateral rib area and is just mildly able to tolerate auscultation of her lungs secondary to pain from auscultation.  No ecchymosis or abrasions were noted.  Range of motion of the left shoulder increases rib pain. Neurologic:  Normal speech and language. No gross focal neurologic deficits are appreciated. No gait instability. Skin:  Skin is warm, dry and intact.  As noted above. Psychiatric: Mood and affect are normal. Speech and behavior are normal.  ____________________________________________   LABS (all labs ordered are listed, but only abnormal results are displayed)  Labs Reviewed - No data to display  RADIOLOGY  ED MD interpretation:   Left rib x-ray negative for acute fracture.  Official radiology report(s): Dg Ribs Unilateral W/chest Left  Result Date: 06/08/2018 CLINICAL DATA:  Pain EXAM: LEFT RIBS AND CHEST - 3+ VIEW COMPARISON:  Chest radiograph July 24, 2014 FINDINGS: Frontal chest as well as oblique and cone-down rib images  obtained. There is slight left base atelectasis. Lungs elsewhere are clear. Heart size and pulmonary vascularity are normal. No adenopathy. No pleural effusion or pneumothorax evident.  No rib fracture. IMPRESSION: Slight left base atelectasis. No edema or consolidation. No evident rib fracture. Electronically Signed   By: Bretta Bang III M.D.   On: 06/08/2018 10:00    ____________________________________________   PROCEDURES  Procedure(s) performed: None  Procedures  Critical Care performed: No  ____________________________________________   INITIAL IMPRESSION / ASSESSMENT AND PLAN / ED COURSE  As part of my medical decision making, I reviewed the following data within the electronic MEDICAL RECORD NUMBER Notes from prior ED visits and [place] Controlled Substance Database  Patient presents to the ED with complaint of left lateral rib pain.  She  was seen 2 to 3 days ago for left shoulder pain that began shortly after she woke that morning.  There is been no history of injury.  Patient denies any difficulty breathing.  She states that when she heard a cracking sound she immediately had a sharp pain that prevented her from moving.  Currently movement increases her pain.  Patient has been taking Flexeril 5 mg without any relief of her muscle spasms.  X-ray was reassuring and patient was made aware that there was no rib fracture.  She was given an injection of Norflex 60 mg IM while in the department as she has not taken any Flexeril since last evening.  She was also to continue taking prednisone until finished and a prescription for Norco was sent to her pharmacy.  She is encouraged to follow-up with 1 of the clinics listed on her discharge papers as she needs to establish a PCP.  ____________________________________________   FINAL CLINICAL IMPRESSION(S) / ED DIAGNOSES  Final diagnoses:  Rib pain on left side  Musculoskeletal pain     ED Discharge Orders         Ordered     oxyCODONE-acetaminophen (PERCOCET) 5-325 MG tablet  Every 6 hours PRN     06/08/18 1118           Note:  This document was prepared using Dragon voice recognition software and may include unintentional dictation errors.    Tommi Rumps, PA-C 06/08/18 1140    Minna Antis, MD 06/08/18 [id number]

## 2018-11-14 ENCOUNTER — Emergency Department
Admission: EM | Admit: 2018-11-14 | Discharge: 2018-11-14 | Disposition: A | Discharge status: Home / Self Care | Payer: Self-pay | Attending: Emergency Medicine | Admitting: Emergency Medicine

## 2018-11-14 ENCOUNTER — Other Ambulatory Visit: Payer: Self-pay

## 2018-11-14 ENCOUNTER — Emergency Department: Payer: Self-pay

## 2018-11-14 DIAGNOSIS — R0602 Shortness of breath: Secondary | ICD-10-CM | POA: Insufficient documentation

## 2018-11-14 DIAGNOSIS — Z880 Allergy status to penicillin: Secondary | ICD-10-CM | POA: Insufficient documentation

## 2018-11-14 DIAGNOSIS — R1031 Right lower quadrant pain: Secondary | ICD-10-CM | POA: Insufficient documentation

## 2018-11-14 DIAGNOSIS — J45909 Unspecified asthma, uncomplicated: Secondary | ICD-10-CM | POA: Insufficient documentation

## 2018-11-14 DIAGNOSIS — R109 Unspecified abdominal pain: Secondary | ICD-10-CM

## 2018-11-14 DIAGNOSIS — Z91040 Latex allergy status: Secondary | ICD-10-CM | POA: Insufficient documentation

## 2018-11-14 DIAGNOSIS — R350 Frequency of micturition: Secondary | ICD-10-CM | POA: Insufficient documentation

## 2018-11-14 DIAGNOSIS — R300 Dysuria: Secondary | ICD-10-CM | POA: Insufficient documentation

## 2018-11-14 DIAGNOSIS — F17210 Nicotine dependence, cigarettes, uncomplicated: Secondary | ICD-10-CM | POA: Insufficient documentation

## 2018-11-14 DIAGNOSIS — Z79899 Other long term (current) drug therapy: Secondary | ICD-10-CM | POA: Insufficient documentation

## 2018-11-14 LAB — COMPREHENSIVE METABOLIC PANEL
ALT: 13 U/L (ref 0–44)
AST: 14 U/L — ABNORMAL LOW (ref 15–41)
Albumin: 4.1 g/dL (ref 3.5–5.0)
Alkaline Phosphatase: 37 U/L — ABNORMAL LOW (ref 38–126)
Anion gap: 9 (ref 5–15)
BILIRUBIN TOTAL: 0.2 mg/dL — AB (ref 0.3–1.2)
BUN: 9 mg/dL (ref 6–20)
CALCIUM: 8.8 mg/dL — AB (ref 8.9–10.3)
CO2: 20 mmol/L — ABNORMAL LOW (ref 22–32)
Chloride: 110 mmol/L (ref 98–111)
Creatinine, Ser: 0.86 mg/dL (ref 0.44–1.00)
GFR calc Af Amer: >60 mL/min (ref >60)
GFR calc non Af Amer: >60 mL/min (ref >60)
Glucose, Bld: 110 mg/dL — ABNORMAL HIGH (ref 70–99)
Potassium: 3.4 mmol/L — ABNORMAL LOW (ref 3.5–5.1)
Sodium: 139 mmol/L (ref 135–145)
TOTAL PROTEIN: 7.5 g/dL (ref 6.5–8.1)

## 2018-11-14 LAB — URINALYSIS, COMPLETE (UACMP) WITH MICROSCOPIC
Bilirubin Urine: NEGATIVE
Glucose, UA: NEGATIVE mg/dL
Hgb urine dipstick: NEGATIVE
Ketones, ur: NEGATIVE mg/dL
Nitrite: NEGATIVE
Protein, ur: NEGATIVE mg/dL
SPECIFIC GRAVITY, URINE: 1.028 (ref 1.005–1.030)
pH: 5 (ref 5.0–8.0)

## 2018-11-14 LAB — CBC WITH DIFFERENTIAL/PLATELET
Abs Immature Granulocytes: 0.02 10*3/uL (ref 0.00–0.07)
Basophils Absolute: 0.1 10*3/uL (ref 0.0–0.1)
Basophils Relative: 1 %
Eosinophils Absolute: 0.2 10*3/uL (ref 0.0–0.5)
Eosinophils Relative: 2 %
HEMATOCRIT: 39.1 % (ref 36.0–46.0)
Hemoglobin: 13.5 g/dL (ref 12.0–15.0)
Immature Granulocytes: 0 %
LYMPHS ABS: 4.1 10*3/uL — AB (ref 0.7–4.0)
Lymphocytes Relative: 41 %
MCH: 33.3 pg (ref 26.0–34.0)
MCHC: 34.5 g/dL (ref 30.0–36.0)
MCV: 96.3 fL (ref 80.0–100.0)
MONO ABS: 0.9 10*3/uL (ref 0.1–1.0)
MONOS PCT: 9 %
Neutro Abs: 4.7 10*3/uL (ref 1.7–7.7)
Neutrophils Relative %: 47 %
Platelets: 309 10*3/uL (ref 150–400)
RBC: 4.06 MIL/uL (ref 3.87–5.11)
RDW: 12 % (ref 11.5–15.5)
WBC: 10.1 10*3/uL (ref 4.0–10.5)
nRBC: 0 % (ref 0.0–0.2)

## 2018-11-14 LAB — LIPASE, BLOOD: Lipase: 23 U/L (ref 11–51)

## 2018-11-14 LAB — PREGNANCY, URINE: Preg Test, Ur: NEGATIVE

## 2018-11-14 MED ORDER — KETOROLAC TROMETHAMINE 30 MG/ML IJ SOLN
15.0000 mg | Freq: Once | INTRAMUSCULAR | Status: AC
  Administered 2018-11-14: 15 mg via INTRAVENOUS
  Filled 2018-11-14: qty 1

## 2018-11-14 MED ORDER — SODIUM CHLORIDE 0.9 % IV BOLUS
1000.0000 mL | Freq: Once | INTRAVENOUS | Status: AC
Start: 2018-11-14 — End: 2018-11-14
  Administered 2018-11-14: 1000 mL via INTRAVENOUS

## 2018-11-14 NOTE — ED Notes (Signed)
Pt presents with c/o fever 100.3, abd pain, back pain and dysuria; ambulatory with steady gait; talking in complete coherent sentences; pt adds weakness, fatigue, pain with deep inspiration and headache;

## 2018-11-14 NOTE — ED Notes (Signed)
Patient transported to CT 

## 2018-11-14 NOTE — ED Notes (Signed)
Pt back from CT

## 2018-11-14 NOTE — ED Provider Notes (Signed)
[facility] Emergency Department Provider Note  Time seen: 7:36 PM  I have reviewed the triage vital signs and the nursing notes.   HISTORY  Chief Complaint Abdominal pain, fever   HPI Jenny Baldwin is a 33 y.o. female with a past medical history of bipolar, PTSD, asthma, presents to the emergency department for right flank pain and fever.  According to the patient for the past 2 to 3 days she has been experiencing some mild discomfort when urinating, today she developed significant right flank pain which she describes as right back pain radiating to her right abdomen, right mid abdomen and right lower quadrant.  Patient describes it more as a sharp pain.  Patient states she also had a fever at home today.  No vomiting.  No diarrhea.  Patient states a mild cough, but states a history of asthma.  No recent travel.  Patient states she has been staying at home over the past 3 weeks.   Past Medical History:  Diagnosis Date  . Acid reflux   . Asthma   . Bipolar 1 disorder (HCC)   . Chronic right hip pain   . Prediabetes   . PTSD (post-traumatic stress disorder)   . Recurrent UTI    with pregnancy  . SVD (spontaneous vaginal delivery)    x 5    Patient Active Problem List   Diagnosis Date Noted  . Major depressive disorder, recurrent, severe without psychotic features (HCC)   . PTSD (post-traumatic stress disorder) 04/22/2013  . Cesarean delivery, without mention of indication, delivered, with or without mention of antepartum condition 05/11/2012    Past Surgical History:  Procedure Laterality Date  . CESAREAN SECTION  05/08/2012   Procedure: CESAREAN SECTION;  Surgeon: Brock Bad, MD;  Location: [facility];  Service: Obstetrics;  Laterality: N/A;  Primary Cesarean Section Delivery Girl @ 1555, Apgars 9/9  . NO PAST SURGERIES      Prior to Admission medications   Medication Sig Start Date End Date Taking? Authorizing Provider  acetaminophen (TYLENOL)  325 MG tablet Take 650 mg by mouth every 6 (six) hours as needed for headache.    [provider]  cyclobenzaprine (FLEXERIL) 5 MG tablet Take 1 tablet (5 mg total) by mouth 3 (three) times daily as needed for muscle spasms. 06/04/18   Menshew, Charlesetta Ivory, PA-C  levETIRAcetam (KEPPRA) 500 MG tablet Take 1 tablet (500 mg total) by mouth 2 (two) times daily. 02/02/17   Benjiman Core, MD  oxyCODONE-acetaminophen (PERCOCET) 5-325 MG tablet Take 1 tablet by mouth every 6 (six) hours as needed for severe pain. 06/08/18   Tommi Rumps, PA-C  predniSONE (DELTASONE) 10 MG tablet Take 1 tablet (10 mg total) by mouth 2 (two) times daily with a meal. 06/04/18   Menshew, Charlesetta Ivory, PA-C    Allergies  Allergen Reactions  . Latex Itching and Rash  . Penicillins Swelling    Has patient had a PCN reaction causing immediate rash, facial/tongue/throat swelling, SOB or lightheadedness with hypotension: Unknown Has patient had a PCN reaction causing severe rash involving mucus membranes or skin necrosis: Unknown Has patient had a PCN reaction that required hospitalization: Unknown Has patient had a PCN reaction occurring within the last 10 years: Unknown If all of the above answers are "NO", then may proceed with Cephalosporin use.   . Ibuprofen Nausea Only  . Pineapple Itching  . Vicodin [Hydrocodone-Acetaminophen] Rash    Family History  Problem Relation  Age of Onset  . Diabetes Mother   . Diabetes Maternal Aunt   . Diabetes Maternal Uncle   . Anesthesia problems Neg Hx     Social History Social History   Tobacco Use  . Smoking status: Current Every Day Smoker    Packs/day: 1.00    Years: 14.00    Pack years: 14.00    Types: Cigarettes    Last attempt to quit: 11/08/2011    Years since quitting: 7.0  . Smokeless tobacco: Never Used  Substance Use Topics  . Alcohol use: No  . Drug use: No    Review of Systems Constitutional: Positive for fever  today Cardiovascular: Negative for chest pain. Respiratory: Negative for shortness of breath. Gastrointestinal: Positive for right flank pain. Genitourinary: Positive for urinary frequency/discomfort Musculoskeletal: Negative for musculoskeletal complaints Skin: Negative for skin complaints  Neurological: Negative for headache All other ROS negative  ____________________________________________   PHYSICAL EXAM:  Constitutional: Alert and oriented. Well appearing and in no distress. Eyes: Normal exam ENT   Head: Normocephalic and atraumatic.   Mouth/Throat: Mucous membranes are moist. Cardiovascular: Normal rate, regular rhythm.  Respiratory: Normal respiratory effort without tachypnea nor retractions. Breath sounds are clear Gastrointestinal: Soft, mild right mid and lower abdominal tenderness to palpation.  No significant right upper quadrant tenderness.  Patient has moderate CVA tenderness on the right side only.  No distention.  No rebound or guarding. Musculoskeletal: Nontender with normal range of motion in all extremities. Neurologic:  Normal speech and language. No gross focal neurologic deficits Skin:  Skin is warm, dry and intact.  Psychiatric: Mood and affect are normal.   ____________________________________________    RADIOLOGY  CT pending  ____________________________________________   INITIAL IMPRESSION / ASSESSMENT AND PLAN / ED COURSE  Pertinent labs & imaging results that were available during my care of the patient were reviewed by me and considered in my medical decision making (see chart for details).  Patient presents emergency department for moderate to severe right flank pain described as sharp starting today as well as a fever today.  She also states an additional 2 to 3 days of dysuria and frequency.  Differential this time would include urinary tract infection, pyelonephritis, ureterolithiasis, appendicitis, less likely cholecystitis.  We  will check labs, proceed with CT imaging, treat pain and dose IV fluids.  Extremely low concern for chronic infection.  Patient's lab work is overall reassuring thus far.  Normal white blood cell count.  Normal chemistry.  Negative lipase.  Patient's urinalysis is overall reassuring as well, she does have rare bacteria, we will send a urine culture as a precaution.  Patient denies any discharge although states several days ago she did have a small amount of vaginal bleeding.  States she has not been sexually active in greater than 6 years.  Patient's pregnancy test and CT scan are pending.  Patient signed out to oncoming provider.   Jenny Baldwin was evaluated in Emergency Department on 11/14/2018 for the symptoms described in the history of present illness. She was evaluated in the context of the global COVID-19 pandemic, which necessitated consideration that the patient might be at risk for infection with the SARS-CoV-2 virus that causes COVID-19. Institutional protocols and algorithms that pertain to the evaluation of patients at risk for COVID-19 are in a state of rapid change based on information released by regulatory bodies including the CDC and federal and state organizations. These policies and algorithms were followed during the patient's care in the  ED.   ____________________________________________   FINAL CLINICAL IMPRESSION(S) / ED DIAGNOSES  Right flank pain    Minna Antis, MD 11/14/18 2039

## 2018-11-14 NOTE — ED Triage Notes (Signed)
Patient came to ED from home. Patient woke up with SOB and abd pain on right and lower side that is sharp when breathing in. Patient states having fever today. 101.7 and took 4 reg strength tylenol.

## 2018-11-16 LAB — URINE CULTURE

## 2018-12-31 IMAGING — CT CT HEAD W/O CM
4 series · 15 of 47 positions shown, 17 images · non-contrast
Comparison: None.

CLINICAL DATA: Seizures.  Headache.

EXAM:
CT HEAD WITHOUT CONTRAST
TECHNIQUE: Contiguous axial images were obtained from the base of the skull
through the vertex without intravenous contrast.

[Series 3: head without · axial · non-contrast · 0.42mm/px · z∈[-65,+35]mm · 7 of 28 slices shown, 9 images]
[im 4/28  brain]
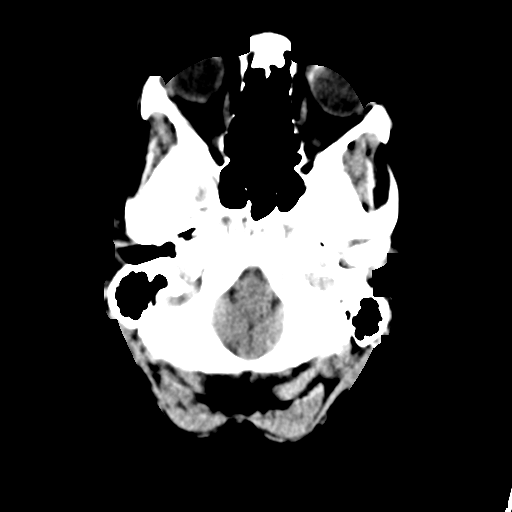
[im 4/28  bone]
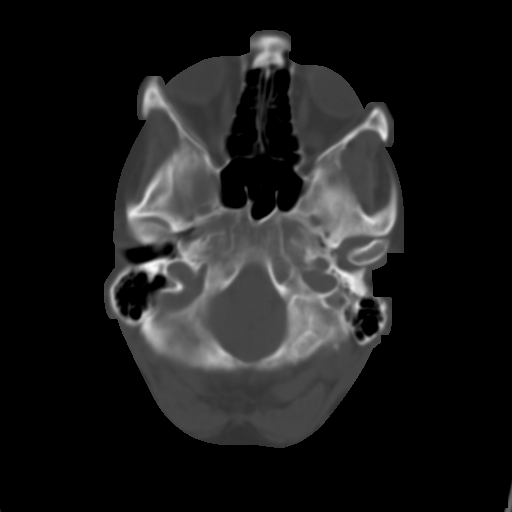
[im 7/28  brain]
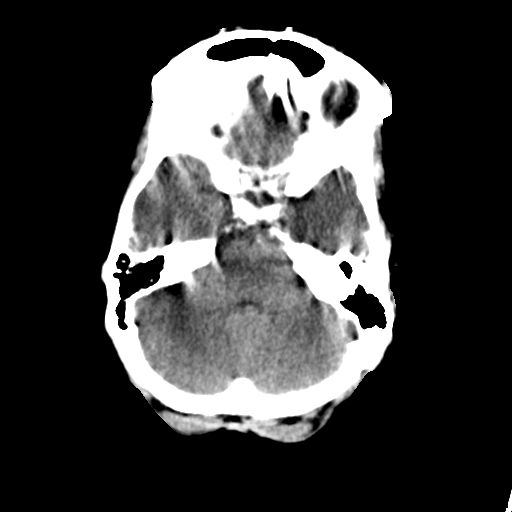
[im 11/28  brain]
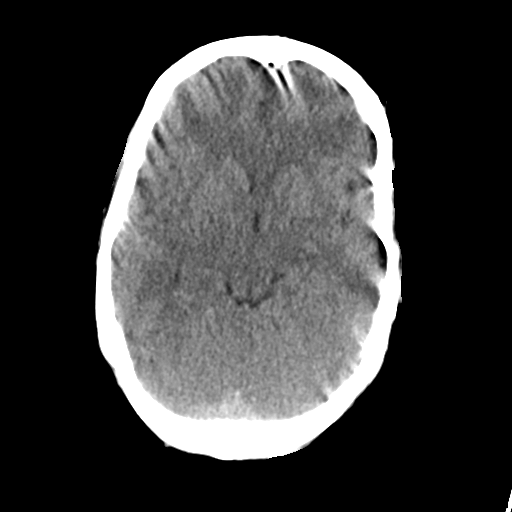
[im 14/28  brain]
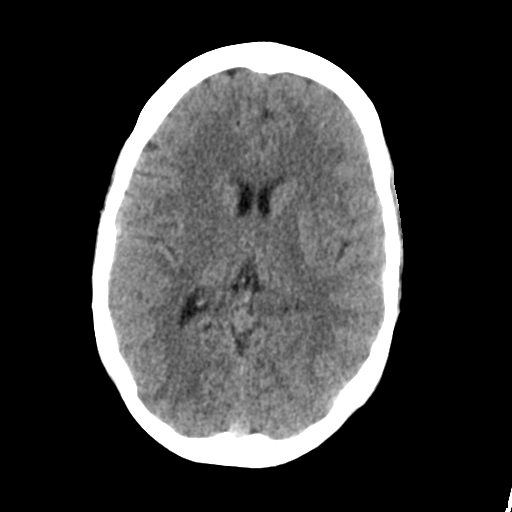
[im 17/28  brain]
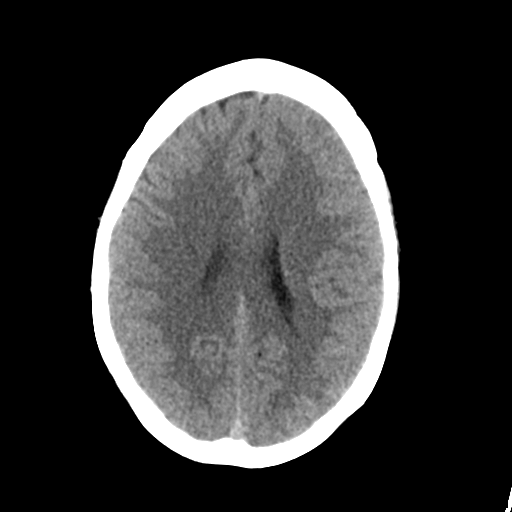
[im 17/28  bone]
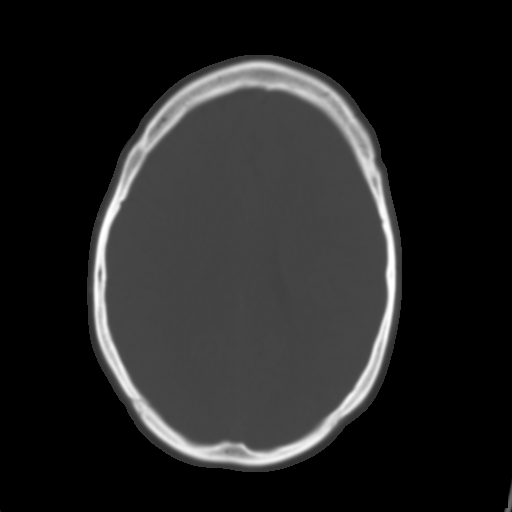
[im 21/28  brain]
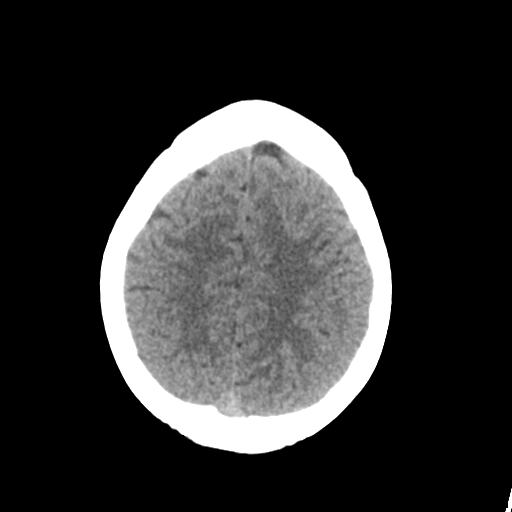
[im 24/28  brain]
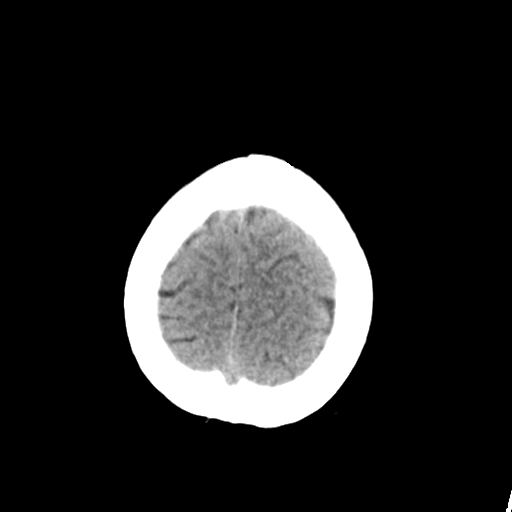

[Series 4: head bone · axial · 0.42mm/px · z∈[-68,-54]mm · 2 of 70 slices shown]
[im 7/70  bone]
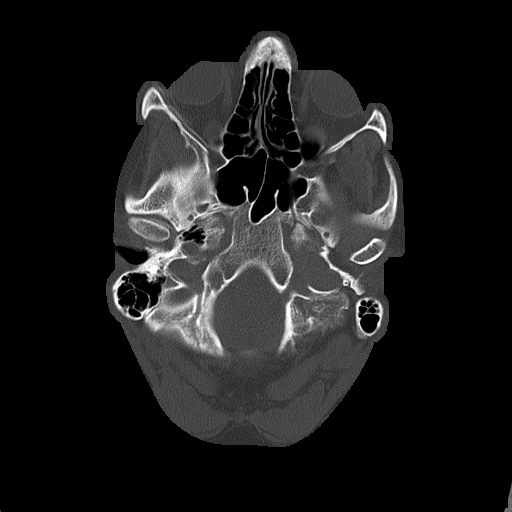
[im 14/70  bone]
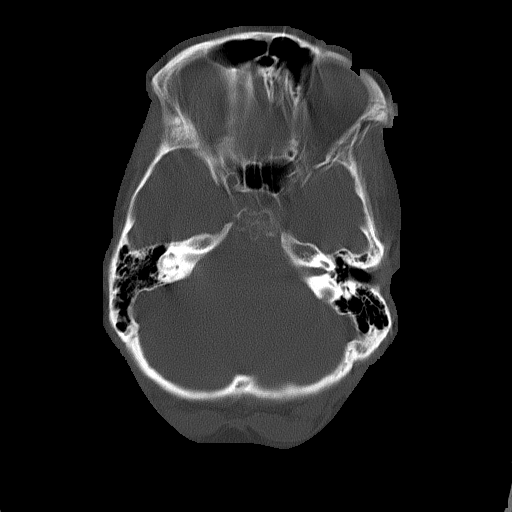

[Series 5: head without cor · coronal · non-contrast · 0.27mm/px · 3 of 67 slices shown]
[im 23/67  brain]
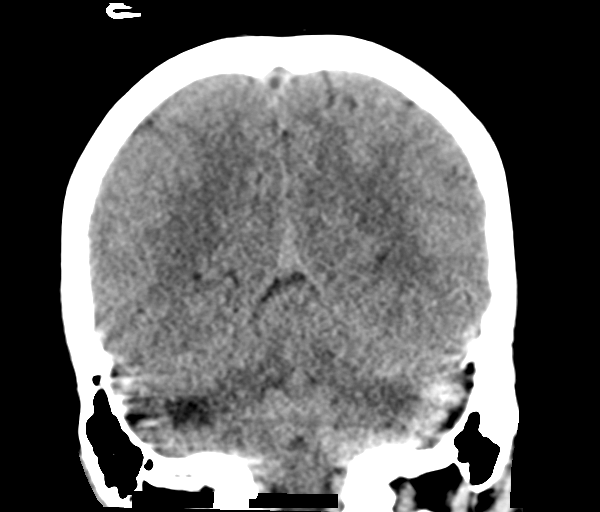
[im 30/67  brain]
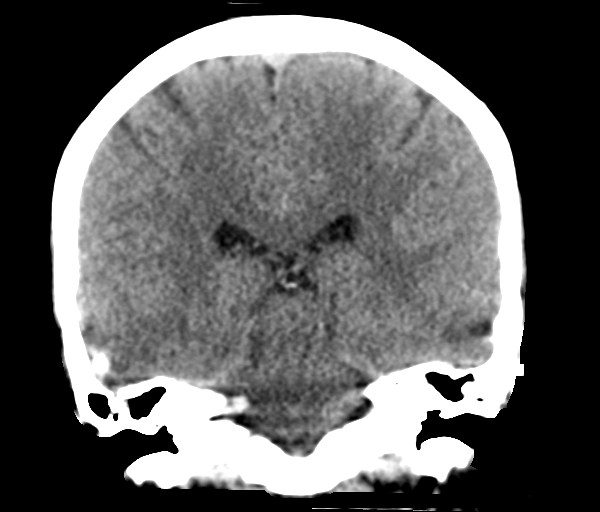
[im 37/67  brain]
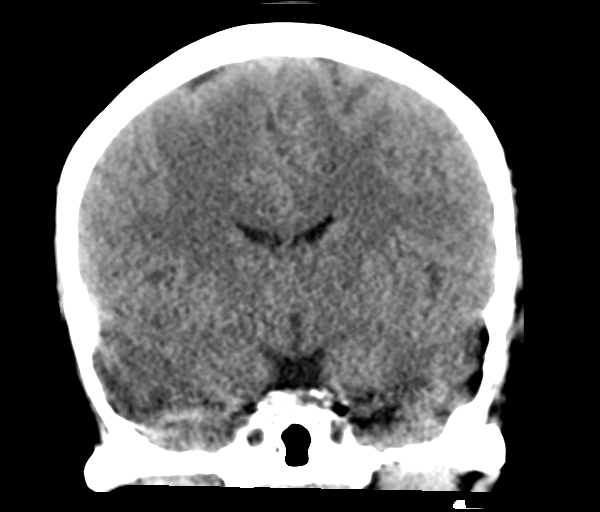

[Series 6: head without sag · sagittal · non-contrast · 0.27mm/px · 3 of 53 slices shown]
[im 18/53  brain]
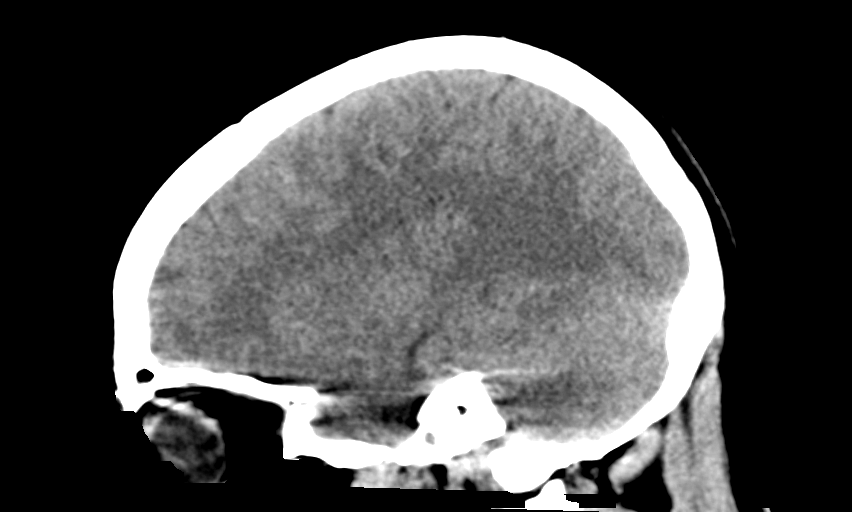
[im 27/53  brain]
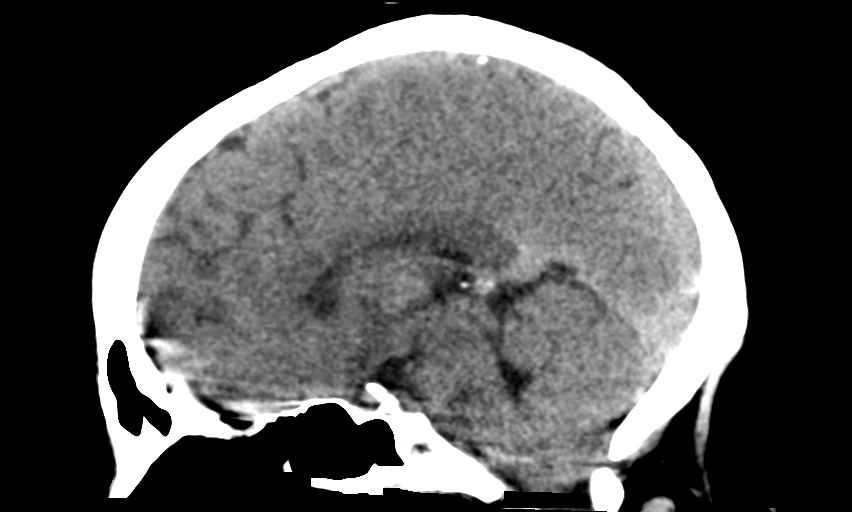
[im 35/53  brain]
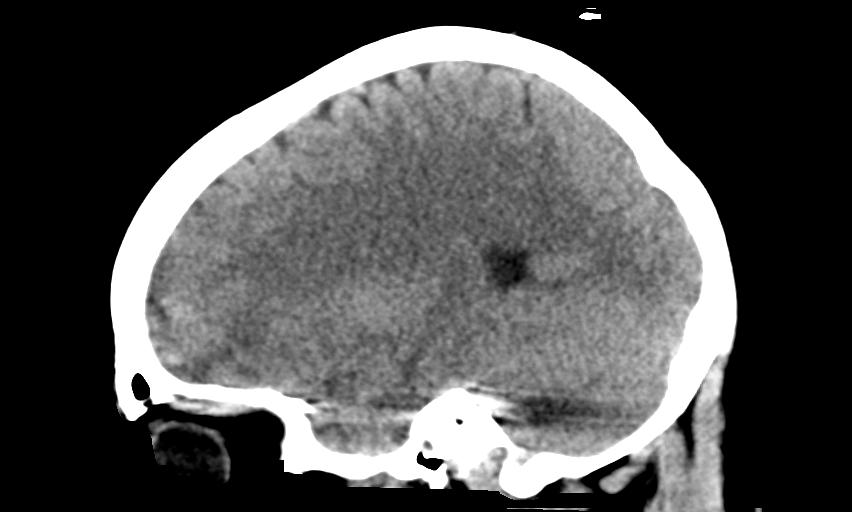

[15 of 47 positions shown; findings below may reference images not displayed]

FINDINGS: Significantly motion degraded scan.

Brain: No evidence of parenchymal hemorrhage or extra-axial fluid
collection. No mass lesion, mass effect, or midline shift. No CT
evidence of acute infarction. Cerebral volume is age appropriate. No
ventriculomegaly.

Vascular: No hyperdense vessel or unexpected calcification.

Skull: No evidence of calvarial fracture.

Sinuses/Orbits: The visualized paranasal sinuses are essentially
clear.

Other:  The mastoid air cells are unopacified.
IMPRESSION: Motion degraded scan. Negative head CT. No evidence of acute
intracranial abnormality.

## 2019-01-21 ENCOUNTER — Other Ambulatory Visit: Payer: Self-pay

## 2019-01-21 DIAGNOSIS — Z5321 Procedure and treatment not carried out due to patient leaving prior to being seen by health care provider: Secondary | ICD-10-CM | POA: Insufficient documentation

## 2019-01-21 DIAGNOSIS — R6884 Jaw pain: Secondary | ICD-10-CM | POA: Insufficient documentation

## 2019-01-21 LAB — CBC WITH DIFFERENTIAL/PLATELET
Abs Immature Granulocytes: 0.02 10*3/uL (ref 0.00–0.07)
Basophils Absolute: 0 10*3/uL (ref 0.0–0.1)
Basophils Relative: 0 %
Eosinophils Absolute: 0.2 10*3/uL (ref 0.0–0.5)
Eosinophils Relative: 2 %
HCT: 39.2 % (ref 36.0–46.0)
Hemoglobin: 13.6 g/dL (ref 12.0–15.0)
Immature Granulocytes: 0 %
Lymphocytes Relative: 38 %
Lymphs Abs: 4 10*3/uL (ref 0.7–4.0)
MCH: 33.6 pg (ref 26.0–34.0)
MCHC: 34.7 g/dL (ref 30.0–36.0)
MCV: 96.8 fL (ref 80.0–100.0)
Monocytes Absolute: 0.8 10*3/uL (ref 0.1–1.0)
Monocytes Relative: 8 %
Neutro Abs: 5.3 10*3/uL (ref 1.7–7.7)
Neutrophils Relative %: 52 %
Platelets: 357 10*3/uL (ref 150–400)
RBC: 4.05 MIL/uL (ref 3.87–5.11)
RDW: 12 % (ref 11.5–15.5)
WBC: 10.4 10*3/uL (ref 4.0–10.5)
nRBC: 0 % (ref 0.0–0.2)

## 2019-01-21 LAB — COMPREHENSIVE METABOLIC PANEL
ALT: 13 U/L (ref 0–44)
AST: 14 U/L — ABNORMAL LOW (ref 15–41)
Albumin: 4.3 g/dL (ref 3.5–5.0)
Alkaline Phosphatase: 39 U/L (ref 38–126)
Anion gap: 11 (ref 5–15)
BUN: 14 mg/dL (ref 6–20)
CO2: 22 mmol/L (ref 22–32)
Calcium: 9.3 mg/dL (ref 8.9–10.3)
Chloride: 106 mmol/L (ref 98–111)
Creatinine, Ser: 0.76 mg/dL (ref 0.44–1.00)
GFR calc Af Amer: >60 mL/min (ref >60)
GFR calc non Af Amer: >60 mL/min (ref >60)
Glucose, Bld: 112 mg/dL — ABNORMAL HIGH (ref 70–99)
Potassium: 3.9 mmol/L (ref 3.5–5.1)
Sodium: 139 mmol/L (ref 135–145)
Total Bilirubin: 0.4 mg/dL (ref 0.3–1.2)
Total Protein: 7.9 g/dL (ref 6.5–8.1)

## 2019-01-21 NOTE — ED Triage Notes (Signed)
Patient c/o left jaw pain and swelling X 2 weeks. Patient reports pain radiates up to ear and is worse with eating. Patient reports pain started after seizure, patient is unsure if she lost consciousness or injured herself during the seizure.

## 2019-01-22 ENCOUNTER — Emergency Department
Admission: EM | Admit: 2019-01-22 | Discharge: 2019-01-22 | Disposition: A | Discharge status: Home / Self Care | Payer: Self-pay | Attending: Emergency Medicine | Admitting: Emergency Medicine

## 2019-01-23 ENCOUNTER — Emergency Department: Payer: Self-pay

## 2019-01-23 ENCOUNTER — Emergency Department
Admission: EM | Admit: 2019-01-23 | Discharge: 2019-01-23 | Disposition: A | Discharge status: Home / Self Care | Payer: Self-pay | Attending: Emergency Medicine | Admitting: Emergency Medicine

## 2019-01-23 ENCOUNTER — Other Ambulatory Visit: Payer: Self-pay

## 2019-01-23 DIAGNOSIS — R7303 Prediabetes: Secondary | ICD-10-CM | POA: Insufficient documentation

## 2019-01-23 DIAGNOSIS — F17210 Nicotine dependence, cigarettes, uncomplicated: Secondary | ICD-10-CM | POA: Insufficient documentation

## 2019-01-23 DIAGNOSIS — M26621 Arthralgia of right temporomandibular joint: Secondary | ICD-10-CM | POA: Insufficient documentation

## 2019-01-23 DIAGNOSIS — J45909 Unspecified asthma, uncomplicated: Secondary | ICD-10-CM | POA: Insufficient documentation

## 2019-01-23 DIAGNOSIS — Z91040 Latex allergy status: Secondary | ICD-10-CM | POA: Insufficient documentation

## 2019-01-23 MED ORDER — BACLOFEN 10 MG PO TABS: 10.0000 mg | ORAL_TABLET | Freq: Three times a day (TID) | ORAL | 0 refills | Status: DC | PRN

## 2019-01-23 MED ORDER — MELOXICAM 15 MG PO TABS: 15.0000 mg | ORAL_TABLET | Freq: Every day | ORAL | 0 refills | Status: DC

## 2019-01-23 NOTE — ED Triage Notes (Signed)
Pt states she had a seizure a week ago and since having right jaw pain and thinks she may have injured it during the seizure.

## 2019-01-23 NOTE — ED Notes (Signed)
See triage note.  Presents with pain to right side of jaw  States she developed pain about 1 week ago   States she is unsure if she hit her face during a sz ..area is tender to touch with min swelling

## 2019-01-23 NOTE — ED Provider Notes (Signed)
[facility] Emergency Department Provider Note ____________________________________________  Time seen: Approximately 11:42 AM  I have reviewed the triage vital signs and the nursing notes.   HISTORY  Chief Complaint Jaw Pain    HPI ORVA RILES is a 33 y.o. female who presents to the emergency department for evaluation and treatment of right-sided jaw pain that is been present for 1 week after seizure.  Patient states that her significant other reports that she opens her mouth and clenches her teeth when she is seizing.  She does not believe that she struck her jaw on anything during the seizure, but she is unsure.  Pain increases significantly with speaking or attempting to open her mouth.  Past Medical History:  Diagnosis Date  . Acid reflux   . Asthma   . Bipolar 1 disorder ([place])   . Chronic right hip pain   . Prediabetes   . PTSD (post-traumatic stress disorder)   . Recurrent UTI    with pregnancy  . SVD (spontaneous vaginal delivery)    x 5    Patient Active Problem List   Diagnosis Date Noted  . Major depressive disorder, recurrent, severe without psychotic features ([place])   . PTSD (post-traumatic stress disorder) 04/22/2013  . Cesarean delivery, without mention of indication, delivered, with or without mention of antepartum condition 05/11/2012    Past Surgical History:  Procedure Laterality Date  . CESAREAN SECTION  05/08/2012   Procedure: CESAREAN SECTION;  Surgeon: Shelly Bombard, MD;  Location: [facility];  Service: Obstetrics;  Laterality: N/A;  Primary Cesarean Section Delivery Girl @ 3790, Apgars 9/9  . NO PAST SURGERIES      Prior to Admission medications   Medication Sig Start Date End Date Taking? Authorizing Provider  acetaminophen (TYLENOL) 325 MG tablet Take 650 mg by mouth every 6 (six) hours as needed for headache.    [provider]  baclofen (LIORESAL) 10 MG tablet Take 1 tablet (10 mg total) by mouth 3 (three)  times daily as needed for muscle spasms. 01/23/19   Kameko Hukill B, FNP  levETIRAcetam (KEPPRA) 500 MG tablet Take 1 tablet (500 mg total) by mouth 2 (two) times daily. 02/02/17   Davonna Belling, MD  meloxicam (MOBIC) 15 MG tablet Take 1 tablet (15 mg total) by mouth daily. 01/23/19   Victorino Dike, FNP    Allergies Latex; Penicillins; Ibuprofen; Pineapple; and Vicodin [hydrocodone-acetaminophen]  Family History  Problem Relation Age of Onset  . Diabetes Mother   . Diabetes Maternal Aunt   . Diabetes Maternal Uncle   . Anesthesia problems Neg Hx     Social History Social History   Tobacco Use  . Smoking status: Current Every Day Smoker    Packs/day: 1.00    Years: 14.00    Pack years: 14.00    Types: Cigarettes    Last attempt to quit: 11/08/2011    Years since quitting: 7.2  . Smokeless tobacco: Never Used  Substance Use Topics  . Alcohol use: No  . Drug use: No    Review of Systems Constitutional: Negative for fever. Cardiovascular: Negative for chest pain. Respiratory: Negative for shortness of breath. Musculoskeletal: Positive for right side jaw pain. Skin: Negative for open wounds or lesions. Neurological: Negative for decrease in sensation  ____________________________________________   PHYSICAL EXAM:  VITAL SIGNS: ED Triage Vitals  Enc Vitals Group     BP 01/23/19 1103 125/81     Pulse Rate 01/23/19 1103 (!) 103  Resp 01/23/19 1103 18     Temp 01/23/19 1103 98.4 F (36.9 C)     Temp Source 01/23/19 1103 Oral     SpO2 01/23/19 1103 98 %     Weight 01/23/19 1104 175 lb (79.4 kg)     Height 01/23/19 1104 5\' 2"  (1.575 m)     Head Circumference --      Peak Flow --      Pain Score 01/23/19 1103 10     Pain Loc --      Pain Edu? --      Excl. in GC? --     Constitutional: Alert and oriented. Well appearing and in no acute distress. Eyes: Conjunctivae are clear without discharge or drainage Head: Atraumatic Neck: Supple.  No focal midline  tenderness. Respiratory: No cough. Respirations are even and unlabored. Musculoskeletal: Tenderness to palpation over the right side TMJ and down to the mid mandible.  No focal bony abnormality is obvious. Neurologic: Awake, alert, oriented x4. Skin: Skin overlying the tender area on the right mandible is without open wounds, lesions, contusions. Psychiatric: Affect and behavior are appropriate.  ____________________________________________   LABS (all labs ordered are listed, but only abnormal results are displayed)  Labs Reviewed - No data to display ____________________________________________  RADIOLOGY  No acute abnormalities of the mandible. ____________________________________________   PROCEDURES  Procedures  ____________________________________________   INITIAL IMPRESSION / ASSESSMENT AND PLAN / ED COURSE  Erline LevineMartha A Markes is a 33 y.o. who presents to the emergency department for treatment and evaluation of right side lower jaw pain after seizure activity approximately 1 week ago.  She is able to open her mouth and speak but has pain with attempting to open her mouth very wide.  X-ray of the mandible is reassuring.  She will be treated with anti-inflammatories and advised to follow-up with the ear nose and throat specialist or her dentist.  She was instructed to return to the emergency department for symptoms of change or worsen if unable to schedule appointment.  She was also strongly advised to take her seizure medicine as prescribed and without missing any doses.  Medications - No data to display  Pertinent labs & imaging results that were available during my care of the patient were reviewed by me and considered in my medical decision making (see chart for details).  _________________________________________   FINAL CLINICAL IMPRESSION(S) / ED DIAGNOSES  Final diagnoses:  Arthralgia of right temporomandibular joint    ED Discharge Orders         Ordered     meloxicam (MOBIC) 15 MG tablet  Daily     01/23/19 1246    baclofen (LIORESAL) 10 MG tablet  3 times daily PRN     01/23/19 1246           If controlled substance prescribed during this visit, 12 month history viewed on the [facility] prior to issuing an initial prescription for Schedule II or III opiod.   Chinita Pesterriplett, Lavilla Delamora B, FNP 01/23/19 1519    Jeanmarie PlantMcShane, James A, MD 01/23/19 [phone]

## 2019-12-10 ENCOUNTER — Encounter: Payer: Self-pay | Admitting: Emergency Medicine

## 2019-12-10 ENCOUNTER — Other Ambulatory Visit: Payer: Self-pay

## 2019-12-10 ENCOUNTER — Emergency Department
Admission: EM | Admit: 2019-12-10 | Discharge: 2019-12-10 | Disposition: A | Discharge status: Home / Self Care | Payer: Medicaid Other | Attending: Student | Admitting: Student

## 2019-12-10 DIAGNOSIS — N760 Acute vaginitis: Secondary | ICD-10-CM

## 2019-12-10 DIAGNOSIS — Z79899 Other long term (current) drug therapy: Secondary | ICD-10-CM | POA: Diagnosis not present

## 2019-12-10 DIAGNOSIS — J45909 Unspecified asthma, uncomplicated: Secondary | ICD-10-CM | POA: Insufficient documentation

## 2019-12-10 DIAGNOSIS — Z91040 Latex allergy status: Secondary | ICD-10-CM | POA: Diagnosis not present

## 2019-12-10 DIAGNOSIS — N390 Urinary tract infection, site not specified: Secondary | ICD-10-CM | POA: Diagnosis not present

## 2019-12-10 DIAGNOSIS — F17210 Nicotine dependence, cigarettes, uncomplicated: Secondary | ICD-10-CM | POA: Insufficient documentation

## 2019-12-10 DIAGNOSIS — R300 Dysuria: Secondary | ICD-10-CM | POA: Diagnosis present

## 2019-12-10 LAB — URINALYSIS, COMPLETE (UACMP) WITH MICROSCOPIC
Bilirubin Urine: NEGATIVE
Glucose, UA: NEGATIVE mg/dL
Ketones, ur: NEGATIVE mg/dL
Nitrite: NEGATIVE
Protein, ur: NEGATIVE mg/dL
Specific Gravity, Urine: 1.021 (ref 1.005–1.030)
pH: 6 (ref 5.0–8.0)

## 2019-12-10 LAB — WET PREP, GENITAL
Clue Cells Wet Prep HPF POC: NONE SEEN
Sperm: NONE SEEN
Trich, Wet Prep: NONE SEEN
Yeast Wet Prep HPF POC: NONE SEEN

## 2019-12-10 LAB — COMPREHENSIVE METABOLIC PANEL
ALT: 11 U/L (ref 0–44)
AST: 13 U/L — ABNORMAL LOW (ref 15–41)
Albumin: 3.8 g/dL (ref 3.5–5.0)
Alkaline Phosphatase: 35 U/L — ABNORMAL LOW (ref 38–126)
Anion gap: 9 (ref 5–15)
BUN: 13 mg/dL (ref 6–20)
CO2: 21 mmol/L — ABNORMAL LOW (ref 22–32)
Calcium: 8.8 mg/dL — ABNORMAL LOW (ref 8.9–10.3)
Chloride: 109 mmol/L (ref 98–111)
Creatinine, Ser: 0.8 mg/dL (ref 0.44–1.00)
GFR calc Af Amer: >60 mL/min (ref >60)
GFR calc non Af Amer: >60 mL/min (ref >60)
Glucose, Bld: 112 mg/dL — ABNORMAL HIGH (ref 70–99)
Potassium: 3.7 mmol/L (ref 3.5–5.1)
Sodium: 139 mmol/L (ref 135–145)
Total Bilirubin: 0.3 mg/dL (ref 0.3–1.2)
Total Protein: 7.4 g/dL (ref 6.5–8.1)

## 2019-12-10 LAB — CBC
HCT: 39.6 % (ref 36.0–46.0)
Hemoglobin: 13.5 g/dL (ref 12.0–15.0)
MCH: 33.8 pg (ref 26.0–34.0)
MCHC: 34.1 g/dL (ref 30.0–36.0)
MCV: 99 fL (ref 80.0–100.0)
Platelets: 328 10*3/uL (ref 150–400)
RBC: 4 MIL/uL (ref 3.87–5.11)
RDW: 11.8 % (ref 11.5–15.5)
WBC: 9.8 10*3/uL (ref 4.0–10.5)
nRBC: 0 % (ref 0.0–0.2)

## 2019-12-10 LAB — LIPASE, BLOOD: Lipase: 27 U/L (ref 11–51)

## 2019-12-10 LAB — POC URINE PREG, ED: Preg Test, Ur: NEGATIVE

## 2019-12-10 MED ORDER — NITROFURANTOIN MONOHYD MACRO 100 MG PO CAPS: 100.0000 mg | ORAL_CAPSULE | Freq: Two times a day (BID) | ORAL | 0 refills | Status: DC

## 2019-12-10 MED ORDER — SODIUM CHLORIDE 0.9% FLUSH: 3.0000 mL | Freq: Once | INTRAVENOUS | Status: DC

## 2019-12-10 NOTE — ED Provider Notes (Signed)
[facility] Emergency Department Provider Note  ____________________________________________   First MD Initiated Contact with Patient 12/10/19 1807     (approximate)  I have reviewed the triage vital signs and the nursing notes.   HISTORY  Chief Complaint Dysuria and Abdominal Pain    HPI Jenny Baldwin is a 34 y.o. female presents emergency department complaining of dysuria, pressure, odor, and some vaginal discharge.  Patient states she took a bath and used oils on the vaginal area and thinks that this is upset everything.  She has not had sex in 7 years so she is not concerned about a STI.  Denies fever or chills.   Symptoms for 7 days.   Past Medical History:  Diagnosis Date  . Acid reflux   . Asthma   . Bipolar 1 disorder ([facility])   . Chronic right hip pain   . Prediabetes   . PTSD (post-traumatic stress disorder)   . Recurrent UTI    with pregnancy  . SVD (spontaneous vaginal delivery)    x 5    Patient Active Problem List   Diagnosis Date Noted  . Major depressive disorder, recurrent, severe without psychotic features ([facility])   . PTSD (post-traumatic stress disorder) 04/22/2013  . Cesarean delivery, without mention of indication, delivered, with or without mention of antepartum condition 05/11/2012    Past Surgical History:  Procedure Laterality Date  . CESAREAN SECTION  05/08/2012   Procedure: CESAREAN SECTION;  Surgeon: Brock Bad, MD;  Location: [facility];  Service: Obstetrics;  Laterality: N/A;  Primary Cesarean Section Delivery Girl @ 1555, Apgars 9/9  . NO PAST SURGERIES      Prior to Admission medications   Medication Sig Start Date End Date Taking? Authorizing Provider  acetaminophen (TYLENOL) 325 MG tablet Take 650 mg by mouth every 6 (six) hours as needed for headache.    [provider]  baclofen (LIORESAL) 10 MG tablet Take 1 tablet (10 mg total) by mouth 3 (three) times daily as needed for muscle spasms. 01/23/19    Triplett, Cari B, FNP  levETIRAcetam (KEPPRA) 500 MG tablet Take 1 tablet (500 mg total) by mouth 2 (two) times daily. 02/02/17   Benjiman Core, MD  meloxicam (MOBIC) 15 MG tablet Take 1 tablet (15 mg total) by mouth daily. 01/23/19   Triplett, Cari B, FNP  nitrofurantoin, macrocrystal-monohydrate, (MACROBID) 100 MG capsule Take 1 capsule (100 mg total) by mouth 2 (two) times daily. 12/10/19   Faythe Ghee, PA-C    Allergies Latex, Penicillins, Ibuprofen, Pineapple, and Vicodin [hydrocodone-acetaminophen]  Family History  Problem Relation Age of Onset  . Diabetes Mother   . Diabetes Maternal Aunt   . Diabetes Maternal Uncle   . Anesthesia problems Neg Hx     Social History Social History   Tobacco Use  . Smoking status: Current Every Day Smoker    Packs/day: 1.00    Years: 14.00    Pack years: 14.00    Types: Cigarettes    Last attempt to quit: 11/08/2011    Years since quitting: 8.0  . Smokeless tobacco: Never Used  Substance Use Topics  . Alcohol use: No  . Drug use: No    Review of Systems  Constitutional: No fever/chills Eyes: No visual changes. ENT: No sore throat. Respiratory: Denies cough Cardiovascular: Denies chest pain Gastrointestinal: Denies abdominal pain Genitourinary: Positive for dysuria.  Denies vaginal discharge Musculoskeletal: Negative for back pain. Skin: Negative for rash. Psychiatric: no mood changes,  ____________________________________________   PHYSICAL EXAM:  VITAL SIGNS: ED Triage Vitals [12/10/19 1701]  Enc Vitals Group     BP 134/78     Pulse Rate 85     Resp 20     Temp 98.5 F (36.9 C)     Temp Source Oral     SpO2 100 %     Weight 180 lb (81.6 kg)     Height 5\' 2"  (1.575 m)     Head Circumference      Peak Flow      Pain Score 10     Pain Loc      Pain Edu?      Excl. in GC?     Constitutional: Alert and oriented. Well appearing and in no acute distress. Eyes: Conjunctivae are normal.  Head:  Atraumatic. Nose: No congestion/rhinnorhea. Mouth/Throat: Mucous membranes are moist.   Neck:  supple no lymphadenopathy noted Cardiovascular: Normal rate, regular rhythm. Respiratory: Normal respiratory effort.  No retractions Abd: soft nontender bs normal all 4 quad GU: deferred Musculoskeletal: FROM all extremities, warm and well perfused Neurologic:  Normal speech and language.  Skin:  Skin is warm, dry and intact. No rash noted. Psychiatric: Mood and affect are normal. Speech and behavior are normal.  ____________________________________________   LABS (all labs ordered are listed, but only abnormal results are displayed)  Labs Reviewed  WET PREP, GENITAL - Abnormal; Notable for the following components:      Result Value   WBC, Wet Prep HPF POC FEW (*)    All other components within normal limits  COMPREHENSIVE METABOLIC PANEL - Abnormal; Notable for the following components:   CO2 21 (*)    Glucose, Bld 112 (*)    Calcium 8.8 (*)    AST 13 (*)    Alkaline Phosphatase 35 (*)    All other components within normal limits  URINALYSIS, COMPLETE (UACMP) WITH MICROSCOPIC - Abnormal; Notable for the following components:   Color, Urine YELLOW (*)    APPearance CLEAR (*)    Hgb urine dipstick SMALL (*)    Leukocytes,Ua SMALL (*)    Bacteria, UA RARE (*)    All other components within normal limits  LIPASE, BLOOD  CBC  POC URINE PREG, ED   ____________________________________________   ____________________________________________  RADIOLOGY    ____________________________________________   PROCEDURES  Procedure(s) performed: No  Procedures    ____________________________________________   INITIAL IMPRESSION / ASSESSMENT AND PLAN / ED COURSE  Pertinent labs & imaging results that were available during my care of the patient were reviewed by me and considered in my medical decision making (see chart for details).   Patient is 34 year old female presents  emergency department with urinary frequency, dysuria, pressure, and a fishy odor along with some vaginal discharge.  Symptoms for 1 week.  See HPI  Physical exam shows patient to appear well.  External pelvic exam does not show any lesions.  Speculum exam shows minimal discharge no herpetic lesions noted  Did explain all findings to the patient.  The wet prep only shows WBCs, UA has small amount of leuks with rare bacteria, comprehensive metabolic panel is basically normal with minimal elevations and decreases some some of the labs, I do not feel these are significant, lipase is normal, CBC is normal,  I do feel the labs are reassuring.  Explained to the patient that she does not have bacterial vaginosis.  We will treat her for UTI.  She is to not apply any  soaps or products in her vaginal area as I do feel she also has vaginitis.  She is to follow-up with her regular doctor if not improving in 3 days.  Return emergency department worsening.  She states she understands will comply.  Is discharged stable condition.    Jenny Baldwin was evaluated in Emergency Department on 12/10/2019 for the symptoms described in the history of present illness. She was evaluated in the context of the global COVID-19 pandemic, which necessitated consideration that the patient might be at risk for infection with the SARS-CoV-2 virus that causes COVID-19. Institutional protocols and algorithms that pertain to the evaluation of patients at risk for COVID-19 are in a state of rapid change based on information released by regulatory bodies including the CDC and federal and state organizations. These policies and algorithms were followed during the patient's care in the ED.   As part of my medical decision making, I reviewed the following data within the [place] notes reviewed and incorporated, Labs reviewed , Old chart reviewed, Notes from prior ED visits and [place] Controlled Substance  Database  ____________________________________________   FINAL CLINICAL IMPRESSION(S) / ED DIAGNOSES  Final diagnoses:  Urinary tract infection without hematuria, site unspecified  Acute vaginitis      NEW MEDICATIONS STARTED DURING THIS VISIT:  New Prescriptions   NITROFURANTOIN, MACROCRYSTAL-MONOHYDRATE, (MACROBID) 100 MG CAPSULE    Take 1 capsule (100 mg total) by mouth 2 (two) times daily.     Note:  This document was prepared using Dragon voice recognition software and may include unintentional dictation errors.    Versie Starks, PA-C 12/10/19 1917    Lilia Pro., MD 12/11/19 [phone]

## 2019-12-10 NOTE — ED Triage Notes (Signed)
Pt presents to ED via POV with c/o dysuria that started approx 1 week ago, states has progressive worsened and now has lower abdominal pain. Pt states pain started after she took a bath and used oils on her vaginal area after shaving.

## 2019-12-10 NOTE — Discharge Instructions (Signed)
Follow-up with your regular doctor if not improving in 3 days.  Return emergency department for worsening.  Take medication as prescribed.  Avoid using any harsh soaps or products on your vaginal area.

## 2019-12-10 NOTE — ED Notes (Signed)
Pt states having pressure when she has to pee, frequency urinating, burning when she pees, and that patient states there is a fishy odor when she pees.

## 2019-12-29 NOTE — Progress Notes (Signed)
Patient ID: Jenny Baldwin, female    DOB: [birth year], 34 y.o.   MRN: [id number]  PCP: Jamelle Haring, MD  Chief Complaint  Patient presents with  . New Patient (Initial Visit)  . Establish Care  . Seizures    Started 3 years ago on father's day  . Insomnia    usualy goes to sleep around 3-4 am and wakes up at 8am    Subjective:   Jenny Baldwin is a 34 y.o. female, presents to clinic with CC of the following:  Chief Complaint  Patient presents with  . New Patient (Initial Visit)  . Establish Care  . Seizures    Started 3 years ago on father's day  . Insomnia    usualy goes to sleep around 3-4 am and wakes up at 8am    HPI:  Patient is a 34 year old female who presents new to the practice.  She was most recently seen in the emergency room 12/10/2019 treated for a possible UTI and concerns for vaginitis noted. She notes that her symptoms have improved, and she is staying well-hydrated.  Denies recent dysuria, increased frequency, or blood in the urine Reviewing prior history on epic, her care has been predominantly through the emergency department in the past 4 to 5 years.  Problems noted on her problem list include   bipolar disorder, major depression, PTSD - was seeing a Dr Wynelle Link in [facility], not in past 3-4 years. Insurance reasons. Was admitted years ago for suicide attempt, denies recent thoughts of hurting self or others presently, trying to stay positive, children mean everything to me - has 6, and would never hurt herself as would affect them.  PHQ-9 and GAD-7 reviewed-are both positive, and still has depressive symptoms frequently.  She also notes she gets quite anxious at times, and has not been on medicines in the recent past for depression or anxiety.  Asthma - not have inhaler now, sometimes when gets up wheezes, coughs - more with allergies and pollen increase. Has been trying zyrtec for allergies and a [employer] generic OTC "pump".  Off inhalers for  awhile, never on an every day inhaler, only prn. Has no marked shortness of breath episodes, and noted she does not like to take a lot of medicines if she does not need them.  GERD - related to what she eats, has occas symptoms, has chewable antacids that work, takes two of them  No recent dark or black stools, no BRBPR. Notes intermittent loose stools with constipation. Notes immodium not work for her, nor do other OTC anti-diarrheal meds.  She seems to have more loose stools than constipation concerns.  Discussed possible irritable bowel but that possibly mentioned in the past as well.  Prediabetes  Had in past, medicaid cut off and not seen after Notes often increased thirst, occas urine frequency Does have a positive family history of diabetes noted.  Obesity Weight has been increasing in the recent past. Not think eating much to cause this, eats a lot of salads, states tries to eat healthier when can. Exercise  - no regular exercise, notes due to pains that occur in knees, back Wt Readings from Last 3 Encounters:  12/30/19 194 lb 3.2 oz (88.1 kg)  12/10/19 180 lb (81.6 kg)  01/23/19 175 lb (79.4 kg)    Today, she noted she has a history of seizures, with it starting approximately 3 years ago on Father's Day.  Went to hospital after happened  and diagnosed with tonic-clonic seizures. She states the most recent one occurred about a week ago. Can feel coming on, then slides to floor, kids and husband witness and she has movements that are like a tonic-clonic seizure by description, last minute or two she thinks, doesn't remember these happening after, then body hurts after.  May be a little fatigued. She has not been seen by or followed by neurology for this. Not have insurance. Stopped Keppra, not like way felt on this, not on for past year or two. She notes she is not driving presently, can't drive. Notes too anxious to drive and she does not know how  She also notes she has insomnia,  with her usually getting to sleep around 3-4  in the morning and waking up at approximately 8 in the morning. Notes cannot sleep, mind will not let her sleep. Stops all caffeine by 6 pm, does not drink alcohol.  He does sometimes try to watch TV before she goes to bed, but notes just unable to relax enough to get to sleep.  She has been on medicine in the past at one point to help, not recent past. She has been trying a Tylenol PM type product.  Women's health- many years since had PAP she thinks,  Had pelvic exam on recent visit to ER noted  Tobacco-current every day smoker, 1 PPD Alcohol-denied use Denied other drug use in her past  Patient Active Problem List   Diagnosis Date Noted  . Major depressive disorder, recurrent, severe without psychotic features (HCC)   . PTSD (post-traumatic stress disorder) 04/22/2013  . Cesarean delivery, without mention of indication, delivered, with or without mention of antepartum condition 05/11/2012      Current Outpatient Medications:  .  diphenhydramine-acetaminophen (TYLENOL PM) 25-500 MG TABS tablet, Take 1 tablet by mouth at bedtime as needed., Disp: , Rfl:  When reviewed current medications, she notes she really does not like to take medications  Allergies  Allergen Reactions  . Latex Itching and Rash  . Penicillins Swelling    Has patient had a PCN reaction causing immediate rash, facial/tongue/throat swelling, SOB or lightheadedness with hypotension: Unknown Has patient had a PCN reaction causing severe rash involving mucus membranes or skin necrosis: Unknown Has patient had a PCN reaction that required hospitalization: Unknown Has patient had a PCN reaction occurring within the last 10 years: Unknown If all of the above answers are "NO", then may proceed with Cephalosporin use.   . Ibuprofen Nausea Only  . Pineapple Itching  . Vicodin [Hydrocodone-Acetaminophen] Rash     Past Surgical History:  Procedure Laterality Date  .  CESAREAN SECTION  05/08/2012   Procedure: CESAREAN SECTION;  Surgeon: Brock Bad, MD;  Location: [facility];  Service: Obstetrics;  Laterality: N/A;  Primary Cesarean Section Delivery Girl @ 1555, Apgars 9/9  . NO PAST SURGERIES       Family History  Problem Relation Age of Onset  . Diabetes Mother   . Diabetes Maternal Aunt   . Diabetes Maternal Uncle   . Anesthesia problems Neg Hx      Social History   Tobacco Use  . Smoking status: Current Every Day Smoker    Packs/day: 1.00    Years: 14.00    Pack years: 14.00    Types: Cigarettes    Last attempt to quit: 11/08/2011    Years since quitting: 8.1  . Smokeless tobacco: Never Used  Substance Use Topics  . Alcohol use: No  With staff assistance, above reviewed with the patient today.  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, she states was caused by her seizures, also has lower dentures, no concerning recent chest pains, she does note her legs swell occasionally, often when out and about on her feet and in her ankle area, notes she cannot grip as well with her right hand versus her left after she had a fracture that healed with some arthritis symptoms after that as well noted, no balance difficulties, no weakness or numbness or tingling in her extremities otherwise, otherwise no specific complaints on a limited and focused system review   No results found for this or any previous visit (from the past 72 hour(s)).   PHQ2/9: Depression screen PHQ 2/9 12/30/2019  Decreased Interest 1  Down, Depressed, Hopeless 3  PHQ - 2 Score 4  Altered sleeping 3  Tired, decreased energy 3  Change in appetite 2  Feeling  bad or failure about yourself  3  Trouble concentrating 3  Moving slowly or fidgety/restless 0  Suicidal thoughts 1  PHQ-9 Score 19  Difficult doing work/chores Very difficult   PHQ-2/9 Result reviewed, denied SI when reviewed with her GAD 7 : Generalized Anxiety Score 12/30/2019  Nervous, Anxious, on Edge 2  Control/stop worrying 2  Worry too much - different things 2  Trouble relaxing 2  Restless 2  Easily annoyed or irritable 2  Afraid - awful might happen 2  Total GAD 7 Score 14  Anxiety Difficulty Very difficult    Result reviewed  Fall Risk: Fall Risk  12/30/2019  Falls in the past year? 0  Number falls in past yr: 0  Injury with Fall? 0      Objective:   Vitals:   12/30/19 0850  BP: 124/88  Pulse: 85  Resp: 16  Temp: 98.1 F (36.7 C)  TempSrc: Temporal  SpO2: 96%  Weight: 194 lb 3.2 oz (88.1 kg)  Height: 5' 2.75" (1.594 m)    Body mass index is 34.68 kg/m.  Physical Exam   NAD, masked HEENT - [place]/AT, sclera anicteric, PERRL, EOMI, conj - non-inj'ed, TM's and canals clear, pharynx clear Neck - supple, no adenopathy, no TM, carotids 2+ and = without bruits bilat Car - RRR without m/g/r Pulm- RR and effort normal at rest, CTA without wheeze or rales Abd - soft, NT, ND, BS+,  no masses Back - no CVA tenderness Skin- no rash noted, no new lesions of concern noted on exposed areas, patient denied otherwise Ext - no LE edema, no active joints Neuro/psychiatric - affect was not flat, appropriate with conversation  Alert and oriented  Grossly non-focal - good strength on testing extremities, sensation intact to LT in distal extremities  Speech and gait are normal   Results for orders placed or performed during the hospital encounter of 12/10/19  Wet prep, genital  Result Value Ref Range   Yeast Wet Prep HPF POC NONE SEEN NONE SEEN   Trich, Wet Prep NONE SEEN NONE SEEN   Clue Cells Wet Prep HPF POC NONE SEEN NONE SEEN   WBC, Wet Prep HPF POC FEW (A)  NONE  SEEN   Sperm NONE SEEN   Lipase, blood  Result Value Ref Range   Lipase 27 11 - 51 U/L  Comprehensive metabolic panel  Result Value Ref Range   Sodium 139 135 - 145 mmol/L   Potassium 3.7 3.5 - 5.1 mmol/L   Chloride 109 98 - 111 mmol/L   CO2 21 (L) 22 - 32 mmol/L   Glucose, Bld 112 (H) 70 - 99 mg/dL   BUN 13 6 - 20 mg/dL   Creatinine, Ser 0.80 0.44 - 1.00 mg/dL   Calcium 8.8 (L) 8.9 - 10.3 mg/dL   Total Protein 7.4 6.5 - 8.1 g/dL   Albumin 3.8 3.5 - 5.0 g/dL   AST 13 (L) 15 - 41 U/L   ALT 11 0 - 44 U/L   Alkaline Phosphatase 35 (L) 38 - 126 U/L   Total Bilirubin 0.3 0.3 - 1.2 mg/dL   GFR calc non Af Amer >60 >60 mL/min   GFR calc Af Amer >60 >60 mL/min   Anion gap 9 5 - 15  CBC  Result Value Ref Range   WBC 9.8 4.0 - 10.5 K/uL   RBC 4.00 3.87 - 5.11 MIL/uL   Hemoglobin 13.5 12.0 - 15.0 g/dL   HCT 39.6 36.0 - 46.0 %   MCV 99.0 80.0 - 100.0 fL   MCH 33.8 26.0 - 34.0 pg   MCHC 34.1 30.0 - 36.0 g/dL   RDW 11.8 11.5 - 15.5 %   Platelets 328 150 - 400 K/uL   nRBC 0.0 0.0 - 0.2 %  Urinalysis, Complete w Microscopic  Result Value Ref Range   Color, Urine YELLOW (A) YELLOW   APPearance CLEAR (A) CLEAR   Specific Gravity, Urine 1.021 1.005 - 1.030   pH 6.0 5.0 - 8.0   Glucose, UA NEGATIVE NEGATIVE mg/dL   Hgb urine dipstick SMALL (A) NEGATIVE   Bilirubin Urine NEGATIVE NEGATIVE   Ketones, ur NEGATIVE NEGATIVE mg/dL   Protein, ur NEGATIVE NEGATIVE mg/dL   Nitrite NEGATIVE NEGATIVE   Leukocytes,Ua SMALL (A) NEGATIVE   RBC / HPF 0-5 0 - 5 RBC/hpf   WBC, UA 21-50 0 - 5 WBC/hpf   Bacteria, UA RARE (A) NONE SEEN   Squamous Epithelial / LPF 0-5 0 - 5  POC urine preg, ED  Result Value Ref Range   Preg Test, Ur Negative Negative       Assessment & Plan:  Multiple issues were addressed today, with the most pressing felt to be her seizures and mental health issues.  When discussed some medications for different entities, she noted she really does not like to take medications,  although do feel they are needed for the above 2 issues noted, and await input from consultants to help in that regard.  We will hold off on adding a lot of other medications presently  1. Encounter to establish care with new doctor  2. History of seizure Concern with her seizure history, and still having seizures as she describes.  Last one a week ago.  Was on a medicine to treat this in the past, although stopped as she did not like the way she felt on that and has not been on one in the recent past. Do feel getting input from neurology as needed with the referral written. Also order some lab tests today. She does not drive presently and informed her she should not drive, and she states that is not an issue as she does not know  how - COMPLETE METABOLIC PANEL WITH GFR - Ambulatory referral to Neurology  3. Insomnia, unspecified type Question the relationship to her mental health issues, and has been more chronic.  She has tried a Tylenol PM product in the past, although really has not changed much.  Discussed some sleep hygiene issues with her today to help. A product like trazodone may be something to add, although will await input from psychiatry that was consulted presently before rushing to add that medicine today We will check some labs including a thyroid test.  - TSH  4. Class 1 obesity due to excess calories with serious comorbidity and body mass index (BMI) of 34.0 to 34.9 in adult Noted her recent increase in weight, and the importance of weight management for for her overall health, and some of the medical issues as well.  She does not exercise at all, and states she cannot do to some back and leg pains at times, recommended trying to be more active in general.  Also dietary modifications important with any weight maintenance issues. We will check some labs today including a thyroid test. - TSH - Lipid panel - COMPLETE METABOLIC PANEL WITH GFR - Hemoglobin A1c  5.  Prediabetes Has a history of this, although never further evaluated or treated after this was noted to her in the past. We will check some labs today including an A1c. - TSH - COMPLETE METABOLIC PANEL WITH GFR - Hemoglobin A1c  6. Major depressive disorder, recurrent, severe without psychotic features (HCC) PHQ-9 was reviewed with her, and she still has depressive symptoms noted.  Discussed her 1 noted on the suicidal ideation question, and she adamantly denies she has no thoughts of hurting self presently, noting mostly concerns with how that would affect her children and she would never do that to them. Discussed having psychiatry involved to help, especially with her history of bipolar noted as well as PTSD, as I do think they will be helpful in managing, and she agreed to proceed with that.  Psychiatry referral was written. - TSH - COMPLETE METABOLIC PANEL WITH GFR - CBC with Differential/Platelet - Ambulatory referral to Psychiatry  7. PTSD (post-traumatic stress disorder) As noted above, with her noting anxiety also an issue at times - Ambulatory referral to Psychiatry  8. Gastroesophageal reflux disease without esophagitis Doing well with periodic chewable antacids, and will continue. Also reflux precautions noted to help - COMPLETE METABOLIC PANEL WITH GFR - CBC with Differential/Platelet  9. Mild intermittent asthma without complication/ALlergic rhinitis contibution Not markedly limited with asthma symptoms presently, with slight worsening at times with increased allergy symptoms.  She manages with Zyrtec, and an over-the-counter product presently and we will hold off on adding an albuterol inhaler to have to use as needed.  If she is having more symptoms of concern is to follow-up to reassess  10. Encounter for hepatitis C screening test for low risk patient She agreed to have this screening test today - Hepatitis C antibody  11. Screening for HIV (human immunodeficiency  virus) She agreed to have this screening test today - HIV Antibody (routine testing w rflx)  12. Screening for lipid disorders She agreed to have this screening test today, and with her weight increase, do feel would be helpful to check her lipid status - Lipid panel  13. Screening for thyroid disorder As above, checking a TSH for a thyroid screen  He will follow up with a female colleague in the next month or 2 for  a woman's health evaluation, as that is overdue. Await lab results, and consultant input, and follow-up at the latest in 3 months time, sooner as needed.         Jamelle Haring, MD 12/30/19 9:01 AM

## 2019-12-30 ENCOUNTER — Other Ambulatory Visit: Payer: Self-pay

## 2019-12-30 ENCOUNTER — Ambulatory Visit: Payer: Medicaid Other | Admitting: Internal Medicine

## 2019-12-30 ENCOUNTER — Encounter: Payer: Self-pay | Admitting: Internal Medicine

## 2019-12-30 VITALS — BP 124/88 | HR 85 | Temp 98.1°F | Resp 16 | Ht 62.75 in | Wt 194.2 lb

## 2019-12-30 DIAGNOSIS — Z87898 Personal history of other specified conditions: Secondary | ICD-10-CM

## 2019-12-30 DIAGNOSIS — Z13220 Encounter for screening for lipoid disorders: Secondary | ICD-10-CM

## 2019-12-30 DIAGNOSIS — F332 Major depressive disorder, recurrent severe without psychotic features: Secondary | ICD-10-CM

## 2019-12-30 DIAGNOSIS — Z7689 Persons encountering health services in other specified circumstances: Secondary | ICD-10-CM

## 2019-12-30 DIAGNOSIS — G4700 Insomnia, unspecified: Secondary | ICD-10-CM | POA: Diagnosis not present

## 2019-12-30 DIAGNOSIS — J4520 Mild intermittent asthma, uncomplicated: Secondary | ICD-10-CM | POA: Insufficient documentation

## 2019-12-30 DIAGNOSIS — J301 Allergic rhinitis due to pollen: Secondary | ICD-10-CM | POA: Insufficient documentation

## 2019-12-30 DIAGNOSIS — E6609 Other obesity due to excess calories: Secondary | ICD-10-CM | POA: Insufficient documentation

## 2019-12-30 DIAGNOSIS — F4310 Post-traumatic stress disorder, unspecified: Secondary | ICD-10-CM

## 2019-12-30 DIAGNOSIS — R7303 Prediabetes: Secondary | ICD-10-CM

## 2019-12-30 DIAGNOSIS — Z1159 Encounter for screening for other viral diseases: Secondary | ICD-10-CM

## 2019-12-30 DIAGNOSIS — K219 Gastro-esophageal reflux disease without esophagitis: Secondary | ICD-10-CM

## 2019-12-30 DIAGNOSIS — Z6834 Body mass index (BMI) 34.0-34.9, adult: Secondary | ICD-10-CM

## 2019-12-30 DIAGNOSIS — Z1329 Encounter for screening for other suspected endocrine disorder: Secondary | ICD-10-CM

## 2019-12-30 DIAGNOSIS — Z114 Encounter for screening for human immunodeficiency virus [HIV]: Secondary | ICD-10-CM

## 2019-12-31 ENCOUNTER — Other Ambulatory Visit: Payer: Self-pay

## 2019-12-31 ENCOUNTER — Telehealth: Payer: Self-pay

## 2019-12-31 ENCOUNTER — Emergency Department
Admission: EM | Admit: 2019-12-31 | Discharge: 2019-12-31 | Disposition: A | Discharge status: Home / Self Care | Payer: Medicaid Other | Attending: Emergency Medicine | Admitting: Emergency Medicine

## 2019-12-31 DIAGNOSIS — M6281 Muscle weakness (generalized): Secondary | ICD-10-CM | POA: Insufficient documentation

## 2019-12-31 DIAGNOSIS — Z5321 Procedure and treatment not carried out due to patient leaving prior to being seen by health care provider: Secondary | ICD-10-CM | POA: Diagnosis not present

## 2019-12-31 LAB — LIPID PANEL
Cholesterol: 242 mg/dL — ABNORMAL HIGH (ref <200)
HDL: 44 mg/dL — ABNORMAL LOW (ref >=50)
LDL Cholesterol (Calc): 155 mg/dL (calc) — ABNORMAL HIGH
Non-HDL Cholesterol (Calc): 198 mg/dL (calc) — ABNORMAL HIGH (ref <130)
Total CHOL/HDL Ratio: 5.5 (calc) — ABNORMAL HIGH (ref <5.0)
Triglycerides: 265 mg/dL — ABNORMAL HIGH (ref <150)

## 2019-12-31 LAB — BASIC METABOLIC PANEL
Anion gap: 8 (ref 5–15)
BUN: 9 mg/dL (ref 6–20)
CO2: 23 mmol/L (ref 22–32)
Calcium: 9.1 mg/dL (ref 8.9–10.3)
Chloride: 106 mmol/L (ref 98–111)
Creatinine, Ser: 0.86 mg/dL (ref 0.44–1.00)
GFR calc Af Amer: >60 mL/min (ref >60)
GFR calc non Af Amer: >60 mL/min (ref >60)
Glucose, Bld: 105 mg/dL — ABNORMAL HIGH (ref 70–99)
Potassium: 4 mmol/L (ref 3.5–5.1)
Sodium: 137 mmol/L (ref 135–145)

## 2019-12-31 LAB — COMPLETE METABOLIC PANEL WITH GFR
AG Ratio: 1.5 (calc) (ref 1.0–2.5)
ALT: 14 U/L (ref 6–29)
AST: 13 U/L (ref 10–30)
Albumin: 4.4 g/dL (ref 3.6–5.1)
Alkaline phosphatase (APISO): 41 U/L (ref 31–125)
BUN/Creatinine Ratio: 7 (calc) (ref 6–22)
BUN: 6 mg/dL — ABNORMAL LOW (ref 7–25)
CO2: 24 mmol/L (ref 20–32)
Calcium: 9.7 mg/dL (ref 8.6–10.2)
Chloride: 107 mmol/L (ref 98–110)
Creat: 0.83 mg/dL (ref 0.50–1.10)
GFR, Est African American: 106 mL/min/{1.73_m2} (ref >=60)
GFR, Est Non African American: 91 mL/min/{1.73_m2} (ref >=60)
Globulin: 3 g/dL (calc) (ref 1.9–3.7)
Glucose, Bld: 94 mg/dL (ref 65–99)
Potassium: 4 mmol/L (ref 3.5–5.3)
Sodium: 143 mmol/L (ref 135–146)
Total Bilirubin: 0.3 mg/dL (ref 0.2–1.2)
Total Protein: 7.4 g/dL (ref 6.1–8.1)

## 2019-12-31 LAB — CBC WITH DIFFERENTIAL/PLATELET
Absolute Monocytes: 782 cells/uL (ref 200–950)
Basophils Absolute: 46 cells/uL (ref 0–200)
Basophils Relative: 0.5 %
Eosinophils Absolute: 212 cells/uL (ref 15–500)
Eosinophils Relative: 2.3 %
HCT: 40.1 % (ref 35.0–45.0)
Hemoglobin: 13.7 g/dL (ref 11.7–15.5)
Lymphs Abs: 3294 cells/uL (ref 850–3900)
MCH: 33.9 pg — ABNORMAL HIGH (ref 27.0–33.0)
MCHC: 34.2 g/dL (ref 32.0–36.0)
MCV: 99.3 fL (ref 80.0–100.0)
MPV: 10.7 fL (ref 7.5–12.5)
Monocytes Relative: 8.5 %
Neutro Abs: 4867 cells/uL (ref 1500–7800)
Neutrophils Relative %: 52.9 %
Platelets: 307 10*3/uL (ref 140–400)
RBC: 4.04 10*6/uL (ref 3.80–5.10)
RDW: 11.9 % (ref 11.0–15.0)
Total Lymphocyte: 35.8 %
WBC: 9.2 10*3/uL (ref 3.8–10.8)

## 2019-12-31 LAB — TSH: TSH: 2.59 mIU/L

## 2019-12-31 LAB — HEMOGLOBIN A1C
Hgb A1c MFr Bld: 5.1 % of total Hgb (ref <5.7)
Mean Plasma Glucose: 100 (calc)
eAG (mmol/L): 5.5 (calc)

## 2019-12-31 LAB — CBC
HCT: 40.1 % (ref 36.0–46.0)
Hemoglobin: 13.8 g/dL (ref 12.0–15.0)
MCH: 33.7 pg (ref 26.0–34.0)
MCHC: 34.4 g/dL (ref 30.0–36.0)
MCV: 97.8 fL (ref 80.0–100.0)
Platelets: 323 10*3/uL (ref 150–400)
RBC: 4.1 MIL/uL (ref 3.87–5.11)
RDW: 11.9 % (ref 11.5–15.5)
WBC: 10.1 10*3/uL (ref 4.0–10.5)
nRBC: 0 % (ref 0.0–0.2)

## 2019-12-31 LAB — HEPATITIS C ANTIBODY
Hepatitis C Ab: NONREACTIVE
SIGNAL TO CUT-OFF: 0.02 (ref <1.00)

## 2019-12-31 LAB — GLUCOSE, CAPILLARY: Glucose-Capillary: 107 mg/dL — ABNORMAL HIGH (ref 70–99)

## 2019-12-31 LAB — HIV ANTIBODY (ROUTINE TESTING W REFLEX): HIV 1&2 Ab, 4th Generation: NONREACTIVE

## 2019-12-31 MED ORDER — SODIUM CHLORIDE 0.9% FLUSH: 3.0000 mL | Freq: Once | INTRAVENOUS | Status: DC

## 2019-12-31 NOTE — ED Notes (Signed)
Pt called x 2 for VS, no answer.  

## 2019-12-31 NOTE — Telephone Encounter (Signed)
Pt. Called back. Given Dr. Sunday Corn message. States "I feel like I'm going to pass out." States " I'm calling my husband to come home and stay with my kids." Instructed to call 911. Verbalizes understanding.

## 2019-12-31 NOTE — ED Notes (Signed)
Called x 2 for repeat VS.

## 2019-12-31 NOTE — Telephone Encounter (Signed)
With her history (seizure history), and as we discussed, if having any seizure like activity/passing out episodes, strongly encourage her to get to an emergency setting for evaluation.

## 2019-12-31 NOTE — Telephone Encounter (Signed)
Patient states she passed out today around 12:00, when she woke up her left leg had a shooting pain from knee cap down into her foot. She was lying down when she passed out she did not fall.  She says when she woke up this morning she felt dizzy and has felt this way throughout the day. She had a little bit of coffee and has eaten some today.   Copied from CRM [id number]. Topic: General - Call Back - No Documentation >> Dec 31, 2019 11:11 AM Randol Kern wrote: Reason for CRM: Call back request to discuss lab results  Best contact: [phone]

## 2019-12-31 NOTE — ED Triage Notes (Signed)
Pt arrives to ed via pov from home. Pt reports LOC x 2 today around 1200 and again after. Pt unable to states time of second loc. Pt states she was sitting down when it happened and denies falling. Denies any hx of prior episodes of loc. Pt c/o generalized weakness, headache, and left leg pain. NAD at this time. Pt is a&o x 4 but appears slightly drowsy during triage. GSC of 15. RR even and unlabored.

## 2020-01-07 DIAGNOSIS — R569 Unspecified convulsions: Secondary | ICD-10-CM | POA: Insufficient documentation

## 2020-01-25 NOTE — Progress Notes (Signed)
Patient ID: Jenny Baldwin, female    DOB: 17-Nov-1985, 34 y.o.   MRN: [id number]  PCP: Jamelle Haring, MD  Chief Complaint  Patient presents with  . Migraine    Subjective:   Jenny Baldwin is a 34 y.o. female, presents to clinic with CC of the following:  Chief Complaint  Patient presents with  . Migraine    HPI:  Patient is a 34 year old female who presented new to the practice on 12/30/2019 That note was reviewed, with a referral to neurology done due to concerns for her seizure history.  On 12/31/2019, she notified the office of her passing out of bed, and went to the emergency room, although she did not stay to be seen.  She followed up with neurology on 01/07/2020, with their assessment and plan as follows: - Start Lamictal. Week 1: take 1 pill at night Week 2: take 1 pill twice a day Week 3: take 1 pill AM and 2 pills PM Week 4: take 2 pills twice a day Week 5: take 2 pills AM and 3 pills PM Week 6: take 3 pills twice a day - Schedule routine EEG - Schedule extended in office EEG.  - We will order brain MRI. We will call you with this appointment. Please contact our office in one week if you have not heard about this appointment.   She did have a procedural visit with neurology on 01/16/20, and she noted there were technical difficulties that limited this evaluation.  Also referred to psychiatry after our previous visit, although as not been seen by them to date. She notes that she is not comfortable talking with people, and does not want to go to a place to be seen. I did note that can be helpful with online visits, and she very much was okay with pursuing that if it could be online. I did note a couple places in the area that would potentially be helpful, with information provided to her including a number to call (for [facility]), as I do think it is important to get their help presently and she agreed.  She follows up today with migraine headache concerns. She  notes that she went to [facility] urgent care recently, was given a headache cocktail to help, and that really bothered her stomach. She states when she gets headaches, her eyes start to burn, denies any loss of vision or marked vision changes, and then feels headaches sometimes in the temple regions sometimes in the back of her head, on both sides usually. The lights are bothersome. She denies any nausea or vomiting, although of note she does not eat anything to lessen the chance. No obvious auras preceding. Recently, she has been having the headaches very frequently, and has not been taking anything as the Tylenol tried prior does not help, and the ibuprofen products really bother her stomach. She just closes her eyes at rest when they occur. He notes that since starting the Lamictal, it seems like these headaches have increased. She kept mentioning the term migraine, and when asked about her history, she notes she has had headaches in the past, with migraine term noted. She had a recent tooth infection, was given antibiotics, and that has since resolved. That was 1-1/2 to 2 weeks ago. She denies any more recent fevers, no sore throats or cough, no neck rigidity. Also denies any chest pains or shortness of breath. Denies any one-sided weakness or numbness or tingling concerns. She  notes her anxiety has increased with these headaches, still having depression symptoms at times.GAD-7 and PHQ-9 from today were reviewed, with the GAD increased some from last visit, after noting she is feeling more anxious in the recent past. At times has some feelings of sadness and depression, although denies any thoughts of hurting self or hurting others. He has not been on medicines in the recent past for anxiety or depression She also notes her weight has been up and down, with still no regular exercise, and she noted when she tries to exert herself more she gets more headaches. Wt Readings from Last 3 Encounters:  01/26/20 194  lb 8 oz (88.2 kg)  12/31/19 194 lb (88 kg)  12/30/19 194 lb 3.2 oz (88.1 kg)   Hyperlipidemia -noted this to her from the recent blood work today Medication regimen-none presently Trying to manage with diet and exercise presently Lab Results  Component Value Date   CHOL 242 (H) 12/30/2019   HDL 44 (L) 12/30/2019   LDLCALC 155 (H) 12/30/2019   TRIG 265 (H) 12/30/2019   CHOLHDL 5.5 (H) 12/30/2019   Seizure hx -currently followed by neurology, with work-up in progress. She has a follow-up with them on June 22. She is not currently driving  bipolar disorder, major depression, PTSD hx -had been seeing a physician in [facility] area up until 3 to 4 years ago, with that stopped due to lack of insurance. I did refer to psychiatry on our last visit, although no arrangements to be seen by them have been made as of yet.    Patient Active Problem List   Diagnosis Date Noted  . History of seizure 12/30/2019  . Insomnia 12/30/2019  . Class 1 obesity due to excess calories with serious comorbidity and body mass index (BMI) of 34.0 to 34.9 in adult 12/30/2019  . Prediabetes 12/30/2019  . Gastroesophageal reflux disease without esophagitis 12/30/2019  . Mild intermittent asthma without complication 12/30/2019  . Seasonal allergic rhinitis due to pollen 12/30/2019  . Major depressive disorder, recurrent, severe without psychotic features (HCC)   . PTSD (post-traumatic stress disorder) 04/22/2013  . Cesarean delivery, without mention of indication, delivered, with or without mention of antepartum condition 05/11/2012      Current Outpatient Medications:  .  lamoTRIgine (LAMICTAL) 25 MG tablet, Wk1:1tab night wk2:1tab 2xday wk3:1tab AM 2tabsPM wk4:2tabs 2xday wk5:2tabs AM 3tabs PM wk6:3tabs 2xday wk7:3tabs AM 4tabs PM, wk8:4tab2xday, Disp: , Rfl:    Allergies  Allergen Reactions  . Latex Itching and Rash  . Penicillins Swelling    Has patient had a PCN reaction causing immediate rash,  facial/tongue/throat swelling, SOB or lightheadedness with hypotension: Unknown Has patient had a PCN reaction causing severe rash involving mucus membranes or skin necrosis: Unknown Has patient had a PCN reaction that required hospitalization: Unknown Has patient had a PCN reaction occurring within the last 10 years: Unknown If all of the above answers are "NO", then may proceed with Cephalosporin use.   . Ibuprofen Nausea Only  . Pineapple Itching  . Vicodin [Hydrocodone-Acetaminophen] Rash     Past Surgical History:  Procedure Laterality Date  . CESAREAN SECTION  05/08/2012   Procedure: CESAREAN SECTION;  Surgeon: Brock Bad, MD;  Location: [facility];  Service: Obstetrics;  Laterality: N/A;  Primary Cesarean Section Delivery Girl @ 1555, Apgars 9/9  . NO PAST SURGERIES       Family History  Problem Relation Age of Onset  . Diabetes Mother   .  Diabetes Maternal Aunt   . Diabetes Maternal Uncle   . Anesthesia problems Neg Hx      Social History   Tobacco Use  . Smoking status: Current Every Day Smoker    Packs/day: 1.00    Years: 14.00    Pack years: 14.00    Types: Cigarettes    Last attempt to quit: 11/08/2011    Years since quitting: 8.2  . Smokeless tobacco: Never Used  Substance Use Topics  . Alcohol use: No    With staff assistance, above reviewed with the patient today.  ROS: As per HPI, also positive for some insomnia concerns persisting, otherwise no specific complaints on a limited and focused system review   No results found for this or any previous visit (from the past 72 hour(s)).   PHQ2/9: Depression screen [facility] 2/9 01/26/2020 12/30/2019  Decreased Interest 2 1  Down, Depressed, Hopeless 2 3  PHQ - 2 Score 4 4  Altered sleeping 3 3  Tired, decreased energy 2 3  Change in appetite 2 2  Feeling bad or failure about yourself  2 3  Trouble concentrating 1 3  Moving slowly or fidgety/restless 1 0  Suicidal thoughts 0 1  PHQ-9 Score 15 19    Difficult doing work/chores Extremely dIfficult Very difficult   PHQ-2/9 Result reviewed  GAD 7 : Generalized Anxiety Score 01/26/2020 12/30/2019  Nervous, Anxious, on Edge 3 2  Control/stop worrying 3 2  Worry too much - different things 3 2  Trouble relaxing 3 2  Restless 3 2  Easily annoyed or irritable 3 2  Afraid - awful might happen 3 2  Total GAD 7 Score 21 14  Anxiety Difficulty Extremely difficult Very difficult    GAD7 reviewed  Fall Risk: Fall Risk  01/26/2020 12/30/2019  Falls in the past year? 0 0  Number falls in past yr: 0 0  Injury with Fall? 0 0      Objective:   Vitals:   01/26/20 0940  BP: 126/88  Pulse: 93  Resp: 16  Temp: 97.6 F (36.4 C)  TempSrc: Temporal  SpO2: 96%  Weight: 194 lb 8 oz (88.2 kg)  Height: 5\' 3"  (1.6 m)    Body mass index is 34.45 kg/m.  Physical Exam   NAD, masked HEENT - [place]/AT, sclera anicteric, PERRL, EOMI, conj - non-inj'ed, pharynx clear Neck - supple, no adenopathy, no rigidity,  Car - RRR without m/g/r Pulm- RR and effort normal at rest, CTA without wheeze or rales Abd - soft, NT diffusely, obese  Ext - no LE edema, Neuro/psychiatric - affect was not flat, appropriate with conversation, speech was not rapid  Alert and oriented  Grossly non-focal, no tongue deviation, no facial droop   Results for orders placed or performed during the hospital encounter of 12/31/19  Glucose, capillary  Result Value Ref Range   Glucose-Capillary 107 (H) 70 - 99 mg/dL   Comment 1 Notify RN    Comment 2 Document in Chart   Basic metabolic panel  Result Value Ref Range   Sodium 137 135 - 145 mmol/L   Potassium 4.0 3.5 - 5.1 mmol/L   Chloride 106 98 - 111 mmol/L   CO2 23 22 - 32 mmol/L   Glucose, Bld 105 (H) 70 - 99 mg/dL   BUN 9 6 - 20 mg/dL   Creatinine, Ser 01/02/20 0.44 - 1.00 mg/dL   Calcium 9.1 8.9 - 9.03 mg/dL   GFR calc non Af Amer >60 >  60 mL/min   GFR calc Af Amer >60 >60 mL/min   Anion gap 8 5 - 15  CBC  Result Value  Ref Range   WBC 10.1 4.0 - 10.5 K/uL   RBC 4.10 3.87 - 5.11 MIL/uL   Hemoglobin 13.8 12.0 - 15.0 g/dL   HCT 40.1 36.0 - 46.0 %   MCV 97.8 80.0 - 100.0 fL   MCH 33.7 26.0 - 34.0 pg   MCHC 34.4 30.0 - 36.0 g/dL   RDW 11.9 11.5 - 15.5 %   Platelets 323 150 - 400 K/uL   nRBC 0.0 0.0 - 0.2 %       Assessment & Plan:   1. Nonintractable episodic headache, unspecified headache type Educated patient on headache, and do feel she has migrainous characteristics of her headache. 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 She noted she was having before she started the Lamictal product, although has increased some since starting that. Doubt that is causative to her headaches. She has not tolerated NSAIDs due to her stomach, with a known reflux history. She also notes Tylenol has had no benefits. Did feel adding a triptan type product was a reasonable approach, as will be helpful if a migraine source, and Imitrex was prescribed. Await her response, and to keep her follow-up with neurology June 22, but would like to get their input further on her headaches, especially if not helped with the Imitrex. - SUMAtriptan (IMITREX) 25 MG tablet; Take 1 tablet (25 mg total) by mouth once for 1 dose. As needed for headache. May repeat in 2 hours if headache persists or recurs.  Dispense: 30 tablet; Refill: 1  2. History of bipolar disorder 3. Current moderate episode of major depressive disorder, unspecified whether recurrent ([place]) 4. Generalized anxiety disorder 5. PTSD (post-traumatic stress disorder) She did have a psychiatrist involved in the past, although not the more recent past and did put in a referral last visit as I do think getting them involved in gaining their input is necessary and will be helpful. She noted she was hesitant with going somewhere and talking to somebody, although was agreeable to potential online visits, and I did note they are an option presently. I  did provide her with a number to [facility], and asked that she contact them to set up a visit, and I did note to her that I did place a referral if needed to help as well. I emphasized the importance of this proceeding, and she agreed and was understanding of this. With her history, I would prefer to gain their input with potential medications to add at this time.  6. History of seizure Continue with neurology follow-up Currently on the Lamictal product.  7. Gastroesophageal reflux disease without esophagitis Not tolerating the NSAIDs, likely related to her reflux disease history.  8. Mixed hyperlipidemia Reviewed her lipid panel with her today from previous, and trying dietary modifications and exercise components to help, although the exercise has been limited more recently with her headache issues.  Keep her scheduled follow-up with [place] that was made after her last visit, and also with neurology, and should follow-up sooner as needed.        Towanda Malkin, MD 01/26/20 9:48 AM

## 2020-01-26 ENCOUNTER — Encounter: Payer: Self-pay | Admitting: Internal Medicine

## 2020-01-26 ENCOUNTER — Other Ambulatory Visit: Payer: Self-pay

## 2020-01-26 ENCOUNTER — Ambulatory Visit: Payer: Medicaid Other | Admitting: Internal Medicine

## 2020-01-26 VITALS — BP 126/88 | HR 93 | Temp 97.6°F | Resp 16 | Ht 63.0 in | Wt 194.5 lb

## 2020-01-26 DIAGNOSIS — F411 Generalized anxiety disorder: Secondary | ICD-10-CM | POA: Diagnosis not present

## 2020-01-26 DIAGNOSIS — Z8659 Personal history of other mental and behavioral disorders: Secondary | ICD-10-CM | POA: Diagnosis not present

## 2020-01-26 DIAGNOSIS — F321 Major depressive disorder, single episode, moderate: Secondary | ICD-10-CM | POA: Diagnosis not present

## 2020-01-26 DIAGNOSIS — K219 Gastro-esophageal reflux disease without esophagitis: Secondary | ICD-10-CM

## 2020-01-26 DIAGNOSIS — R519 Headache, unspecified: Secondary | ICD-10-CM | POA: Diagnosis not present

## 2020-01-26 DIAGNOSIS — Z87898 Personal history of other specified conditions: Secondary | ICD-10-CM

## 2020-01-26 DIAGNOSIS — E782 Mixed hyperlipidemia: Secondary | ICD-10-CM

## 2020-01-26 DIAGNOSIS — F4310 Post-traumatic stress disorder, unspecified: Secondary | ICD-10-CM

## 2020-01-26 MED ORDER — SUMATRIPTAN SUCCINATE 25 MG PO TABS: 25.0000 mg | ORAL_TABLET | Freq: Once | ORAL | 1 refills | Status: DC

## 2020-01-26 NOTE — Patient Instructions (Signed)
Please pick up the medicine from the pharmacy to help with your headaches.  Also please contact [facility] health services, phone [phone] to help with your anxiety and depression. I also had put a referral in for psychiatry if needed as well.  Also, please keep your follow-up with neurology on June 22, as will ask further input, especially if the medicines started or not helping with the headaches.

## 2020-01-27 ENCOUNTER — Encounter: Payer: Medicaid Other | Admitting: Family Medicine

## 2020-01-28 ENCOUNTER — Other Ambulatory Visit: Payer: Self-pay | Admitting: Internal Medicine

## 2020-01-28 ENCOUNTER — Other Ambulatory Visit: Payer: Self-pay | Admitting: Acute Care

## 2020-01-28 ENCOUNTER — Telehealth: Payer: Self-pay | Admitting: Internal Medicine

## 2020-01-28 DIAGNOSIS — R112 Nausea with vomiting, unspecified: Secondary | ICD-10-CM

## 2020-01-28 DIAGNOSIS — R519 Headache, unspecified: Secondary | ICD-10-CM

## 2020-01-28 DIAGNOSIS — R569 Unspecified convulsions: Secondary | ICD-10-CM

## 2020-01-28 MED ORDER — ZOLMITRIPTAN 2.5 MG PO TBDP: 2.5000 mg | ORAL_TABLET | ORAL | 2 refills | Status: DC | PRN

## 2020-01-28 MED ORDER — ONDANSETRON HCL 4 MG PO TABS: 4.0000 mg | ORAL_TABLET | Freq: Three times a day (TID) | ORAL | 1 refills | Status: DC | PRN

## 2020-01-28 NOTE — Telephone Encounter (Addendum)
Robin pharmacist with [facility] is calling and pt got sumatriptan 25 mg #9 on 01-26-2020 and patient needs PA for zolmitriptan 2.5 mg #9. Pt is on medicaid and needs PA for quanity. Pt saw dr Dorris Fetch on 01-26-2020

## 2020-01-28 NOTE — Telephone Encounter (Signed)
Per iniitial encounter, "Patient ci stating his wife is experiencing extreme nausea and vomiting while taking SUMAtriptan and wants to know if another medication can be prescribed for her headaches  preferred Pharmacy: [facility] located on [address] Phone: [phone]"; contacted pt regarding her symptoms; the pt says she has been taking the medication since 01/26/20; she had 3 episodes of nausea and stomach burning since she started the medication; the symptoms start about 30 min after taking the medication, and lasts about 1. 5 hours; the pt states that she feels like her headache is not getting better; she is seen by Dr Dorris Fetch, [facility]; she can be contacted at [phone]; will route to office for final dispsotion

## 2020-01-28 NOTE — Progress Notes (Signed)
Patient noted increased nausea and vomiting concerns with the Imitrex product. Felt reasonable to try a Zomig product, rapidly disintegrating tablet to use as needed for headaches. Also can use a Zofran product to help with the nausea and vomiting as needed. These medicines were prescribed. Also important to keep her follow-up with neurology on June 22 to gain their input and recommendations.

## 2020-01-28 NOTE — Telephone Encounter (Signed)
Patient ci stating his wife is experiencing extreme nausea and vomiting while taking SUMAtriptan and wants to know if another medication can be prescribed for her headaches     Preferred Pharmacy: [facility] located on [address]  Phone: [phone]

## 2020-01-28 NOTE — Telephone Encounter (Signed)
Can try a zomig product, as it is a dissolvable pill that she can place under her tongue prn for headaches and assess. I can also prescribe a medicine to help with nausea and vomiting in the short-term, a Zofran product. I will prescribe them to her pharmacy. She needs to keep her upcoming appointment with neurology on June 22 as well to get their help and recommendations. [facility]

## 2020-01-31 ENCOUNTER — Telehealth: Payer: Self-pay | Admitting: Internal Medicine

## 2020-01-31 NOTE — Telephone Encounter (Signed)
Refill request for Zolmitriptan, patient states the prescription needs a prior authorization in order for Medicaid to pay for it and it was sent to incorrect pharmacy. °  °Preferred Pharmacy-[id number] - [place], [place] - [address] [place] [address] °  °Phone -[phone] °

## 2020-01-31 NOTE — Telephone Encounter (Signed)
Refill request for Zolmitriptan, patient states the prescription needs a prior authorization in order for Medicaid to pay for it and it was sent to incorrect pharmacy.   Preferred Pharmacy-[id number] - [place], [place] - [address]   Phone [phone]

## 2020-02-01 NOTE — Telephone Encounter (Signed)
Prior auth form sent in to [facility], waiting approval

## 2020-02-09 ENCOUNTER — Ambulatory Visit: Admission: RE | Admit: 2020-02-09 | Payer: Medicaid Other | Source: Ambulatory Visit

## 2020-02-10 NOTE — Telephone Encounter (Signed)
Pt's husband called saying the pharmacy has still not heard anything about the PA for the headache medication Zomeg.  He would like to know if there is something else she can take.  She is still having the migrains  CB#  [phone]

## 2020-02-11 NOTE — Telephone Encounter (Signed)
Pt has medicaid and ins denied I have printed out preferred dug list so we can switch.

## 2020-03-15 ENCOUNTER — Encounter: Payer: Medicaid Other | Admitting: Family Medicine

## 2020-03-16 ENCOUNTER — Telehealth: Payer: Self-pay | Admitting: Internal Medicine

## 2020-03-16 NOTE — Telephone Encounter (Signed)
If the patient is pregnant do you recommend her holding any of her current medications?  She is currently taking:  Lamictal 25mg  Zofran 4mg  prn Imitrex 25mg  Zolmitriptan prn

## 2020-03-16 NOTE — Telephone Encounter (Signed)
I spoke with the patient and relayed information per Dr. Dorris Fetch. The patient will contact Neurology and Obgyn. She has not taken a pregnancy test yet to confirm if she is pregnant she believes it's too soon.

## 2020-03-16 NOTE — Telephone Encounter (Signed)
Pt called and asked to speak with dr. Dorris Fetch / Pt states she may be pregnant and wanted to ask if she should stop taking any of the medications she is currently prescribed / please call and advise

## 2020-03-16 NOTE — Telephone Encounter (Signed)
Please let patient know if she thinks she is pregnant, she can take an over-the-counter pregnancy test to help verify as they are very accurate, and if positive, she needs to follow-up with OB/GYN for prenatal care.  They are often a good resource with respect to medication use during pregnancy.  With respect to her medications, caution is advised with these medications, especially in the first trimester.  It is always a risk/benefit discussion as to whether they should be continued at that time. I did not start the Lamictal product, and do not think she has been seeing anybody recently for mental health needs despite my encouragement on a prior visit.  I have concerns with her just stopping the medicine without knowing details of when it was started, and if she can contact her mental health provider who started this, that would be helpful.  If not, we do need a follow-up visit to further discuss. Also, the other medicines for migraine and nausea/vomiting often related to that may have some safer alternatives to use during her pregnancy, and we would need to follow-up to discuss these options as well.  Thanks [facility]

## 2020-03-28 ENCOUNTER — Other Ambulatory Visit: Payer: Self-pay

## 2020-03-28 ENCOUNTER — Ambulatory Visit
Admission: RE | Admit: 2020-03-28 | Discharge: 2020-03-28 | Disposition: A | Discharge status: Home / Self Care | Payer: Medicaid Other | Source: Ambulatory Visit | Attending: Acute Care | Admitting: Acute Care

## 2020-03-28 DIAGNOSIS — R569 Unspecified convulsions: Secondary | ICD-10-CM | POA: Diagnosis present

## 2020-03-31 ENCOUNTER — Ambulatory Visit: Payer: Medicaid Other | Admitting: Internal Medicine

## 2020-04-03 ENCOUNTER — Other Ambulatory Visit: Payer: Self-pay

## 2020-04-03 ENCOUNTER — Encounter: Payer: Self-pay | Admitting: Family Medicine

## 2020-04-03 ENCOUNTER — Ambulatory Visit: Payer: Medicaid Other | Admitting: Family Medicine

## 2020-04-03 VITALS — HR 98 | Temp 99.3°F | Resp 18 | Ht 63.0 in | Wt 202.2 lb

## 2020-04-03 DIAGNOSIS — O219 Vomiting of pregnancy, unspecified: Secondary | ICD-10-CM | POA: Diagnosis not present

## 2020-04-03 DIAGNOSIS — Z3201 Encounter for pregnancy test, result positive: Secondary | ICD-10-CM | POA: Diagnosis not present

## 2020-04-03 LAB — POCT URINE PREGNANCY: Preg Test, Ur: POSITIVE — AB

## 2020-04-03 MED ORDER — DOXYLAMINE-PYRIDOXINE 10-10 MG PO TBEC: DELAYED_RELEASE_TABLET | ORAL | 2 refills | Status: DC

## 2020-04-03 NOTE — Patient Instructions (Signed)
Doxylamine 10 mg/pyridoxine 10 mg (Diclegis): Oral: Initial: Two tablets at bedtime on day 1 and 2; if symptoms persist, take 1 tablet in morning and 2 tablets at bedtime on day 3; if symptoms persist, may increase to 1 tablet in morning, 1 tablet mid-afternoon, and 2 tablets at bedtime on day 4 (maximum: doxylamine 40 mg/pyridoxine 40 mg (4 tablets) per day).     Morning Sickness  Morning sickness is when you feel sick to your stomach (nauseous) during pregnancy. You may feel sick to your stomach and throw up (vomit). You may feel sick in the morning, but you can feel this way at any time of day. Some women feel very sick to their stomach and cannot stop throwing up (hyperemesis gravidarum). Follow these instructions at home: Medicines  Take over-the-counter and prescription medicines only as told by your doctor. Do not take any medicines until you talk with your doctor about them first.  Taking multivitamins before getting pregnant can stop or lessen the harshness of morning sickness. Eating and drinking  Eat dry toast or crackers before getting out of bed.  Eat 5 or 6 small meals a day.  Eat dry and bland foods like rice and baked potatoes.  Do not eat greasy, fatty, or spicy foods.  Have someone cook for you if the smell of food causes you to feel sick or throw up.  If you feel sick to your stomach after taking prenatal vitamins, take them at night or with a snack.  Eat protein when you need a snack. Nuts, yogurt, and cheese are good choices.  Drink fluids throughout the day.  Try ginger ale made with real ginger, ginger tea made from fresh grated ginger, or ginger candies. General instructions  Do not use any products that have nicotine or tobacco in them, such as cigarettes and e-cigarettes. If you need help quitting, ask your doctor.  Use an air purifier to keep the air in your house free of smells.  Get lots of fresh air.  Try to avoid smells that make you feel  sick.  Try: ? Wearing a bracelet that is used for seasickness (acupressure wristband). ? Going to a doctor who puts thin needles into certain body points (acupuncture) to improve how you feel. Contact a doctor if:  You need medicine to feel better.  You feel dizzy or light-headed.  You are losing weight. Get help right away if:  You feel very sick to your stomach and cannot stop throwing up.  You pass out (faint).  You have very bad pain in your belly. Summary  Morning sickness is when you feel sick to your stomach (nauseous) during pregnancy.  You may feel sick in the morning, but you can feel this way at any time of day.  Making some changes to what you eat may help your symptoms go away. This information is not intended to replace advice given to you by your health care provider. Make sure you discuss any questions you have with your health care provider. Document Revised: 07/18/2017 Document Reviewed: 09/05/2016 Elsevier Patient Education  2020 [employer].

## 2020-04-03 NOTE — Progress Notes (Deleted)
Patient: Jenny Baldwin, Female    DOB: 1986/06/10, 34 y.o.   MRN: [id number] Towanda Malkin, MD Visit Date: 04/03/2020  Today's Provider: Delsa Grana, PA-C   No chief complaint on file.  Subjective:   Annual physical exam:  Jenny Baldwin is a 34 y.o. female who presents today for complete physical exam:  Exercise/Activity:  Diet/nutrition:  Sleep:   Pt wished to discuss acute complaints *** do routine f/up on chronic conditions today in addition to CPE. Advised pt of separate visit billing/coding  USPSTF grade A and B recommendations - reviewed and addressed today  Depression:  Phq 9 completed today by patient, was reviewed by me with patient in the room PHQ score is ***, pt feels *** PHQ 2/9 Scores 01/26/2020 12/30/2019  PHQ - 2 Score 4 4  PHQ- 9 Score 15 19   Depression screen [facility] 2/9 01/26/2020 12/30/2019  Decreased Interest 2 1  Down, Depressed, Hopeless 2 3  PHQ - 2 Score 4 4  Altered sleeping 3 3  Tired, decreased energy 2 3  Change in appetite 2 2  Feeling bad or failure about yourself  2 3  Trouble concentrating 1 3  Moving slowly or fidgety/restless 1 0  Suicidal thoughts 0 1  PHQ-9 Score 15 19  Difficult doing work/chores Extremely dIfficult Very difficult    Alcohol screening:   Immunizations and Health Maintenance: Health Maintenance  Topic Date Due  . COVID-19 Vaccine (1) Never done  . PAP SMEAR-Modifier  Never done  . INFLUENZA VACCINE  03/19/2020  . TETANUS/TDAP  05/10/2022  . Hepatitis C Screening  Completed  . HIV Screening  Completed     Hep C Screening: ***  STD testing and prevention (HIV/chl/gon/syphilis):  see above, no additional testing desired by pt today  Intimate partner violence:***  Sexual History/Pain during Intercourse: Married  Menstrual History/LMP/Abnormal Bleeding: *** No LMP recorded.  Incontinence Symptoms: ***  Breast cancer:  Last Mammogram: *see HM list above BRCA gene screening: ***  Cervical  cancer screening: *** Pt *** family hx of cancers - breast, ovarian, uterine, colon:     Osteoporosis:   Discussion on osteoporosis per age, including high calcium and vitamin D supplementation, weight bearing exercises Pt is *** supplementing with daily calcium/Vit D. *** Bone scan/dexa Roughly experienced menopause at age ***  Skin cancer:  Hx of skin CA -  NO*** Discussed atypical lesions   Colorectal cancer:   Colonoscopy is ***   Discussed concerning signs and sx of CRC, pt denies ***  Lung cancer:   Low Dose CT Chest recommended if Age 85-80 years, 30 pack-year currently smoking OR have quit w/in 15years. Patient {DOES NOT does:27190::"does not"} qualify.    Social History   Tobacco Use  . Smoking status: Current Every Day Smoker    Packs/day: 1.00    Years: 14.00    Pack years: 14.00    Types: Cigarettes    Last attempt to quit: 11/08/2011    Years since quitting: 8.4  . Smokeless tobacco: Never Used  Vaping Use  . Vaping Use: Never used  Substance Use Topics  . Alcohol use: No  . Drug use: No       Family History  Problem Relation Age of Onset  . Diabetes Mother   . Diabetes Maternal Aunt   . Diabetes Maternal Uncle   . Anesthesia problems Neg Hx      Blood pressure/Hypertension: BP Readings from Last 3 Encounters:  01/26/20  126/88  12/31/19 (!) 139/99  12/30/19 124/88    Weight/Obesity: Wt Readings from Last 3 Encounters:  01/26/20 194 lb 8 oz (88.2 kg)  12/31/19 194 lb (88 kg)  12/30/19 194 lb 3.2 oz (88.1 kg)   BMI Readings from Last 3 Encounters:  01/26/20 34.45 kg/m  12/31/19 35.48 kg/m  12/30/19 34.68 kg/m     Lipids:  Lab Results  Component Value Date   CHOL 242 (H) 12/30/2019   Lab Results  Component Value Date   HDL 44 (L) 12/30/2019   Lab Results  Component Value Date   LDLCALC 155 (H) 12/30/2019   Lab Results  Component Value Date   TRIG 265 (H) 12/30/2019   Lab Results  Component Value Date   CHOLHDL 5.5 (H)  12/30/2019   No results found for: LDLDIRECT Based on the results of lipid panel his/her cardiovascular risk factor ( using [employer] )  in the next 10 years is: The ASCVD Risk score Mikey Bussing DC Jr., et al., 2013) failed to calculate for the following reasons:   The 2013 ASCVD risk score is only valid for ages 68 to 23 Glucose:  Glucose, Bld  Date Value Ref Range Status  12/31/2019 105 (H) 70 - 99 mg/dL Final    Comment:    Glucose reference range applies only to samples taken after fasting for at least 8 hours.  12/30/2019 94 65 - 99 mg/dL Final    Comment:    .            Fasting reference interval .   12/10/2019 112 (H) 70 - 99 mg/dL Final    Comment:    Glucose reference range applies only to samples taken after fasting for at least 8 hours.   Glucose-Capillary  Date Value Ref Range Status  12/31/2019 107 (H) 70 - 99 mg/dL Final    Comment:    Glucose reference range applies only to samples taken after fasting for at least 8 hours.  08/14/2017 97 65 - 99 mg/dL Final  02/02/2017 102 (H) 65 - 99 mg/dL Final   Hypertension: BP Readings from Last 3 Encounters:  01/26/20 126/88  12/31/19 (!) 139/99  12/30/19 124/88   Obesity: Wt Readings from Last 3 Encounters:  01/26/20 194 lb 8 oz (88.2 kg)  12/31/19 194 lb (88 kg)  12/30/19 194 lb 3.2 oz (88.1 kg)   BMI Readings from Last 3 Encounters:  01/26/20 34.45 kg/m  12/31/19 35.48 kg/m  12/30/19 34.68 kg/m      Advanced Care Planning:  A voluntary discussion about advance care planning including the explanation and discussion of advance directives.   Discussed health care proxy and Living will, and the patient was able to identify a health care proxy as ***.   Patient {DOES_DOES EVO:35009} have a living will at present time.   Social History      She        Social History   Socioeconomic History  . Marital status: Married    Spouse name: Not on file  . Number of children: 6  . Years of education: Not on file   . Highest education level: 11th grade  Occupational History  . Not on file  Tobacco Use  . Smoking status: Current Every Day Smoker    Packs/day: 1.00    Years: 14.00    Pack years: 14.00    Types: Cigarettes    Last attempt to quit: 11/08/2011    Years since quitting: 8.4  .  Smokeless tobacco: Never Used  Vaping Use  . Vaping Use: Never used  Substance and Sexual Activity  . Alcohol use: No  . Drug use: No  . Sexual activity: Not Currently    Birth control/protection: None  Other Topics Concern  . Not on file  Social History Narrative  . Not on file   Social Determinants of Health   Financial Resource Strain:   . Difficulty of Paying Living Expenses:   Food Insecurity:   . Worried About Charity fundraiser in the Last Year:   . Arboriculturist in the Last Year:   Transportation Needs:   . Film/video editor (Medical):   Marland Kitchen Lack of Transportation (Non-Medical):   Physical Activity:   . Days of Exercise per Week:   . Minutes of Exercise per Session:   Stress:   . Feeling of Stress :   Social Connections:   . Frequency of Communication with Friends and Family:   . Frequency of Social Gatherings with Friends and Family:   . Attends Religious Services:   . Active Member of Clubs or Organizations:   . Attends Archivist Meetings:   Marland Kitchen Marital Status:     Family History        Family History  Problem Relation Age of Onset  . Diabetes Mother   . Diabetes Maternal Aunt   . Diabetes Maternal Uncle   . Anesthesia problems Neg Hx     Patient Active Problem List   Diagnosis Date Noted  . Nausea and vomiting 01/28/2020  . Nonintractable episodic headache 01/26/2020  . History of bipolar disorder 01/26/2020  . Current moderate episode of major depressive disorder ([place]) 01/26/2020  . Mixed hyperlipidemia 01/26/2020  . Generalized anxiety disorder 01/26/2020  . History of seizure 12/30/2019  . Insomnia 12/30/2019  . Class 1 obesity due to excess  calories with serious comorbidity and body mass index (BMI) of 34.0 to 34.9 in adult 12/30/2019  . Prediabetes 12/30/2019  . Gastroesophageal reflux disease without esophagitis 12/30/2019  . Mild intermittent asthma without complication 32/95/1884  . Seasonal allergic rhinitis due to pollen 12/30/2019  . PTSD (post-traumatic stress disorder) 04/22/2013  . Cesarean delivery, without mention of indication, delivered, with or without mention of antepartum condition 05/11/2012    Past Surgical History:  Procedure Laterality Date  . CESAREAN SECTION  05/08/2012   Procedure: CESAREAN SECTION;  Surgeon: Shelly Bombard, MD;  Location: [facility];  Service: Obstetrics;  Laterality: N/A;  Primary Cesarean Section Delivery Girl @ 1660, Apgars 9/9  . NO PAST SURGERIES       Current Outpatient Medications:  .  lamoTRIgine (LAMICTAL) 25 MG tablet, Wk1:1tab night wk2:1tab 2xday wk3:1tab AM 2tabsPM wk4:2tabs 2xday wk5:2tabs AM 3tabs PM wk6:3tabs 2xday wk7:3tabs AM 4tabs PM, wk8:4tab2xday, Disp: , Rfl:  .  ondansetron (ZOFRAN) 4 MG tablet, Take 1 tablet (4 mg total) by mouth every 8 (eight) hours as needed for nausea or vomiting., Disp: 20 tablet, Rfl: 1 .  SUMAtriptan (IMITREX) 25 MG tablet, Take 1 tablet (25 mg total) by mouth once for 1 dose. As needed for headache. May repeat in 2 hours if headache persists or recurs., Disp: 30 tablet, Rfl: 1 .  zolmitriptan (ZOMIG-ZMT) 2.5 MG disintegrating tablet, Take 1 tablet (2.5 mg total) by mouth as needed for migraine. May repeat dose X one after 2 hours prn, Disp: 9 tablet, Rfl: 2  Allergies  Allergen Reactions  . Latex Itching and Rash  .  Penicillins Swelling    Has patient had a PCN reaction causing immediate rash, facial/tongue/throat swelling, SOB or lightheadedness with hypotension: Unknown Has patient had a PCN reaction causing severe rash involving mucus membranes or skin necrosis: Unknown Has patient had a PCN reaction that required hospitalization:  Unknown Has patient had a PCN reaction occurring within the last 10 years: Unknown If all of the above answers are "NO", then may proceed with Cephalosporin use.   . Ibuprofen Nausea Only  . Pineapple Itching  . Vicodin [Hydrocodone-Acetaminophen] Rash    Patient Care Team: Towanda Malkin, MD as PCP - General (Internal Medicine)  Review of Systems   ***       Objective:   Vitals:  There were no vitals filed for this visit.  There is no height or weight on file to calculate BMI.  Physical Exam    Fall Risk: Fall Risk  01/26/2020 12/30/2019  Falls in the past year? 0 0  Number falls in past yr: 0 0  Injury with Fall? 0 0    Functional Status Survey:     Assessment & Plan:    CPE completed today  . USPSTF grade A and B recommendations reviewed with patient; age-appropriate recommendations, preventive care, screening tests, etc discussed and encouraged; healthy living encouraged; see AVS for patient education given to patient  . Discussed importance of 150 minutes of physical activity weekly, AHA exercise recommendations given to pt in AVS/handout  . Discussed importance of healthy diet:  eating lean meats and proteins, avoiding trans fats and saturated fats, avoid simple sugars and excessive carbs in diet, eat 6 servings of fruit/vegetables daily and drink plenty of water and avoid sweet beverages.    . Recommended pt to do annual eye exam and routine dental exams/cleanings  . Depression, alcohol, fall screening completed as documented above and per flowsheets  . Reviewed Health Maintenance: Health Maintenance  Topic Date Due  . COVID-19 Vaccine (1) Never done  . PAP SMEAR-Modifier  Never done  . INFLUENZA VACCINE  03/19/2020  . TETANUS/TDAP  05/10/2022  . Hepatitis C Screening  Completed  . HIV Screening  Completed    . Immunizations: Immunization History  Administered Date(s) Administered  . Pneumococcal Polysaccharide-23 05/09/2012, 11/21/2012    . Tdap 05/10/2012    ***  No orders of the defined types were placed in this encounter.       Delsa Grana, PA-C 04/03/20 11:47 AM  [facility]

## 2020-04-03 NOTE — Progress Notes (Signed)
Patient ID: Jenny Baldwin, female    DOB: 12/09/1985, 34 y.o.   MRN: [id number]  PCP: Jamelle Haring, MD  Chief Complaint  Patient presents with  . Annual Exam    Subjective:   Jenny Baldwin is a 34 y.o. female, presents to clinic with CC of the following:  Here for CPE and pap, but she does not want to do today.  She presents instead for suspected pregnancy.  LMP is noted as 03/07/2020, but she states it was not normal for her, and a few days after that she took a pregnancy test and it was positive.  She has take about 6 more home pregnancy tests since.  She does not remember her LMP prior to July.  She was not trying to get pregnant.  This would be her 7th pregnancy/child.  She does not have an OBGYN in the area.  She has hx of C-section. She has seizures and is on medication - seeing [facility].  She endorses some nausea, she it taking zofran for N/V. No vaginal bleeding, no abdominal cramping.  Urine preg here today is positive  Results for orders placed or performed in visit on 04/03/20  POCT urine pregnancy  Result Value Ref Range   Preg Test, Ur Positive (A) Negative      Patient Active Problem List   Diagnosis Date Noted  . Nausea and vomiting 01/28/2020  . Nonintractable episodic headache 01/26/2020  . History of bipolar disorder 01/26/2020  . 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 Mixed hyperlipidemia 01/26/2020  . Generalized anxiety disorder 01/26/2020  . History of seizure 12/30/2019  . Insomnia 12/30/2019  . Class 1 obesity due to excess calories with serious comorbidity and body mass index (BMI) of 34.0 to 34.9 in adult 12/30/2019  . Prediabetes 12/30/2019  . Gastroesophageal reflux disease without esophagitis 12/30/2019  . Mild intermittent asthma without complication 12/30/2019  . Seasonal allergic rhinitis due to pollen 12/30/2019  . PTSD (post-traumatic stress disorder) 04/22/2013  . Cesarean delivery,  without mention of indication, delivered, with or without mention of antepartum condition 05/11/2012      Current Outpatient Medications:  .  lamoTRIgine (LAMICTAL) 25 MG tablet, Wk1:1tab night wk2:1tab 2xday wk3:1tab AM 2tabsPM wk4:2tabs 2xday wk5:2tabs AM 3tabs PM wk6:3tabs 2xday wk7:3tabs AM 4tabs PM, wk8:4tab2xday, Disp: , Rfl:  .  ondansetron (ZOFRAN) 4 MG tablet, Take 1 tablet (4 mg total) by mouth every 8 (eight) hours as needed for nausea or vomiting. (Patient not taking: Reported on 04/03/2020), Disp: 20 tablet, Rfl: 1 .  SUMAtriptan (IMITREX) 25 MG tablet, Take 1 tablet (25 mg total) by mouth once for 1 dose. As needed for headache. May repeat in 2 hours if headache persists or recurs., Disp: 30 tablet, Rfl: 1 .  zolmitriptan (ZOMIG-ZMT) 2.5 MG disintegrating tablet, Take 1 tablet (2.5 mg total) by mouth as needed for migraine. May repeat dose X one after 2 hours prn (Patient not taking: Reported on 04/03/2020), Disp: 9 tablet, Rfl: 2   Allergies  Allergen Reactions  . Latex Itching and Rash  . Penicillins Swelling    Has patient had a PCN reaction causing immediate rash, facial/tongue/throat swelling, SOB or lightheadedness with hypotension: Unknown Has patient had a PCN reaction causing severe rash involving mucus membranes or skin necrosis: Unknown Has patient had a PCN reaction that required hospitalization: Unknown Has patient had a PCN reaction occurring within the last 10 years: Unknown If all of the  above answers are "NO", then may proceed with Cephalosporin use.   . Ibuprofen Nausea Only  . Pineapple Itching  . Vicodin [Hydrocodone-Acetaminophen] Rash     Social History   Tobacco Use  . Smoking status: Current Every Day Smoker    Packs/day: 1.00    Years: 14.00    Pack years: 14.00    Types: Cigarettes    Last attempt to quit: 11/08/2011    Years since quitting: 8.4  . Smokeless tobacco: Never Used  Vaping Use  . Vaping Use: Never used  Substance Use Topics    . Alcohol use: No  . Drug use: No      Chart Review Today: I personally reviewed active problem list, medication list, allergies, family history, social history, health maintenance, notes from last encounter, lab results, imaging with the patient/caregiver today.   Review of Systems 10 Systems reviewed and are negative for acute change except as noted in the HPI.     Objective:   Vitals:   04/03/20 1154  Pulse: 98  Resp: 18  Temp: 99.3 F (37.4 C)  TempSrc: Oral  SpO2: 99%  Weight: 202 lb 3.2 oz (91.7 kg)  Height: 5\' 3"  (1.6 m)    Body mass index is 35.82 kg/m.  Physical Exam Vitals and nursing note reviewed.  Constitutional:      General: She is not in acute distress.    Appearance: She is obese. She is not ill-appearing, toxic-appearing or diaphoretic.  HENT:     Head: Normocephalic and atraumatic.     Right Ear: External ear normal.     Left Ear: External ear normal.  Eyes:     General: No scleral icterus.       Right eye: No discharge.        Left eye: No discharge.     Conjunctiva/sclera: Conjunctivae normal.  Cardiovascular:     Rate and Rhythm: Normal rate and regular rhythm.     Pulses: Normal pulses.     Heart sounds: Normal heart sounds.  Pulmonary:     Effort: Pulmonary effort is normal.     Breath sounds: Normal breath sounds.  Abdominal:     General: Bowel sounds are normal.     Palpations: Abdomen is soft.  Skin:    General: Skin is warm and dry.     Coloration: Skin is not jaundiced or pale.  Neurological:     Mental Status: She is alert.  Psychiatric:        Mood and Affect: Mood normal.        Behavior: Behavior normal.      Results for orders placed or performed in visit on 04/03/20  POCT urine pregnancy  Result Value Ref Range   Preg Test, Ur Positive (A) Negative       Assessment & Plan:     ICD-10-CM   1. Positive pregnancy test  Z32.01 POCT urine pregnancy    Ambulatory referral to Obstetrics / Gynecology   + urine  preg, LMP noted 7/20, suspect that was some spotting with implantation, no other LMP date available - she states March? - dating per April  2. Nausea and vomiting during pregnancy prior to [redacted] weeks gestation  O21.9    diclegis - dosing and instructions given   Urgent referral to OBGYN, pt seeing neurology this week- Pt will need dating per [place] with unclear dates High risk due to age, smoking, seizure disorder and medications Encouraged to start prenatal vitamins, avoid NSAIDs,  stay hydrated.    Danelle Berry, PA-C 04/03/20 12:13 PM

## 2020-04-06 ENCOUNTER — Encounter: Payer: Self-pay | Admitting: Emergency Medicine

## 2020-04-06 ENCOUNTER — Ambulatory Visit: Payer: Self-pay

## 2020-04-06 ENCOUNTER — Emergency Department
Admission: EM | Admit: 2020-04-06 | Discharge: 2020-04-06 | Disposition: A | Discharge status: Home / Self Care | Payer: Medicaid Other | Attending: Emergency Medicine | Admitting: Emergency Medicine

## 2020-04-06 ENCOUNTER — Other Ambulatory Visit: Payer: Self-pay

## 2020-04-06 DIAGNOSIS — R109 Unspecified abdominal pain: Secondary | ICD-10-CM | POA: Insufficient documentation

## 2020-04-06 DIAGNOSIS — O26899 Other specified pregnancy related conditions, unspecified trimester: Secondary | ICD-10-CM | POA: Insufficient documentation

## 2020-04-06 DIAGNOSIS — O209 Hemorrhage in early pregnancy, unspecified: Secondary | ICD-10-CM | POA: Insufficient documentation

## 2020-04-06 DIAGNOSIS — Z5321 Procedure and treatment not carried out due to patient leaving prior to being seen by health care provider: Secondary | ICD-10-CM | POA: Insufficient documentation

## 2020-04-06 DIAGNOSIS — Z3A00 Weeks of gestation of pregnancy not specified: Secondary | ICD-10-CM | POA: Insufficient documentation

## 2020-04-06 NOTE — ED Triage Notes (Signed)
Pt reports is a month pregnant and started with vaginal bleeding and abd cramping that has gotten worse. Pt tearful, concerned she is miscarrying

## 2020-04-06 NOTE — ED Notes (Signed)
Pt called for VS and labs, pt husband reported they were leaving to follow up elsewhere

## 2020-04-06 NOTE — Telephone Encounter (Signed)
Patients husband called stating that his wife is 1 month pregnant and is passing blood and dime size clots with each urination. She states that she has seen grey white discharge. Her pain is on the right side.  She has started to feel lightheaded. Pregnancy was Dx at office and she has appointment scheduled with OBGYN  Per protocol patient will go to ER for evaluation  Care advice was read to patient husband  She is with him and answering questions.   Reason for Disposition . Passed tissue (e.g., gray-white)  Answer Assessment - Initial Assessment Questions 1. ONSET: "When did this bleeding start?"       yesterday 2. DESCRIPTION: "Describe the bleeding that you are having." "How much bleeding is there?"    - SPOTTING: spotting, or pinkish / brownish mucous discharge; does not fill panti-liner or pad    - MILD:  less than 1 pad / hour; less than patient's usual menstrual bleeding   - MODERATE: 1-2 pads / hour; 1 menstrual cup every 6 hours; small-medium blood clots (e.g., pea, grape, small coin)   - SEVERE: soaking 2 or more pads/hour for 2 or more hours; 1 menstrual cup every 2 hours; bleeding not contained by pads or continuous red blood from vagina; large blood clots (e.g., golf ball, large coin)      *No Answer*clotsABDOMINAL PAIN SEVERITY: If present, ask: "How bad is it?"  (e.g., Scale 1-10; mild, moderate, or severe)   - MILD (1-3): doesn't interfere with normal activities, abdomen soft and not tender to touch    - MODERATE (4-7): interferes with normal activities or awakens from sleep, tender to touch    - SEVERE (8-10): excruciating pain, doubled over, unable to do any normal activities     Rt side only 4. PREGNANCY: "Do you know how many weeks or months pregnant you are?" "When was the first day of your last normal menstrual period?"     1 month 3 days 5. HEMODYNAMIC STATUS: "Are you weak or feeling lightheaded?" If Yes, ask: "Can you stand and walk normally?"      Feeling light  headed 6. OTHER SYMPTOMS: "What other symptoms are you having with the bleeding?" (e.g., passed tissue, vaginal discharge, fever, menstrual-type cramps)    Passing clots dime clots  Protocols used: PREGNANCY - VAGINAL BLEEDING LESS THAN [redacted] WEEKS EGA-A-AH

## 2020-04-17 ENCOUNTER — Other Ambulatory Visit: Payer: Self-pay

## 2020-04-17 ENCOUNTER — Encounter: Payer: Self-pay | Admitting: Obstetrics

## 2020-04-17 ENCOUNTER — Ambulatory Visit: Payer: Medicaid Other | Admitting: Obstetrics

## 2020-04-17 ENCOUNTER — Ambulatory Visit (INDEPENDENT_AMBULATORY_CARE_PROVIDER_SITE_OTHER): Payer: Medicaid Other | Admitting: Obstetrics

## 2020-04-17 VITALS — BP 120/80 | HR 90 | Ht 62.0 in | Wt 200.0 lb

## 2020-04-17 DIAGNOSIS — O039 Complete or unspecified spontaneous abortion without complication: Secondary | ICD-10-CM

## 2020-04-17 DIAGNOSIS — N939 Abnormal uterine and vaginal bleeding, unspecified: Secondary | ICD-10-CM | POA: Diagnosis not present

## 2020-04-17 LAB — POCT URINE PREGNANCY: Preg Test, Ur: NEGATIVE

## 2020-04-17 NOTE — Progress Notes (Signed)
Obstetrics & Gynecology Office Visit   Chief Complaint:  Chief Complaint  Patient presents with  . Gynecologic Exam    Initially sched for NOB; Positive urine preg test on 8/16; Negative blood preg on 8/19; pt states she had heavy bleeding c clot 8/19 for 3d;  n/v; breast tenderness;     History of Present Illness: Jenny Baldwin presents for follow up on a suspected unplanned pregnancy. She shares that on 04/03/2020 she performed numerous home urine pregnancy tests that were positive. She made an appointment at a PCP office where the pregnancy was confirmed. Several days late, on 8/19, she began bleeding vaginally and passing dime sized lots. She was then seen at the [facility] ED in [place] where a quantitative HCG were drawn, and an ultrasound was performed- both indicated no pregnancy. She is here with her partner for follow up and to ask questions.  She states, "I am confused"   Review of Systems:  Review of Systems  Constitutional: Negative.   HENT: Negative.   Eyes: Negative.   Respiratory: Positive for cough.   Cardiovascular: Negative.   Gastrointestinal: Negative.   Genitourinary: Negative.   Musculoskeletal: Negative.   Skin: Negative.   Neurological: Negative.   Endo/Heme/Allergies: Negative.   Psychiatric/Behavioral: Positive for depression.     Past Medical History:  Past Medical History:  Diagnosis Date  . Acid reflux   . Asthma   . Bipolar 1 disorder (HCC)   . Chronic right hip pain   . Manic depression (HCC)   . OCD (obsessive compulsive disorder)   . Prediabetes   . PTSD (post-traumatic stress disorder)   . Recurrent UTI    with pregnancy  . SVD (spontaneous vaginal delivery)    x 5    Past Surgical History:  Past Surgical History:  Procedure Laterality Date  . CESAREAN SECTION  05/08/2012   Procedure: CESAREAN SECTION;  Surgeon: Brock Bad, MD;  Location: [facility];  Service: Obstetrics;  Laterality: N/A;  Primary Cesarean Section Delivery Girl @ 1555,  Apgars 9/9  . NO PAST SURGERIES      Gynecologic History: Patient's last menstrual period was 03/07/2020 (exact date).  Obstetric History: [id number]  Family History:  Family History  Problem Relation Age of Onset  . Diabetes Mother   . Diabetes Maternal Aunt   . Diabetes Maternal Uncle   . Cancer Maternal Grandmother 50       unknown - made eyes yellow  . Anesthesia problems Neg Hx     Social History:  Social History   Socioeconomic History  . Marital status: Married    Spouse name: Bernette Redbird  . Number of children: 6  . Years of education: Not on file  . Highest education level: 11th grade  Occupational History  . Not on file  Tobacco Use  . Smoking status: Current Every Day Smoker    Packs/day: 1.00    Years: 14.00    Pack years: 14.00    Types: Cigarettes    Last attempt to quit: 11/08/2011    Years since quitting: 8.4  . Smokeless tobacco: Never Used  Vaping Use  . Vaping Use: Never used  Substance and Sexual Activity  . Alcohol use: No  . Drug use: No  . Sexual activity: Yes    Partners: Male    Birth control/protection: None  Other Topics Concern  . Not on file  Social History Narrative  . Not on file   Social Determinants of Health  Financial Resource Strain:   . Difficulty of Paying Living Expenses: Not on file  Food Insecurity:   . Worried About Programme researcher, broadcasting/film/video in the Last Year: Not on file  . Ran Out of Food in the Last Year: Not on file  Transportation Needs:   . Lack of Transportation (Medical): Not on file  . Lack of Transportation (Non-Medical): Not on file  Physical Activity:   . Days of Exercise per Week: Not on file  . Minutes of Exercise per Session: Not on file  Stress:   . Feeling of Stress : Not on file  Social Connections:   . Frequency of Communication with Friends and Family: Not on file  . Frequency of Social Gatherings with Friends and Family: Not on file  . Attends Religious Services: Not on file  . Active Member of Clubs  or Organizations: Not on file  . Attends Banker Meetings: Not on file  . Marital Status: Not on file  Intimate Partner Violence:   . Fear of Current or Ex-Partner: Not on file  . Emotionally Abused: Not on file  . Physically Abused: Not on file  . Sexually Abused: Not on file    Allergies:  Allergies  Allergen Reactions  . Latex Itching and Rash  . Penicillins Swelling    Has patient had a PCN reaction causing immediate rash, facial/tongue/throat swelling, SOB or lightheadedness with hypotension: Unknown Has patient had a PCN reaction causing severe rash involving mucus membranes or skin necrosis: Unknown Has patient had a PCN reaction that required hospitalization: Unknown Has patient had a PCN reaction occurring within the last 10 years: Unknown If all of the above answers are "NO", then may proceed with Cephalosporin use.   . Ibuprofen Nausea Only  . Levetiracetam Other (See Comments)    Severe mood changes.   . Pineapple Itching  . Vicodin [Hydrocodone-Acetaminophen] Rash    Medications: Prior to Admission medications   Medication Sig Start Date End Date Taking? Authorizing Provider  Doxylamine-Pyridoxine 10-10 MG TBEC Take one pill po at bedtime for nausea, increase daily to 2 pills at bedtime, one at lunch and one in the morning if having persistent N/V 04/03/20  Yes Danelle Berry, PA-C  lamoTRIgine (LAMICTAL) 25 MG tablet Wk1:1tab night wk2:1tab 2xday wk3:1tab AM 2tabsPM wk4:2tabs 2xday wk5:2tabs AM 3tabs PM wk6:3tabs 2xday wk7:3tabs AM 4tabs PM, wk8:4tab2xday 01/07/20  Yes [provider]  Prenatal Vit-Fe Fumarate-FA (PRENATAL VITAMINS) 28-0.8 MG TABS Take 1 tablet by mouth daily.   Yes [provider]  ondansetron (ZOFRAN) 4 MG tablet Take 1 tablet (4 mg total) by mouth every 8 (eight) hours as needed for nausea or vomiting. Patient not taking: Reported on 04/03/2020 01/28/20   Jamelle Haring, MD  promethazine (PHENERGAN) 25 MG tablet  Take 1 tablet by mouth as needed. Patient not taking: Reported on 04/17/2020 01/18/20   [provider]  zolmitriptan (ZOMIG-ZMT) 2.5 MG disintegrating tablet Take 1 tablet (2.5 mg total) by mouth as needed for migraine. May repeat dose X one after 2 hours prn Patient not taking: Reported on 04/03/2020 01/28/20   Jamelle Haring, MD    Physical Exam Vitals:  Vitals:   04/17/20 1048  BP: 120/80  Pulse: 90   Patient's last menstrual period was 03/07/2020 (exact date).  Physical Exam Genitourinary:    General: Normal vulva.     Exam position: Lithotomy position.     Tanner stage (genital): 4.     Comments: No  vaginal bleeding noted. Normal appearing cervix, vaginal mucosa and rugae.     Assessment: 34 y.o. [id number] No problem-specific Assessment & Plan notes found for this encounter.   Plan: Problem List Items Addressed This Visit      Other   SAB (spontaneous abortion)    Other Visit Diagnoses    Vaginal bleeding    -  Primary   Relevant Orders   Beta hCG quant (ref lab)   POCT urine pregnancy (Completed)     Urine pregnancy test is negative. Quant HCG pending.  P; Reviewed the early pregnancy testing and the subsequent vaginal bleeding and negative testing with ultrasound that confirmed her SAB. Quant drawn today as follow up. Reassured that approx 20% of pregnancies are miscarried.   Also discussed the importance of planned pregnancies for this patient as she is addressing her seizure disorder and other issues with pharmceuticals, and she understands that it is important to avoid certain meds should she seek a pregnancy. Overdue for an annual Well Woman exam, and need for contraceptive plan. Advised her to make an appointment for an annual and to discuss birth control. Due to her hx, she would need a progesterone contraception. She has tried IUDs, the Nexplanon, and Depo, and been dissatisfied with them, largely due to irregular bleeding. Will discuss further  at her next appointment.  We did discuss the use of POPs briefly. Mirna Mires, CNM  04/17/2020 1:06 PM

## 2020-04-18 DIAGNOSIS — R404 Transient alteration of awareness: Secondary | ICD-10-CM | POA: Insufficient documentation

## 2020-04-18 LAB — BETA HCG QUANT (REF LAB): hCG Quant: <1 m[IU]/mL

## 2020-05-03 ENCOUNTER — Telehealth (INDEPENDENT_AMBULATORY_CARE_PROVIDER_SITE_OTHER): Payer: Medicaid Other | Admitting: Internal Medicine

## 2020-05-03 ENCOUNTER — Encounter: Payer: Self-pay | Admitting: Internal Medicine

## 2020-05-03 ENCOUNTER — Other Ambulatory Visit: Payer: Self-pay

## 2020-05-03 ENCOUNTER — Telehealth: Payer: Self-pay

## 2020-05-03 DIAGNOSIS — J4520 Mild intermittent asthma, uncomplicated: Secondary | ICD-10-CM

## 2020-05-03 DIAGNOSIS — R531 Weakness: Secondary | ICD-10-CM | POA: Diagnosis not present

## 2020-05-03 DIAGNOSIS — U071 COVID-19: Secondary | ICD-10-CM | POA: Diagnosis not present

## 2020-05-03 DIAGNOSIS — E6609 Other obesity due to excess calories: Secondary | ICD-10-CM | POA: Diagnosis not present

## 2020-05-03 DIAGNOSIS — Z6834 Body mass index (BMI) 34.0-34.9, adult: Secondary | ICD-10-CM

## 2020-05-03 NOTE — Telephone Encounter (Signed)
Per Dr. Sunday Corn request patient's information was given to [facility] voicemail. They will contact the patient to get her scheduled.

## 2020-05-03 NOTE — Progress Notes (Signed)
Name: Jenny Baldwin   MRN: [id number]    DOB: 14-Feb-1986   Date:05/03/2020       Progress Note  Subjective  Chief Complaint  Chief Complaint  Patient presents with  . Covid Positive    legs feel very weak, positve 4 days ago, no taste or smell  . Nausea  . Headache    I connected with  [name] on 05/03/20 at  2:40 PM EDT by telephone and verified that I am speaking with the correct person using two identifiers.  I discussed the limitations, risks, security and privacy concerns of performing an evaluation and management service by telephone and the availability of in person appointments. The patient expressed understanding and agreed to proceed. Staff also discussed with the patient that there may be a patient responsible charge related to this service. Patient Location: Home Provider Location: [facility] Additional Individuals present: none  HPI Patient is a 34 year old female Follows up today by phone with recent positive Covid test noted.  Patient with Covid, she noted test result was positive 3 days ago, on 9/12, at urgent care Sx's started day prior    Notes symptoms that persist include: + cough, no production No marked SOB, not problematic when laying down, notes cannot get up and about due to legs  No fever, feeling feverish with cold and sweats + mild sore throat thinks from coughing and sinus and chest congestion + HA's, light bothersome + loss of smell, loss of taste + N/min V a couple days ago, not since + muscle aches + fatigue and arms and legs feel weak, when does get up, feels like going to fall, has not fallen Trying to stay hydrated with pedialyte and gatorade + loose stools/diarrhea - only twice a day at most and loose, and then hemorrhoids swell up and start to bleed No CP, passing out episodes Positive tobacco use  Tried anti-diarrheal med, tylenol  Comorbid conditions reviewed  States has asthma - on a powder inhaler daily in past, given albuterol  inh by urgent care and used twice so far today /COPD No h/o DM, + h/o  Prediabetes,  last A1c good Lab Results  Component Value Date   HGBA1C 5.1 12/30/2019   HGBA1C 5.2 04/22/2013   Lab Results  Component Value Date   LDLCALC 155 (H) 12/30/2019   CREATININE 0.86 12/31/2019   No h/o heart disease, CKD,  + obesity Wt Readings from Last 3 Encounters:  04/17/20 200 lb (90.7 kg)  04/06/20 202 lb (91.6 kg)  04/03/20 202 lb 3.2 oz (91.7 kg)    Husband is home and helping her presently    Patient Active Problem List   Diagnosis Date Noted  . SAB (spontaneous abortion) 04/17/2020  . Nausea and vomiting 01/28/2020  . Nonintractable episodic headache 01/26/2020  . History of bipolar disorder 01/26/2020  . 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 Mixed hyperlipidemia 01/26/2020  . Generalized anxiety disorder 01/26/2020  . History of seizure 12/30/2019  . Insomnia 12/30/2019  . Class 1 obesity due to excess calories with serious comorbidity and body mass index (BMI) of 34.0 to 34.9 in adult 12/30/2019  . Prediabetes 12/30/2019  . Gastroesophageal reflux disease without esophagitis 12/30/2019  . Mild intermittent asthma without complication 12/30/2019  . Seasonal allergic rhinitis due to pollen 12/30/2019  . PTSD (post-traumatic stress disorder) 04/22/2013  . Cesarean delivery, without mention of indication, delivered, with or without mention of antepartum condition 05/11/2012  Past Surgical History:  Procedure Laterality Date  . CESAREAN SECTION  05/08/2012   Procedure: CESAREAN SECTION;  Surgeon: Brock Bad, MD;  Location: [facility];  Service: Obstetrics;  Laterality: N/A;  Primary Cesarean Section Delivery Girl @ 1555, Apgars 9/9  . NO PAST SURGERIES      Family History  Problem Relation Age of Onset  . Diabetes Mother   . Diabetes Maternal Aunt   . Diabetes Maternal Uncle   . Cancer Maternal Grandmother 50       unknown - made  eyes yellow  . Anesthesia problems Neg Hx     Social History   Tobacco Use  . Smoking status: Current Every Day Smoker    Packs/day: 1.00    Years: 14.00    Pack years: 14.00    Types: Cigarettes    Last attempt to quit: 11/08/2011    Years since quitting: 8.4  . Smokeless tobacco: Never Used  Substance Use Topics  . Alcohol use: No     Current Outpatient Medications:  .  acetaminophen (TYLENOL) 500 MG tablet, Take 500 mg by mouth every 6 (six) hours as needed., Disp: , Rfl:  .  lamoTRIgine (LAMICTAL) 25 MG tablet, Wk1:1tab night wk2:1tab 2xday wk3:1tab AM 2tabsPM wk4:2tabs 2xday wk5:2tabs AM 3tabs PM wk6:3tabs 2xday wk7:3tabs AM 4tabs PM, wk8:4tab2xday, Disp: , Rfl:  .  ondansetron (ZOFRAN) 4 MG tablet, Take 1 tablet (4 mg total) by mouth every 8 (eight) hours as needed for nausea or vomiting. (Patient not taking: Reported on 04/03/2020), Disp: 20 tablet, Rfl: 1 .  zolmitriptan (ZOMIG-ZMT) 2.5 MG disintegrating tablet, Take 1 tablet (2.5 mg total) by mouth as needed for migraine. May repeat dose X one after 2 hours prn (Patient not taking: Reported on 04/03/2020), Disp: 9 tablet, Rfl: 2  Allergies  Allergen Reactions  . Latex Itching and Rash  . Penicillins Swelling    Has patient had a PCN reaction causing immediate rash, facial/tongue/throat swelling, SOB or lightheadedness with hypotension: Unknown Has patient had a PCN reaction causing severe rash involving mucus membranes or skin necrosis: Unknown Has patient had a PCN reaction that required hospitalization: Unknown Has patient had a PCN reaction occurring within the last 10 years: Unknown If all of the above answers are "NO", then may proceed with Cephalosporin use.   . Ibuprofen Nausea Only  . Levetiracetam Other (See Comments)    Severe mood changes.   . Pineapple Itching  . Vicodin [Hydrocodone-Acetaminophen] Rash    With staff assistance, above reviewed with the patient today.  ROS: As per HPI, otherwise no  specific complaints on a limited and focused system review   Objective  Virtual encounter, vitals not obtained.  There is no height or weight on file to calculate BMI.  Physical Exam   Appears in NAD via conversation Breathing: No obvious respiratory distress. Speaking in complete sentences Neurological: Pt is alert, Speech is normal Psychiatric: Patient has a normal mood and affect,. Judgment and thought content normal.   No results found for this or any previous visit (from the past 72 hour(s)).  PHQ2/9: Depression screen [facility] 2/9 05/03/2020 04/03/2020 01/26/2020 12/30/2019  Decreased Interest 0 0 2 1  Down, Depressed, Hopeless 0 0 2 3  PHQ - 2 Score 0 0 4 4  Altered sleeping - 0 3 3  Tired, decreased energy - 0 2 3  Change in appetite - 0 2 2  Feeling bad or failure about yourself  - 0 2 3  Trouble concentrating - 0 1 3  Moving slowly or fidgety/restless - 0 1 0  Suicidal thoughts - 0 0 1  PHQ-9 Score - 0 15 19  Difficult doing work/chores - Not difficult at all Extremely dIfficult Very difficult   PHQ-2/9 Result reviewed  Fall Risk: Fall Risk  05/03/2020 04/03/2020 01/26/2020 12/30/2019  Falls in the past year? 0 0 0 0  Number falls in past yr: 0 0 0 0  Injury with Fall? 0 0 0 0  Follow up Falls evaluation completed Falls evaluation completed - -     Assessment & Plan 1. COVID-19 Educated on Covid and management recommendations, monoclonal antibody treatments entertained for non-hospitalized patients at risk for severe disease within 10 days of symptom onset, and given her history of asthma, obesity, and relative severity of symptoms described, feels she may be a candidate for this.  Discussed this option with her, and she agreed and a referral was placed to the infusion center via phone with help from [facility] for potential treatment  Recommend continued treatment and symptomatic measures - stay well hydrated, rest, a mucinex or robitussin type OTC product as needed, tylenol  alternating with ibuprofen products if she tolerates ibuprofen products prn for any low grade temps/fevers/muscle aches. She was prescribed an inhaler from the urgent care to use, and can continue to use that as needed as well. Her most concerning symptoms seem to be muscle ache and weakness, especially of the legs, and she wanted to know what she could do to help with that.  Noted the importance of hydration, and the Tylenol/ibuprofen products to help with some of the muscle aches, although if getting more weak to the point of unable to bear her weight getting up and about, or can not feel her legs at times, noted she needs to be seen more emergently in an emergency setting and she was understanding of this.  Remain isolated presently until recovered  If sx's worsening acutely, such as increased SOB, fevers, increased  weakness or other concerning sx's arise, should f/u and as noted above may need to be seen more emergently in the ER for more immediate evaluation.  2. Class 1 obesity due to excess calories with serious comorbidity and body mass index (BMI) of 34.0 to 34.9 in adult 3. Mild intermittent asthma without complication Noted the comorbid conditions that can increase the risk of severity of Covid  4. Weakness As noted above, this seemed to be her most pressing concern, and recommendations as noted above for this.     I discussed the assessment and treatment plan with the patient. The patient was provided an opportunity to ask questions and all were answered. The patient agreed with the plan and demonstrated an understanding of the instructions.  Red flags and when to present for emergency care or RTC including fever >101.66F, chest pain, shortness of breath, new/worsening/un-resolving symptoms reviewed with patient at time of visit.   The patient was advised to call back or seek an in-person evaluation if the symptoms worsen or if the condition fails to improve as anticipated.  I  provided 20 minutes of non-face-to-face time during this encounter that included discussing at length patient's sx/history, pertinent pmhx, medications, treatment and follow up plan. This time also included the necessary documentation, orders, and chart review.  Jamelle Haring, MD

## 2020-05-08 ENCOUNTER — Ambulatory Visit: Payer: Medicaid Other | Admitting: Obstetrics

## 2020-05-29 DIAGNOSIS — R002 Palpitations: Secondary | ICD-10-CM | POA: Insufficient documentation

## 2020-06-12 DIAGNOSIS — R0602 Shortness of breath: Secondary | ICD-10-CM | POA: Insufficient documentation

## 2020-06-12 DIAGNOSIS — R609 Edema, unspecified: Secondary | ICD-10-CM | POA: Insufficient documentation

## 2020-08-23 DIAGNOSIS — M62838 Other muscle spasm: Secondary | ICD-10-CM | POA: Insufficient documentation

## 2020-08-28 NOTE — Progress Notes (Signed)
Patient ID: Jenny Baldwin, female    DOB: [birth year], 35 y.o.   MRN: [id number]  PCP: Jamelle Haring, MD  Chief Complaint  Patient presents with  . Follow-up    Subjective:   Jenny Baldwin is a 35 y.o. female, presents to clinic with CC of the following:  Chief Complaint  Patient presents with  . Follow-up    HPI:  Patient is a 35 y.o.female Last visit with me was 05/03/20 after was Covid + F/U 's today for f/u after had a recent test - ECHO  Her last visit with cardiology was 07/24/20 and the following A/P noted:  35 year old female with a several year history of palpitations and heart racing, previously underwent cardiovascular evaluation and was apparently treated with a beta-blocker with clinical improvement, but discontinued taking her medications due to lack of insurance. 72-hour Holter monitor was unremarkable, revealing predominant sinus rhythm with a mean heart rate of 79 bpm with heart rate ranging from 54 to 127 bpm. The patient has chronic exertional dyspnea with history of tobacco abuse, and chronic peripheral edema. The patient restarted metoprolol succinate 25 mg at the last visit with no improvement of palpitations. She states the palpitations have been worse since Covid illness in 04/2020.  Plan   1. Increase metoprolol succinate to 50 mg daily for palpitations 2. Lasix 20 mg once daily PRN for persistent peripheral edema 3. Recommend low-sodium, no added salt diet and leg elevation for peripheral edema 4. 2D echocardiogram  5. Encourage smoking cessation Return to clinic after echo to discuss results   The results of her ECHO were as foillows:  INTERPRETATION NORMAL LEFT VENTRICULAR SYSTOLIC FUNCTION NORMAL RIGHT VENTRICULAR SYSTOLIC FUNCTION TRIVIAL REGURGITATION NOTED (See above) NO VALVULAR STENOSIS TRIVIAL MR, TR EF 55%  She continues to follow with Dr. Malvin Johns from neurology and last visit was 08/23/2020 with the following A/P  noted:  IMPRESSION/PLAN  [name] is a 35 y.o. female presenting for evaluation of  SEIZURE LIKE ACTIVITY / PNES / HEADACHES / BIPOLAR DISORDER / MUSCLE SPASMS - Ongoing. - Patient with spells, muscle spasms and headaches. Headaches improving, occur 2-3 times per week and less severe. Reports no new seizure-like activity but has daily spells where she is sluggish, has slowed speech and feels "spaced out".  - Symptoms are most likely consistent with stress spells due to normal results from routine and extended monitoring EEG - Increase Nortriptyline to 50 mg nightly, will call in refill of 50 mg pills. - Start Abilify 2 mg nightly for headaches and spells. - Continue Lamictal 150 mg twice a day, refill. - Could consider increasing abilify - Recommend that patient write in a journal about feelings for 5-10 minutes a day to help process emotions and relieve stress and anxiety. Daylio and Journey are free journaling apps.  Follow up with Dr. Malvin Johns 4-6 weeks.    She notes that she had not heard about the echo results and shared them with her today. She also notes that she has been having episodes of slowness, chest periods where she moves slower and feels like things are slowing down, and when she mentioned this to the neurologist, it was noted possibly related to anxiety.  She also at times has shaky feelings and feeling dizzy.  She checks her sugars when this occurs, and lately they have been running in the 140-170 range when she checks.  She has been checking them almost a couple times a day  now.  She has had no low blood sugars.  She has a history of prediabetes, although has a strong family history of diabetes with her mom and grandmother having diabetes She thinks she is urinating more, although has been taking a Lasix product as above noted which contributes.  Denies marked increased thirst.  Think she has gained a little weight recently.  She has no history of thyroid disease, with a TSH  checked last year and it was normal. She has taken the increase metoprolol dose for palpitations, and still episodically has those as well.  Also still has some intermittent lower extremity swelling.  Cardiology input as noted above with the echocardiogram done and unremarkable and reviewed with her today.  Was to follow-up with cardiology after the echo.   bipolar disorder, major depression, PTSD hx-had been seeing a physician in [place] area up until 3 to 4 years ago, with that stopped due to lack of insurance. I did refer to psychiatry on prior  visit, although no arrangements to be seen by them have been made as of yet. She noted in past that she is not comfortable talking with people, and does not want to go to a place to be seen. I did note that can be helpful with online visits, and she very much was okay with pursuing that if it could be online. I did note a couple places in the area that would potentially be helpful in recent pastl, with information provided to her including a number to call (for [facility]), as I did think it is important to get their help, although she has not pursued. Has been seeing neurology who has been making the medication recommendations as noted above      Patient Active Problem List   Diagnosis Date Noted  . COVID-19 05/03/2020  . SAB (spontaneous abortion) 04/17/2020  . Nausea and vomiting 01/28/2020  . Nonintractable episodic headache 01/26/2020  . History of bipolar disorder 01/26/2020  . 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 Mixed hyperlipidemia 01/26/2020  . Generalized anxiety disorder 01/26/2020  . History of seizure 12/30/2019  . Insomnia 12/30/2019  . Class 1 obesity due to excess calories with serious comorbidity and body mass index (BMI) of 34.0 to 34.9 in adult 12/30/2019  . Prediabetes 12/30/2019  . Gastroesophageal reflux disease without esophagitis 12/30/2019  . Mild intermittent asthma without  complication 12/30/2019  . Seasonal allergic rhinitis due to pollen 12/30/2019  . PTSD (post-traumatic stress disorder) 04/22/2013  . Cesarean delivery, without mention of indication, delivered, with or without mention of antepartum condition 05/11/2012      Current Outpatient Medications:  .  albuterol (VENTOLIN HFA) 108 (90 Base) MCG/ACT inhaler, TAKE 2 PUFFS BY MOUTH EVERY 4 TO 6 HOURS AS NEEDED, Disp: , Rfl:  .  ARIPiprazole (ABILIFY) 2 MG tablet, Take by mouth., Disp: , Rfl:  .  furosemide (LASIX) 20 MG tablet, Take by mouth., Disp: , Rfl:  .  lamoTRIgine (LAMICTAL) 150 MG tablet, Take 150 mg by mouth 2 (two) times daily., Disp: , Rfl:  .  metoprolol succinate (TOPROL-XL) 25 MG 24 hr tablet, Take by mouth., Disp: , Rfl:  .  nortriptyline (PAMELOR) 50 MG capsule, Take 50 mg at night, Disp: , Rfl:  .  zolmitriptan (ZOMIG-ZMT) 2.5 MG disintegrating tablet, Take 1 tablet (2.5 mg total) by mouth as needed for migraine. May repeat dose X one after 2 hours prn, Disp: 9 tablet, Rfl: 2   Allergies  Allergen Reactions  . Latex Itching and Rash  . Penicillins Swelling    Has patient had a PCN reaction causing immediate rash, facial/tongue/throat swelling, SOB or lightheadedness with hypotension: Unknown Has patient had a PCN reaction causing severe rash involving mucus membranes or skin necrosis: Unknown Has patient had a PCN reaction that required hospitalization: Unknown Has patient had a PCN reaction occurring within the last 10 years: Unknown If all of the above answers are "NO", then may proceed with Cephalosporin use.   . Ibuprofen Nausea Only  . Levetiracetam Other (See Comments)    Severe mood changes.   . Pineapple Itching  . Vicodin [Hydrocodone-Acetaminophen] Rash     Past Surgical History:  Procedure Laterality Date  . CESAREAN SECTION  05/08/2012   Procedure: CESAREAN SECTION;  Surgeon: Brock Bad, MD;  Location: [facility];  Service: Obstetrics;  Laterality: N/A;   Primary Cesarean Section Delivery Girl @ 1555, Apgars 9/9  . NO PAST SURGERIES       Family History  Problem Relation Age of Onset  . Diabetes Mother   . Diabetes Maternal Aunt   . Diabetes Maternal Uncle   . Cancer Maternal Grandmother 50       unknown - made eyes yellow  . Anesthesia problems Neg Hx      Social History   Tobacco Use  . Smoking status: Current Every Day Smoker    Packs/day: 1.00    Years: 14.00    Pack years: 14.00    Types: Cigarettes    Last attempt to quit: 11/08/2011    Years since quitting: 8.8  . Smokeless tobacco: Never Used  Substance Use Topics  . Alcohol use: No    With staff assistance, above reviewed with the patient today.  ROS: As per HPI, otherwise no specific complaints on a limited and focused system review   No results found for this or any previous visit (from the past 72 hour(s)).   PHQ2/9: Depression screen [facility] 2/9 08/29/2020 05/03/2020 04/03/2020 01/26/2020 12/30/2019  Decreased Interest 1 0 0 2 1  Down, Depressed, Hopeless 1 0 0 2 3  PHQ - 2 Score 2 0 0 4 4  Altered sleeping 3 - 0 3 3  Tired, decreased energy 2 - 0 2 3  Change in appetite 3 - 0 2 2  Feeling bad or failure about yourself  3 - 0 2 3  Trouble concentrating 3 - 0 1 3  Moving slowly or fidgety/restless 3 - 0 1 0  Suicidal thoughts 0 - 0 0 1  PHQ-9 Score 19 - 0 15 19  Difficult doing work/chores Somewhat difficult - Not difficult at all Extremely dIfficult Very difficult   PHQ-2/9 Result reviewed   Fall Risk: Fall Risk  08/29/2020 05/03/2020 04/03/2020 01/26/2020 12/30/2019  Falls in the past year? 0 0 0 0 0  Number falls in past yr: 0 0 0 0 0  Injury with Fall? 0 0 0 0 0  Follow up - Falls evaluation completed Falls evaluation completed - -      Objective:   Vitals:   08/29/20 1101  BP: 130/80  Pulse: 97  Resp: 16  Temp: 97.8 F (36.6 C)  TempSrc: Oral  Weight: 212 lb 11.2 oz (96.5 kg)  Height: 5\' 2"  (1.575 m)    Body mass index is 38.9  kg/m.  Physical Exam   NAD, masked, not ill-appearing HEENT - [place]/AT, sclera anicteric, EOMI without nystagmus, PERRL, conj - non-inj'ed, pharynx clear  Neck - supple, no adenopathy, no rigidity,  no obvious TM, carotids 2+ and equal without bruits Car - RRR without m/g/r Pulm- RR and effort normal at rest, CTA without wheeze or rales Abd - soft, NT diffusely, obese Back -no CVA tenderness Ext - no marked LE edema, Neuro/psychiatric - affect was not flat, appropriate with conversation, speech was not rapid             Alert and oriented             Grossly non-focal,  Results for orders placed or performed in visit on 04/17/20  Beta hCG quant (ref lab)  Result Value Ref Range   hCG Quant <1 mIU/mL  POCT urine pregnancy  Result Value Ref Range   Preg Test, Ur Negative Negative       Assessment & Plan:    1. Palpitations 2. Localized swelling of both lower extremities Had seen cardiology, with an echo done very recently and the results unremarkable and reviewed with her today. She was to follow-up with cardiology after the echo was obtained, and await that follow-up and further input. He remains on the increased beta-blocker dose as well as the Lasix product as needed about 2-3 times a week for her lower extremity swelling.  3. History of seizure 4. History of bipolar disorder/PTSD 5. Generalized anxiety disorder Has been seen by neurology, with their input as noted above at the last visit. Medication changes as noted including the addition of Abilify, nortriptyline was increased, and she remains on the Lamictal product. Continue to follow with neurology as planned. In the past, had tried with referrals to psychiatry, although has not been pursued by the patient.  6. Dizziness/episodes of slowness noted. May have some relationship to anxiety/stress, as patient was informed of that via the neurology visit. Exact source unclear as reviewed today, and does have some lightheaded  more than dizziness episodes, not like true vertigo.  Question if does have some bradykinesia by history, not evident on assessment today. Felt best to check some lab test presently as her sugars have been running a little higher in the recent past on checks at home. We will check an A1c and a BMP as well as a TSH.  Will have those results back likely by tomorrow, and await those lab results presently.  Do feel having follow-up again in about 4 weeks time to reassess is needed, and also to continue her follow-ups with neurology as planned She was made aware today that follow-up will be with a new provider as I will be leaving this practice sooner than that planned follow-up. Can follow-up sooner as needed       Jamelle Haring, MD 08/29/20 11:28 AM

## 2020-08-29 ENCOUNTER — Other Ambulatory Visit: Payer: Self-pay

## 2020-08-29 ENCOUNTER — Ambulatory Visit (INDEPENDENT_AMBULATORY_CARE_PROVIDER_SITE_OTHER): Payer: Medicaid Other | Admitting: Internal Medicine

## 2020-08-29 VITALS — BP 130/80 | HR 97 | Temp 97.8°F | Resp 16 | Ht 62.0 in | Wt 212.7 lb

## 2020-08-29 DIAGNOSIS — M7989 Other specified soft tissue disorders: Secondary | ICD-10-CM

## 2020-08-29 DIAGNOSIS — F411 Generalized anxiety disorder: Secondary | ICD-10-CM

## 2020-08-29 DIAGNOSIS — R42 Dizziness and giddiness: Secondary | ICD-10-CM

## 2020-08-29 DIAGNOSIS — Z8659 Personal history of other mental and behavioral disorders: Secondary | ICD-10-CM

## 2020-08-29 DIAGNOSIS — F4310 Post-traumatic stress disorder, unspecified: Secondary | ICD-10-CM

## 2020-08-29 DIAGNOSIS — Z87898 Personal history of other specified conditions: Secondary | ICD-10-CM

## 2020-08-29 DIAGNOSIS — R002 Palpitations: Secondary | ICD-10-CM | POA: Diagnosis not present

## 2020-08-30 LAB — BASIC METABOLIC PANEL WITH GFR
BUN: 8 mg/dL (ref 7–25)
CO2: 25 mmol/L (ref 20–32)
Calcium: 9.8 mg/dL (ref 8.6–10.2)
Chloride: 108 mmol/L (ref 98–110)
Creat: 0.94 mg/dL (ref 0.50–1.10)
GFR, Est African American: 90 mL/min/{1.73_m2} (ref >=60)
GFR, Est Non African American: 78 mL/min/{1.73_m2} (ref >=60)
Glucose, Bld: 82 mg/dL (ref 65–99)
Potassium: 4.8 mmol/L (ref 3.5–5.3)
Sodium: 142 mmol/L (ref 135–146)

## 2020-08-30 LAB — TSH: TSH: 2.47 mIU/L

## 2020-08-30 LAB — HEMOGLOBIN A1C
Hgb A1c MFr Bld: 5.4 % of total Hgb (ref <5.7)
Mean Plasma Glucose: 108 mg/dL
eAG (mmol/L): 6 mmol/L

## 2020-09-01 ENCOUNTER — Telehealth: Payer: Self-pay | Admitting: Internal Medicine

## 2020-09-01 NOTE — Telephone Encounter (Unsigned)
Copied from CRM [id number]. Topic: General - Other >> Sep 01, 2020 12:14 PM Gwenlyn Fudge wrote: Reason for CRM: Pts spouse called stating that when pt woke up this morning her blood sugar was 131. He states that she checked 2 hours later and it was 141, and 15 minutes later it was 144. He states that he would like to know if this is normal. Please advise.

## 2020-09-01 NOTE — Telephone Encounter (Signed)
Blood sugar checks in the 140s is not a major concern, and her last A1c was very good.  I would recommend she check fasting blood sugars in the morning 2 or 3 times a week at most for the next few weeks and write them down and bring that log with her on her next follow-up visit. She can also continue to check sugars as needed if she is having any symptoms of concern.  Thanks

## 2020-09-04 NOTE — Telephone Encounter (Signed)
Sent message about blood sugars via mychart.

## 2020-09-12 ENCOUNTER — Ambulatory Visit: Payer: Medicaid Other | Admitting: Family Medicine

## 2020-10-04 DIAGNOSIS — F445 Conversion disorder with seizures or convulsions: Secondary | ICD-10-CM | POA: Insufficient documentation

## 2020-10-04 HISTORY — DX: Conversion disorder with seizures or convulsions: F44.5

## 2020-10-23 ENCOUNTER — Ambulatory Visit (INDEPENDENT_AMBULATORY_CARE_PROVIDER_SITE_OTHER): Payer: Medicaid Other | Admitting: Podiatry

## 2020-10-23 ENCOUNTER — Other Ambulatory Visit: Payer: Self-pay

## 2020-10-23 ENCOUNTER — Encounter: Payer: Self-pay | Admitting: Podiatry

## 2020-10-23 DIAGNOSIS — L600 Ingrowing nail: Secondary | ICD-10-CM

## 2020-10-23 NOTE — Patient Instructions (Signed)

## 2020-10-23 NOTE — Progress Notes (Signed)
  Subjective:  Patient ID: Jenny Baldwin, female    DOB: [birth year],  MRN: [id number]  Chief Complaint  Patient presents with  . Nail Problem    Patient presents today for ingrown toenails bilat great toes both borders x 2-3 weeks.  Lt >Rt and very tender to touch    35 y.o. female presents with the above complaint. History confirmed with patient.   Objective:  Physical Exam: warm, good capillary refill, no trophic changes or ulcerative lesions, normal DP and PT pulses and normal sensory exam.  Both great toenails have medial and lateral ingrowing nail borders without paronychia Assessment:   1. Ingrowing right great toenail   2. Ingrowing left great toenail      Plan:  Patient was evaluated and treated and all questions answered.    Ingrown Nail, bilaterally -Patient elects to proceed with minor surgery to remove ingrown toenail today. Consent reviewed and signed by patient. -Ingrown nail excised. See procedure note. -Educated on post-procedure care including soaking. Written instructions provided and reviewed. -Patient to follow up in 2 weeks for nail check.  Procedure: Excision of Ingrown Toenail Location: Bilateral 1st toe medial and lateral nail borders. Anesthesia: Lidocaine 1% plain; 1.5 mL and Marcaine 0.5% plain; 1.5 mL, digital block. Skin Prep: Betadine. Dressing: Silvadene; telfa; dry, sterile, compression dressing. Technique: Following skin prep, the toe was exsanguinated and a tourniquet was secured at the base of the toe. The affected nail border was freed, split with a nail splitter, and excised. Chemical matrixectomy was then performed with phenol and irrigated out with alcohol. The tourniquet was then removed and sterile dressing applied. Disposition: Patient tolerated procedure well. Patient to return in 2 weeks for follow-up.     Return in about 2 weeks (around 11/06/2020).

## 2020-11-06 ENCOUNTER — Other Ambulatory Visit: Payer: Self-pay

## 2020-11-06 ENCOUNTER — Encounter: Payer: Self-pay | Admitting: Podiatry

## 2020-11-06 ENCOUNTER — Ambulatory Visit (INDEPENDENT_AMBULATORY_CARE_PROVIDER_SITE_OTHER): Payer: Medicaid Other | Admitting: Podiatry

## 2020-11-06 DIAGNOSIS — L600 Ingrowing nail: Secondary | ICD-10-CM | POA: Diagnosis not present

## 2020-11-06 NOTE — Progress Notes (Signed)
  Subjective:  Patient ID: Jenny Baldwin, female    DOB: [birth year],  MRN: [id number]  Chief Complaint  Patient presents with  . Ingrown Toenail    "they both throb and are still red and looks infected"    35 y.o. female returns for follow-up with the above complaint. History confirmed with patient.   Objective:  Physical Exam: warm, good capillary refill, no trophic changes or ulcerative lesions, normal DP and PT pulses and normal sensory exam.  Matricectomy sites are healing well, she does have some persistent redness, no purulence or malodor or signs of cellulitis Assessment:   1. Ingrowing right great toenail   2. Ingrowing left great toenail      Plan:  Patient was evaluated and treated and all questions answered.   Discussed with her she likely has residual redness and erythema and tenderness from the phenol application.  Advised to continue soaks for the next week but no longer any ointment or Band-Aid.  Leave open to air and allow to heal up and scab over.  Return as needed if that has further issues in 5 to 6 weeks    Return if symptoms worsen or fail to improve.

## 2021-09-01 ENCOUNTER — Emergency Department: Payer: Medicaid Other

## 2021-09-01 ENCOUNTER — Other Ambulatory Visit: Payer: Self-pay

## 2021-09-01 ENCOUNTER — Emergency Department
Admission: EM | Admit: 2021-09-01 | Discharge: 2021-09-01 | Disposition: A | Discharge status: Home / Self Care | Payer: Medicaid Other | Attending: Emergency Medicine | Admitting: Emergency Medicine

## 2021-09-01 DIAGNOSIS — S83001A Unspecified subluxation of right patella, initial encounter: Secondary | ICD-10-CM | POA: Diagnosis not present

## 2021-09-01 DIAGNOSIS — W010XXA Fall on same level from slipping, tripping and stumbling without subsequent striking against object, initial encounter: Secondary | ICD-10-CM | POA: Insufficient documentation

## 2021-09-01 DIAGNOSIS — S8991XA Unspecified injury of right lower leg, initial encounter: Secondary | ICD-10-CM | POA: Diagnosis present

## 2021-09-01 DIAGNOSIS — Z8616 Personal history of COVID-19: Secondary | ICD-10-CM | POA: Insufficient documentation

## 2021-09-01 DIAGNOSIS — J45909 Unspecified asthma, uncomplicated: Secondary | ICD-10-CM | POA: Diagnosis not present

## 2021-09-01 NOTE — ED Provider Notes (Signed)
[facility] Provider Note    Event Date/Time   First MD Initiated Contact with Patient 09/01/21 1816     (approximate)   History   Knee Injury   HPI  Jenny Baldwin is a 36 y.o. female past medical history of bipolar disorder, GERD, pseudoseizures, PTSD and OCD who presents with right knee pain.  Patient says that she was standing today when she felt like her kneecap moved to the side and she then fell onto the ground.  Has had knee pain and swelling since.  Denies any injury or provoking factor.  Has been able to bear weight.  Patient is not able to take NSAIDs due to stomach upset.    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   Muscle spasm 08/23/2020   Exertional shortness of breath 06/12/2020   Peripheral edema 06/12/2020   Heart palpitations 05/29/2020   COVID-19 05/03/2020   Spell of altered consciousness 04/18/2020   SAB (spontaneous abortion) 04/17/2020   Nausea and vomiting 01/28/2020   Nonintractable episodic headache 01/26/2020   History of bipolar disorder 01/26/2020   Current moderate episode of major depressive disorder (HCC) 01/26/2020   Mixed hyperlipidemia 01/26/2020   Generalized anxiety disorder 01/26/2020   Seizure-like activity (HCC) 01/07/2020   History of seizure 12/30/2019   Insomnia 12/30/2019   Class 1 obesity due to excess calories with serious comorbidity and body mass index (BMI) of 34.0 to 34.9 in adult 12/30/2019   Prediabetes 12/30/2019   Gastroesophageal reflux disease without esophagitis 12/30/2019   Mild intermittent asthma  without complication 12/30/2019   Seasonal allergic rhinitis due to pollen 12/30/2019   Bipolar disorder, unspecified (HCC) 01/05/2014   Vitamin D deficiency 01/05/2014   Tobacco abuse 12/17/2013   PTSD (post-traumatic stress disorder) 04/22/2013   Cesarean delivery, without mention of indication, delivered, with or without mention of antepartum condition 05/11/2012     Physical Exam  Triage Vital Signs: ED Triage Vitals [09/01/21 1441]  Enc Vitals Group     BP 128/85     Pulse Rate 84     Resp 14     Temp 98.4 F (36.9 C)     Temp Source Oral     SpO2 96 %     Weight 250 lb (113.4 kg)     Height 5\' 2"  (1.575 m)     Head Circumference      Peak Flow      Pain Score 8     Pain Loc      Pain Edu?      Excl. in GC?     Most recent vital signs: Vitals:   09/01/21 1441  BP: 128/85  Pulse: 84  Resp: 14  Temp: 98.4 F (36.9 C)  SpO2: 96%     General: Awake, no distress.  CV:  Good peripheral perfusion.  Resp:  Normal effort.  Abd:  No distention.  Neuro:  Awake, Alert, Oriented x 3  Other:  Right knee with small effusion, tenderness to palpation over the patella, patella is midline, straight leg raise intact, no valgus or varus laxity, 2+ DP pulse, able to range but with associated pain   ED Results / Procedures / Treatments  Labs (all labs ordered are listed, but only abnormal results are displayed) Labs Reviewed - No data to display   EKG     RADIOLOGY Reviewed the x-ray of the right knee which is negative for fracture, agree with radiology report   PROCEDURES:  Critical Care performed: No     MEDICATIONS ORDERED IN ED: Medications - No data to display   IMPRESSION / MDM / ASSESSMENT AND PLAN / ED COURSE  I reviewed the triage vital signs and the nursing notes.                              Differential diagnosis includes, but is not limited to, sprain, patellar dislocation, patellar subluxation  Patient is a 36 year old female  presents with right knee pain and swelling.  She notes that she saw her kneecap moved to the side and she subsequently had a fall but seems to have relocated on its own.  She was not doing any specific activity nor did she have any injury that led to the presumed subluxation.  On exam she does have a small effusion and tenderness over the patella but there is no significant laxity to suggest knee dislocation and she can range it and has an intact straight leg raise and is neurovascularly intact.  Story is suspicious for patellar subluxation.  X-rays negative for fracture does show a small effusion.  Patient was placed in knee immobilizer for comfort and given crutches.  Orthopedic follow-up.      FINAL CLINICAL IMPRESSION(S) / ED DIAGNOSES   Final diagnoses:  Subluxation of right patella, initial encounter     Rx / DC Orders   ED Discharge Orders     None        Note:  This document was prepared using Dragon voice recognition software and may include unintentional dictation errors.   Georga Hacking, MD 09/01/21 1836

## 2021-09-01 NOTE — ED Triage Notes (Signed)
PAtient reports slipping and injuring right knee. Can put weight with pain

## 2021-09-01 NOTE — Discharge Instructions (Signed)
Your patellar likely subluxed which is why you are having pain and some swelling.  Take Tylenol for the pain.  You can wear the knee immobilizer for comfort.  Please follow-up with the orthopedic doctors as you may need to have an MRI if you have ongoing discomfort.

## 2021-09-01 NOTE — ED Provider Triage Note (Signed)
Emergency Medicine Provider Triage Evaluation Note  Jenny Baldwin, a 36 y.o. female  was evaluated in triage.  Pt complains of right knee pain following mechanical fall.  She report its pain to the right knee primarily, as well as right-sided pain after she fell and slipped yesterday.  Denies any head injury or LOC.  She gives remote history of some knee disability, noting intermittent episodes of give way.  Review of Systems  Positive: Right knee pain Negative: No head injury, LOC  Physical Exam  BP 128/85 (BP Location: Left Arm)    Pulse 84    Temp 98.4 F (36.9 C) (Oral)    Resp 14    Ht 5\' 2"  (1.575 m)    Wt 113.4 kg    SpO2 96%    BMI 45.73 kg/m  Gen:   Awake, no distress   Resp:  Normal effort CTA MSK:   Moves extremities without difficulty  Other:  CVS: RRRR  Medical Decision Making  Medically screening exam initiated at 2:59 PM.  Appropriate orders placed.  Jenny Baldwin was informed that the remainder of the evaluation will be completed by another provider, this initial triage assessment does not replace that evaluation, and the importance of remaining in the ED until their evaluation is complete.  Patient ED evaluation of injury sustained following mechanical fall.  She presents injury yesterday, presented today with right knee pain.   Jenny Levine, PA-C 09/01/21 1507

## 2022-06-24 ENCOUNTER — Encounter (HOSPITAL_BASED_OUTPATIENT_CLINIC_OR_DEPARTMENT_OTHER): Payer: Self-pay

## 2022-06-24 DIAGNOSIS — R0683 Snoring: Secondary | ICD-10-CM

## 2022-06-24 DIAGNOSIS — R0681 Apnea, not elsewhere classified: Secondary | ICD-10-CM

## 2022-06-24 DIAGNOSIS — G4710 Hypersomnia, unspecified: Secondary | ICD-10-CM

## 2022-09-24 ENCOUNTER — Other Ambulatory Visit: Payer: Self-pay | Admitting: Family Medicine

## 2022-09-24 DIAGNOSIS — R748 Abnormal levels of other serum enzymes: Secondary | ICD-10-CM

## 2022-10-21 ENCOUNTER — Ambulatory Visit
Admission: RE | Admit: 2022-10-21 | Discharge: 2022-10-21 | Disposition: A | Discharge status: Home / Self Care | Payer: Medicaid Other | Source: Ambulatory Visit | Attending: Family Medicine | Admitting: Family Medicine

## 2022-10-21 DIAGNOSIS — R748 Abnormal levels of other serum enzymes: Secondary | ICD-10-CM

## 2022-11-07 ENCOUNTER — Other Ambulatory Visit: Payer: Self-pay | Admitting: Orthopedic Surgery

## 2022-11-07 DIAGNOSIS — S83241A Other tear of medial meniscus, current injury, right knee, initial encounter: Secondary | ICD-10-CM

## 2022-11-13 ENCOUNTER — Encounter: Payer: Self-pay | Admitting: Orthopedic Surgery

## 2022-11-21 ENCOUNTER — Ambulatory Visit
Admission: RE | Admit: 2022-11-21 | Discharge: 2022-11-21 | Disposition: A | Discharge status: Home / Self Care | Payer: Medicaid Other | Source: Ambulatory Visit | Attending: Orthopedic Surgery | Admitting: Orthopedic Surgery

## 2022-11-21 DIAGNOSIS — S83241A Other tear of medial meniscus, current injury, right knee, initial encounter: Secondary | ICD-10-CM

## 2023-01-21 ENCOUNTER — Encounter: Payer: Self-pay | Admitting: Internal Medicine

## 2023-01-22 ENCOUNTER — Encounter: Payer: Self-pay | Admitting: Internal Medicine

## 2023-01-22 ENCOUNTER — Ambulatory Visit: Payer: Medicaid Other | Admitting: Anesthesiology

## 2023-01-22 ENCOUNTER — Ambulatory Visit
Admission: RE | Admit: 2023-01-22 | Discharge: 2023-01-22 | Disposition: A | Discharge status: Home / Self Care | Payer: Medicaid Other | Source: Ambulatory Visit | Attending: Internal Medicine | Admitting: Internal Medicine

## 2023-01-22 ENCOUNTER — Encounter
Admission: RE | Disposition: A | Discharge status: Home / Self Care | Payer: Self-pay | Source: Ambulatory Visit | Attending: Internal Medicine

## 2023-01-22 DIAGNOSIS — R194 Change in bowel habit: Secondary | ICD-10-CM | POA: Diagnosis present

## 2023-01-22 DIAGNOSIS — Z79899 Other long term (current) drug therapy: Secondary | ICD-10-CM | POA: Insufficient documentation

## 2023-01-22 DIAGNOSIS — G4733 Obstructive sleep apnea (adult) (pediatric): Secondary | ICD-10-CM | POA: Diagnosis not present

## 2023-01-22 DIAGNOSIS — K449 Diaphragmatic hernia without obstruction or gangrene: Secondary | ICD-10-CM | POA: Insufficient documentation

## 2023-01-22 DIAGNOSIS — Z6841 Body Mass Index (BMI) 40.0 and over, adult: Secondary | ICD-10-CM | POA: Insufficient documentation

## 2023-01-22 DIAGNOSIS — K921 Melena: Secondary | ICD-10-CM | POA: Diagnosis not present

## 2023-01-22 DIAGNOSIS — D120 Benign neoplasm of cecum: Secondary | ICD-10-CM | POA: Diagnosis not present

## 2023-01-22 DIAGNOSIS — K219 Gastro-esophageal reflux disease without esophagitis: Secondary | ICD-10-CM | POA: Insufficient documentation

## 2023-01-22 DIAGNOSIS — K642 Third degree hemorrhoids: Secondary | ICD-10-CM | POA: Insufficient documentation

## 2023-01-22 DIAGNOSIS — K2970 Gastritis, unspecified, without bleeding: Secondary | ICD-10-CM | POA: Insufficient documentation

## 2023-01-22 HISTORY — PX: COLONOSCOPY: SHX5424

## 2023-01-22 HISTORY — PX: ESOPHAGOGASTRODUODENOSCOPY (EGD) WITH PROPOFOL: SHX5813

## 2023-01-22 LAB — POCT PREGNANCY, URINE: Preg Test, Ur: NEGATIVE

## 2023-01-22 SURGERY — COLONOSCOPY
Anesthesia: General

## 2023-01-22 MED ORDER — SODIUM CHLORIDE 0.9 % IV SOLN: INTRAVENOUS | Status: DC

## 2023-01-22 MED ORDER — PROPOFOL 500 MG/50ML IV EMUL
INTRAVENOUS | Status: DC | PRN
  Administered 2023-01-22: 120 ug/kg/min via INTRAVENOUS

## 2023-01-22 MED ORDER — PROPOFOL 10 MG/ML IV BOLUS
INTRAVENOUS | Status: DC | PRN
  Administered 2023-01-22: 30 mg via INTRAVENOUS
  Administered 2023-01-22: 70 mg via INTRAVENOUS

## 2023-01-22 MED ORDER — MIDAZOLAM HCL 2 MG/2ML IJ SOLN
INTRAMUSCULAR | Status: AC
  Filled 2023-01-22: qty 2

## 2023-01-22 MED ORDER — MIDAZOLAM HCL 5 MG/5ML IJ SOLN
INTRAMUSCULAR | Status: DC | PRN
  Administered 2023-01-22: 2 mg via INTRAVENOUS

## 2023-01-22 NOTE — Interval H&P Note (Signed)
History and Physical Interval Note:  01/22/2023 2:40 PM  Jenny Baldwin  has presented today for surgery, with the diagnosis of 530.81 (ICD-9-CM) - K21.9 (ICD-10-CM) - Gastroesophageal reflux disease, unspecified whether esophagitis present 578.1 (ICD-9-CM) - K92.1 (ICD-10-CM) - Hematochezia.  The various methods of treatment have been discussed with the patient and family. After consideration of risks, benefits and other options for treatment, the patient has consented to  Procedure(s): COLONOSCOPY (N/A) ESOPHAGOGASTRODUODENOSCOPY (EGD) WITH PROPOFOL (N/A) as a surgical intervention.  The patient's history has been reviewed, patient examined, no change in status, stable for surgery.  I have reviewed the patient's chart and labs.  Questions were answered to the patient's satisfaction.     [place], [place]

## 2023-01-22 NOTE — Transfer of Care (Signed)
Immediate Anesthesia Transfer of Care Note  Patient: Jenny Baldwin  Procedure(s) Performed: COLONOSCOPY ESOPHAGOGASTRODUODENOSCOPY (EGD) WITH PROPOFOL  Patient Location: PACU and Endoscopy Unit  Anesthesia Type:General  Level of Consciousness: drowsy and patient cooperative  Airway & Oxygen Therapy: Patient Spontanous Breathing  Post-op Assessment: Report given to RN and Post -op Vital signs reviewed and stable  Post vital signs: Reviewed and stable  Last Vitals:  Vitals Value Taken Time  BP 107/66 1524 01/22/2023  Temp    Pulse 80   Resp    SpO2 98     Last Pain:  Vitals:   01/22/23 1416  TempSrc: Temporal  PainSc: 0-No pain         Complications: No notable events documented.

## 2023-01-22 NOTE — Op Note (Signed)
[facility] Gastroenterology Patient Name: Jenny Baldwin Procedure Date: 01/22/2023 2:35 PM MRN: [id number] Account #: [id number] Date of Birth: November 14, 1985 Admit Type: Outpatient Age: 37 Room: [facility] ENDO ROOM 4 Gender: Female Note Status: Finalized Instrument Name: Prentice Docker [id number] Procedure:             Colonoscopy Indications:           Hematochezia, Change in bowel habits Providers:             Boykin Nearing. Taniqua Issa MD, MD Medicines:             Propofol per Anesthesia Complications:         No immediate complications. Estimated blood loss:                         Minimal. Procedure:             Pre-Anesthesia Assessment:                        - The risks and benefits of the procedure and the                         sedation options and risks were discussed with the                         patient. All questions were answered and informed                         consent was obtained.                        - Patient identification and proposed procedure were                         verified prior to the procedure by the nurse. The                         procedure was verified in the procedure room.                        - ASA Grade Assessment: III - A patient with severe                         systemic disease.                        - After reviewing the risks and benefits, the patient                         was deemed in satisfactory condition to undergo the                         procedure.                        After obtaining informed consent, the colonoscope was                         passed under direct vision. Throughout the procedure,  the patient's blood pressure, pulse, and oxygen                         saturations were monitored continuously. The                         Colonoscope was introduced through the anus and                         advanced to the the terminal ileum, with                         identification  of the appendiceal orifice and IC                         valve. The colonoscopy was performed without                         difficulty. The patient tolerated the procedure well.                         The quality of the bowel preparation was good. The                         ileocecal valve, appendiceal orifice, and rectum were                         photographed. Findings:      The perianal exam findings include internal hemorrhoids that prolapse       with straining, but require manual replacement into the anal canal       (Grade III).      Non-bleeding internal hemorrhoids were found during retroflexion. The       hemorrhoids were Grade III (internal hemorrhoids that prolapse but       require manual reduction).      The terminal ileum appeared normal. Biopsies were taken with a cold       forceps for histology.      A 3 mm polyp was found in the ileocecal valve. The polyp was sessile.       The polyp was removed with a cold biopsy forceps. Resection and       retrieval were complete.      No other significant abnormalities were identified in a careful       examination of the remainder of the colon.      Biopsies for histology were taken with a cold forceps from the right       colon, left colon and rectum for evaluation of microscopic colitis. Impression:            - Internal hemorrhoids that prolapse with straining,                         but require manual replacement into the anal canal                         (Grade III) found on perianal exam.                        - Non-bleeding internal hemorrhoids.                        -  The examined portion of the ileum was normal.                         Biopsied.                        - One 3 mm polyp at the ileocecal valve, removed with                         a cold biopsy forceps. Resected and retrieved.                        - Biopsies were taken with a cold forceps from the                         right colon, left  colon and rectum for evaluation of                         microscopic colitis. Recommendation:        - Await pathology results from EGD, also performed                         today.                        - Patient has a contact number available for                         emergencies. The signs and symptoms of potential                         delayed complications were discussed with the patient.                         Return to normal activities tomorrow. Written                         discharge instructions were provided to the patient.                        - High fiber diet.                        - Continue present medications.                        - Await pathology results.                        - Return to my office in 4 months.                        - Telephone GI office to schedule appointment.                        - The findings and recommendations were discussed with                         the patient. Procedure Code(s):     --- Professional ---  16109, Colonoscopy, flexible; with biopsy, single or                         multiple Diagnosis Code(s):     --- Professional ---                        R19.4, Change in bowel habit                        K92.1, Melena (includes Hematochezia)                        D12.0, Benign neoplasm of cecum                        K64.2, Third degree hemorrhoids CPT copyright 2022 American [employer] Association. All rights reserved. The codes documented in this report are preliminary and upon coder review may  be revised to meet current compliance requirements. Stanton Kidney MD, MD 01/22/2023 3:26:13 PM This report has been signed electronically. Number of Addenda: 0 Note Initiated On: 01/22/2023 2:35 PM Scope Withdrawal Time: 0 hours 6 minutes 57 seconds  Total Procedure Duration: 0 hours 11 minutes 36 seconds  Estimated Blood Loss:  Estimated blood loss was minimal. Estimated blood                          loss: none.      [facility]

## 2023-01-22 NOTE — H&P (Signed)
Outpatient short stay form Pre-procedure 01/22/2023 2:38 PM Jenny Baldwin, M.D.  Primary Physician: Jenny Baldwin, M.D.  Reason for visit:    History of present illness:  Jenny Baldwin presents for the above issues. Patient complains of constipation and intermittent diarrhea related to the constipation. Patient admits to persistent abdominal bloating, altered bowel habits with sensation of incomplete defecation and relief of abdominal pain with defecation. There is mucus in the stools. There is also complaint of bright red blood in the stool. Patient denies any involuntary weight loss. No family history of colorectal cancer or inflammatory bowel disease. Patient takes Imodium over-the-counter with minimal results unless she takes 3 or 4 tablets in a day. This seems to slow the diarrhea but does not completely eliminate it. There is some rebound constipation. Patient has taken MiraLAX during pregnancy 10 years ago and claims that it caused too much diarrhea. She has not tried bulk agent laxatives. She reports a history of hemorrhoids related to 6 previous childbirths. From an upper GI standpoint, patient has recurrent nausea. She has a history of GERD that appears to be controlled on omeprazole 20 mg over-the-counter daily. Denies any dysphagia or actual vomiting or hematemesis. Patient does have a classification of morbid obesity and does have a report of history of fatty liver. She has elevated liver associated enzymes as noted below consistent with fatty liver changes as it is very minimal elevation. Viral hepatitis panel was negative, ordered by Jenny Baldwin, physician assistant. GI profile of stool was negative for bacteria, Ova and parasites. Hgb A1C slightly elevated at 6.1 on 09/09/2022. Patient reports personal history of obstructive sleep apnea and is on CPAP therapy at home and tolerates this well.     Current Facility-Administered Medications:    0.9 %  sodium chloride infusion, ,  Intravenous, Continuous, Jenny Baldwin, Jenny Nearing, MD, Last Rate: 20 mL/hr at 01/22/23 1431, New Bag at 01/22/23 1431  Medications Prior to Admission  Medication Sig Dispense Refill Last Dose   ARIPiprazole (ABILIFY) 5 MG tablet Take by mouth.   01/22/2023 at 0930   lamoTRIgine (LAMICTAL) 100 MG tablet Take 100 mg by mouth 2 (two) times daily.   01/22/2023 at 0930   metoprolol succinate (TOPROL-XL) 25 MG 24 hr tablet Take by mouth.   01/22/2023 at 0930   albuterol (VENTOLIN HFA) 108 (90 Base) MCG/ACT inhaler TAKE 2 PUFFS BY MOUTH EVERY 4 TO 6 HOURS AS NEEDED      furosemide (LASIX) 20 MG tablet Take by mouth.      nortriptyline (PAMELOR) 50 MG capsule Take 50 mg at night      zolmitriptan (ZOMIG-ZMT) 2.5 MG disintegrating tablet Take 1 tablet (2.5 mg total) by mouth as needed for migraine. May repeat dose X one after 2 hours prn 9 tablet 2      Allergies  Allergen Reactions   Latex Itching and Rash   Penicillins Swelling    Has patient had a PCN reaction causing immediate rash, facial/tongue/throat swelling, SOB or lightheadedness with hypotension: Unknown Has patient had a PCN reaction causing severe rash involving mucus membranes or skin necrosis: Unknown Has patient had a PCN reaction that required hospitalization: Unknown Has patient had a PCN reaction occurring within the last 10 years: Unknown If all of the above answers are "NO", then may proceed with Cephalosporin use.    Ibuprofen Nausea Only   Levetiracetam Other (See Comments)    Severe mood changes.    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    Physical Exam  Gen: Alert, oriented. Appears stated age.  HEENT: Jenny Baldwin/AT. PERRLA. Lungs: CTA, no wheezes. CV: RR nl S1, S2. Abd: soft, benign, no masses. BS+ Ext: No edema. Pulses 2+    Planned procedures:[name] is a 37 y.o. female with a PMHx of OSA was seen today for evaluation of:  Orders   Diagnoses and all orders for this visit:  Gastroesophageal reflux disease, unspecified whether esophagitis present - Ambulatory Referral to Colonoscopy and Upper Endoscopy  Nausea  Bipolar affective disorder, remission status unspecified (CMS/HHS-HCC)  Class 3 severe obesity due to excess calories without serious comorbidity with body mass index (BMI) of 40.0 to 44.9 in adult (CMS/HHS-HCC)  Obstructive sleep apnea syndrome  Hematochezia - Ambulatory Referral to Colonoscopy and Upper Endoscopy  Tobacco abuse  Transaminasemia Comments: likely secondary to fatty liver, no further workup is considered necessary. Consider exercise, weight loss.  Other orders - sodium, potassium, and magnesium (SUPREP) oral solution; Take 1 Bottle by mouth as directed One kit contains 2 bottles. Take both bottles at the times instructed by your provider.  Begin Citrucel laxative one to two tablespoons daily mixed in water.  Proceed with EGD and colonoscopy. The patient understands the nature of the planned procedure, indications, risks, alternatives and potential complications, including but not limited to bleeding, perforation, infection, damage to internal organs and oversedation. The patient wishes to proceed.      Jenny Baldwin, M.D. Gastroenterology 01/22/2023  2:38 PM

## 2023-01-22 NOTE — Anesthesia Preprocedure Evaluation (Addendum)
Anesthesia Evaluation  Patient identified by MRN, date of birth, ID band Patient awake    Reviewed: Allergy & Precautions, NPO status , Patient's Chart, lab work & pertinent test results  History of Anesthesia Complications Negative for: history of anesthetic complications  Airway Mallampati: I   Neck ROM: Full    Dental  (+) Edentulous Upper, Edentulous Lower   Pulmonary asthma , former smoker (quit 2023) Current vaping   Pulmonary exam normal breath sounds clear to auscultation       Cardiovascular hypertension, Normal cardiovascular exam Rhythm:Regular Rate:Normal  Echo 08/25/20:  NORMAL LEFT VENTRICULAR SYSTOLIC FUNCTION  NORMAL RIGHT VENTRICULAR SYSTOLIC FUNCTION  TRIVIAL REGURGITATION NOTED  NO VALVULAR STENOSIS  TRIVIAL MR, TR  EF 55%     Neuro/Psych Seizures - (last sz few months ago),  PSYCHIATRIC DISORDERS (OCD, PTSD) Anxiety Depression Bipolar Disorder   Chronic pain    GI/Hepatic ,GERD  ,,  Endo/Other  Prediabetes; class 3 obesity  Renal/GU negative Renal ROS     Musculoskeletal   Abdominal   Peds  Hematology negative hematology ROS (+)   Anesthesia Other Findings Cardiology note 07/22/22:  37 y.o. female with  1. Heart palpitations  2. Need for vaccination  3. Tobacco abuse  4. Peripheral edema   37 year old female with a several year history of palpitations and heart racing, previously underwent cardiovascular evaluation and was apparently treated with a beta-blocker with clinical improvement, but discontinued taking her medications due to lack of insurance. 72-hour Holter monitor was unremarkable, revealing predominant sinus rhythm with a mean heart rate of 79 bpm with heart rate ranging from 54 to 127 bpm. 2D echocardiogram revealed normal left ventricular function, with trivial valvular insufficiencies. Patient's chief complaint is bilateral peripheral edema.  Plan   1. Continue current  medications 2. Counseled patient about low-sodium diet 3. DASH diet printed instructions given to patient 4. Hydrochlorothiazide 25 mg daily as needed 5. Return to clinic for follow-up in 4 months  No orders of the defined types were placed in this encounter.  Return in about 4 months (around 11/21/2022).    Reproductive/Obstetrics                             Anesthesia Physical Anesthesia Plan  ASA: 3  Anesthesia Plan: General   Post-op Pain Management:    Induction: Intravenous  PONV Risk Score and Plan: 2 and Propofol infusion, TIVA and Treatment may vary due to age or medical condition  Airway Management Planned: Natural Airway  Additional Equipment:   Intra-op Plan:   Post-operative Plan:   Informed Consent: I have reviewed the patients History and Physical, chart, labs and discussed the procedure including the risks, benefits and alternatives for the proposed anesthesia with the patient or authorized representative who has indicated his/her understanding and acceptance.       Plan Discussed with: CRNA  Anesthesia Plan Comments: (LMA/GETA backup discussed.  Patient consented for risks of anesthesia including but not limited to:  - adverse reactions to medications - damage to eyes, teeth, lips or other oral mucosa - nerve damage due to positioning  - sore throat or hoarseness - damage to heart, brain, nerves, lungs, other parts of body or loss of life  Informed patient about role of CRNA in peri- and intra-operative care.  Patient voiced understanding.)        Anesthesia Quick Evaluation

## 2023-01-22 NOTE — Op Note (Signed)
[facility] Gastroenterology Patient Name: Jenny Baldwin Procedure Date: 01/22/2023 2:35 PM MRN: [id number] Account #: [id number] Date of Birth: May 02, 1986 Admit Type: Outpatient Age: 37 Room: [facility] ENDO ROOM 4 Gender: Female Note Status: Finalized Instrument Name: Upper Endoscope [id number] Procedure:             Upper GI endoscopy Indications:           Suspected gastro-esophageal reflux disease, Failure to                         respond to medical treatment Providers:             Boykin Nearing. Khasir Woodrome MD, MD Medicines:             Propofol per Anesthesia Complications:         No immediate complications. Procedure:             Pre-Anesthesia Assessment:                        - The risks and benefits of the procedure and the                         sedation options and risks were discussed with the                         patient. All questions were answered and informed                         consent was obtained.                        - Patient identification and proposed procedure were                         verified prior to the procedure by the nurse. The                         procedure was verified in the procedure room.                        - ASA Grade Assessment: III - A patient with severe                         systemic disease.                        - After reviewing the risks and benefits, the patient                         was deemed in satisfactory condition to undergo the                         procedure.                        After obtaining informed consent, the endoscope was                         passed under direct vision. Throughout the procedure,  the patient's blood pressure, pulse, and oxygen                         saturations were monitored continuously. The Endoscope                         was introduced through the mouth, and advanced to the                         third part of duodenum. The upper GI  endoscopy was                         accomplished without difficulty. The patient tolerated                         the procedure well. Findings:      The examined esophagus was normal. Biopsies were obtained from the       proximal and distal esophagus with cold forceps for histology of       suspected eosinophilic esophagitis.      Diffuse moderate inflammation characterized by congestion (edema) and       erythema was found in the entire examined stomach. Two biopsies were       obtained with cold forceps for Helicobacter pylori testing in the       gastric body.      A 1 cm hiatal hernia was present.      The examined duodenum was normal.      The exam was otherwise without abnormality. Impression:            - Normal esophagus.                        - Gastritis.                        - 1 cm hiatal hernia.                        - Normal examined duodenum.                        - The examination was otherwise normal.                        - Biopsies were taken with a cold forceps for                         evaluation of eosinophilic esophagitis.                        - Biopsies performed in the gastric body. Recommendation:        - Await pathology results.                        - Proceed with colonoscopy Procedure Code(s):     --- Professional ---                        [id number], Esophagogastroduodenoscopy, flexible,  transoral; with biopsy, single or multiple Diagnosis Code(s):     --- Professional ---                        K29.70, Gastritis, unspecified, without bleeding                        K44.9, Diaphragmatic hernia without obstruction or                         gangrene CPT copyright 2022 American Medical Association. All rights reserved. The codes documented in this report are preliminary and upon coder review may  be revised to meet current compliance requirements. Stanton Kidney MD, MD 01/22/2023 3:05:25 PM This report has been signed  electronically. Number of Addenda: 0 Note Initiated On: 01/22/2023 2:35 PM Estimated Blood Loss:  Estimated blood loss: none.      [facility]

## 2023-01-22 NOTE — Anesthesia Procedure Notes (Signed)
Procedure Name: MAC Date/Time: 01/22/2023 2:52 PM  Performed by: Elmarie Mainland, CRNAPre-anesthesia Checklist: Patient identified, Emergency Drugs available, Suction available and Patient being monitored Patient Re-evaluated:Patient Re-evaluated prior to induction Oxygen Delivery Method: Nasal cannula

## 2023-01-23 ENCOUNTER — Encounter: Payer: Self-pay | Admitting: Internal Medicine

## 2023-02-10 NOTE — Anesthesia Postprocedure Evaluation (Signed)
Anesthesia Post Note  Patient: Jenny Baldwin  Procedure(s) Performed: COLONOSCOPY ESOPHAGOGASTRODUODENOSCOPY (EGD) WITH PROPOFOL  Patient location during evaluation: Endoscopy Anesthesia Type: General Level of consciousness: awake and alert Pain management: pain level controlled Vital Signs Assessment: post-procedure vital signs reviewed and stable Respiratory status: spontaneous breathing, nonlabored ventilation, respiratory function stable and patient connected to nasal cannula oxygen Cardiovascular status: blood pressure returned to baseline and stable Postop Assessment: no apparent nausea or vomiting Anesthetic complications: no   No notable events documented.   Last Vitals:  Vitals:   01/22/23 1534 01/22/23 1545  BP: 103/79 120/87  Pulse: 85 84  Resp: (!) 29 14  Temp:    SpO2:  97%    Last Pain:  Vitals:   01/22/23 1545  TempSrc:   PainSc: 0-No pain                 Lenard Simmer

## 2023-06-23 ENCOUNTER — Ambulatory Visit: Payer: Self-pay | Admitting: Surgery

## 2023-06-23 NOTE — H&P (Signed)
Subjective:  CC: Grade II hemorrhoids [K64.1]    HPI:   Subjective Jenny Baldwin is a 37 y.o. female who was referrred by Wilford Corner, * for above. Symptoms were first noted several years ago. she has some rectal bleeding occurring a few times per week. Bleeding is described as light. Pain is sharp, achy, and intermittent, confined to the perianal area, without radiation.  Associated with nothing, exacerbated by nothing.   Past Medical History:  has a past medical history of Asthma, unspecified asthma severity, unspecified whether complicated, unspecified whether persistent (HHS-HCC), Depression, History of bipolar disorder, Irregular heart beat, PTSD (post-traumatic stress disorder), and Seizures (CMS/HHS-HCC).   Past Surgical History:  has a past surgical history that includes Cesarean section; Colonoscopy; Colon @ [facility] (01/22/2023); and EGD @ [facility] (01/22/2023).   Family History: family history includes Bipolar disorder in her father; Diabetes type II in her father and mother; High blood pressure (Hypertension) in her father; Myocardial Infarction (Heart attack) in her mother; No Known Problems in her brother, brother, brother, and sister; Seizures in her father, mother, and sister; Stroke in her mother.   Social History:  reports that she has quit smoking. Her smoking use included cigarettes. She has never used smokeless tobacco. She reports that she does not drink alcohol and does not use drugs.   Current Medications: has a current medication list which includes the following prescription(s): hydrochlorothiazide, lamotrigine, losartan, metoprolol succinate, nortriptyline, omeprazole, potassium chloride, and proair hfa.   Allergies:       Allergies as of 06/23/2023 - Reviewed 06/23/2023  Allergen Reaction Noted   Penicillins Swelling 09/04/2011   Latex Itching, Rash, and Unknown 09/04/2011   Latex, natural rubber Itching and Rash 09/04/2011   Ibuprofen Nausea 06/04/2018    Levetiracetam Other (See Comments) 01/07/2020   Penicillin Unknown 09/09/2022   Hydrocodone Rash 06/24/2016      ROS:  A 15 point review of systems was performed and pertinent positives and negatives noted in HPI   Objective:      Objective BP 127/82   Pulse 96   Ht 157.5 cm (5' 2.01")   Wt (!) 106.7 kg (235 lb 3.7 oz)   LMP 06/13/2023   BMI 43.01 kg/m    Constitutional :  No distress, cooperative, alert  Lymphatics/Throat:  Supple with no lymphadenopathy  Respiratory:  Clear to auscultation bilaterally  Cardiovascular:  Regular rate and rhythm  Gastrointestinal: Soft, non-tender, non-distended, no organomegaly.  Musculoskeletal: Steady gait and movement  Skin: Cool and moist  Psychiatric: Normal affect, non-agitated, not confused  Rectal: Chaperone present for exam.  External exam noted to have several external hemorrhoids, with one larger one that is non-tender, no ulceration.  DRE revealed, normal rectal tone, with palpable internal hemorrhoid noted at bilateral portion with minimal discomfort.  After obtaining verbal consent, an anoscope was inserted after prepped with lubricant and internal hemorrhoids noted on DRE confirmed visually, with no evidence of thrombosis. No other pathology such as fissures, fistulas, polyps noted.  Scope withdrawn and patient tolerate procedure well.        LABS:  N/a    RADS: N/a   Assessment:      Assessment Grade II hemorrhoids [K64.1]  Plan:      Plan 1. Grade II hemorrhoids [K64.1] Discussed risks/benefits/alternatives to surgery.  Alternatives include the options of observation, medical management.  Benefits include symptomatic relief.  I discussed  in detail and the complications related to the operation and the anesthesia, including bleeding, infection,  recurrence, remote possibility of temporary or permanent fecal incontinence, poor/delayed wound healing, chronic pain, and additional procedures to address said risks. The risks  of general anesthetic, if used, includes MI, CVA, sudden death or even reaction to anesthetic medications also discussed.   We also discussed typical post operative recovery which includes weeks to potentially months of anal pain, drainage, occasional bleeding, and sense of fecal urgency.     ED return precautions given for sudden increase in pain, bleeding, with possible accompanying fever, nausea, and/or vomiting.   The patient understands the risks, any and all questions were answered to the patient's satisfaction.   2. Patient has elected to proceed with surgical treatment. Procedure will be scheduled. PRONE position due to weight   labs/images/medications/previous chart entries reviewed personally and relevant changes/updates noted above.

## 2023-06-23 NOTE — H&P (View-Only) (Signed)
 Subjective:  CC: Grade II hemorrhoids [K64.1]    HPI:   Subjective Jenny Baldwin is a 37 y.o. female who was referrred by Wilford Corner, * for above. Symptoms were first noted several years ago. she has some rectal bleeding occurring a few times per week. Bleeding is described as light. Pain is sharp, achy, and intermittent, confined to the perianal area, without radiation.  Associated with nothing, exacerbated by nothing.   Past Medical History:  has a past medical history of Asthma, unspecified asthma severity, unspecified whether complicated, unspecified whether persistent (HHS-HCC), Depression, History of bipolar disorder, Irregular heart beat, PTSD (post-traumatic stress disorder), and Seizures (CMS/HHS-HCC).   Past Surgical History:  has a past surgical history that includes Cesarean section; Colonoscopy; Colon @ [facility] (01/22/2023); and EGD @ [facility] (01/22/2023).   Family History: family history includes Bipolar disorder in her father; Diabetes type II in her father and mother; High blood pressure (Hypertension) in her father; Myocardial Infarction (Heart attack) in her mother; No Known Problems in her brother, brother, brother, and sister; Seizures in her father, mother, and sister; Stroke in her mother.   Social History:  reports that she has quit smoking. Her smoking use included cigarettes. She has never used smokeless tobacco. She reports that she does not drink alcohol and does not use drugs.   Current Medications: has a current medication list which includes the following prescription(s): hydrochlorothiazide, lamotrigine, losartan, metoprolol succinate, nortriptyline, omeprazole, potassium chloride, and proair hfa.   Allergies:       Allergies as of 06/23/2023 - Reviewed 06/23/2023  Allergen Reaction Noted   Penicillins Swelling 09/04/2011   Latex Itching, Rash, and Unknown 09/04/2011   Latex, natural rubber Itching and Rash 09/04/2011   Ibuprofen Nausea 06/04/2018    Levetiracetam Other (See Comments) 01/07/2020   Penicillin Unknown 09/09/2022   Hydrocodone Rash 06/24/2016      ROS:  A 15 point review of systems was performed and pertinent positives and negatives noted in HPI   Objective:      Objective BP 127/82   Pulse 96   Ht 157.5 cm (5' 2.01")   Wt (!) 106.7 kg (235 lb 3.7 oz)   LMP 06/13/2023   BMI 43.01 kg/m    Constitutional :  No distress, cooperative, alert  Lymphatics/Throat:  Supple with no lymphadenopathy  Respiratory:  Clear to auscultation bilaterally  Cardiovascular:  Regular rate and rhythm  Gastrointestinal: Soft, non-tender, non-distended, no organomegaly.  Musculoskeletal: Steady gait and movement  Skin: Cool and moist  Psychiatric: Normal affect, non-agitated, not confused  Rectal: Chaperone present for exam.  External exam noted to have several external hemorrhoids, with one larger one that is non-tender, no ulceration.  DRE revealed, normal rectal tone, with palpable internal hemorrhoid noted at bilateral portion with minimal discomfort.  After obtaining verbal consent, an anoscope was inserted after prepped with lubricant and internal hemorrhoids noted on DRE confirmed visually, with no evidence of thrombosis. No other pathology such as fissures, fistulas, polyps noted.  Scope withdrawn and patient tolerate procedure well.        LABS:  N/a    RADS: N/a   Assessment:      Assessment Grade II hemorrhoids [K64.1]  Plan:      Plan 1. Grade II hemorrhoids [K64.1] Discussed risks/benefits/alternatives to surgery.  Alternatives include the options of observation, medical management.  Benefits include symptomatic relief.  I discussed  in detail and the complications related to the operation and the anesthesia, including bleeding, infection,  recurrence, remote possibility of temporary or permanent fecal incontinence, poor/delayed wound healing, chronic pain, and additional procedures to address said risks. The risks  of general anesthetic, if used, includes MI, CVA, sudden death or even reaction to anesthetic medications also discussed.   We also discussed typical post operative recovery which includes weeks to potentially months of anal pain, drainage, occasional bleeding, and sense of fecal urgency.     ED return precautions given for sudden increase in pain, bleeding, with possible accompanying fever, nausea, and/or vomiting.   The patient understands the risks, any and all questions were answered to the patient's satisfaction.   2. Patient has elected to proceed with surgical treatment. Procedure will be scheduled. PRONE position due to weight   labs/images/medications/previous chart entries reviewed personally and relevant changes/updates noted above.

## 2023-06-26 ENCOUNTER — Encounter
Admission: RE | Admit: 2023-06-26 | Discharge: 2023-06-26 | Disposition: A | Discharge status: Home / Self Care | Payer: Medicaid Other | Source: Ambulatory Visit | Attending: Surgery | Admitting: Surgery

## 2023-06-26 VITALS — Ht 62.0 in | Wt 238.1 lb

## 2023-06-26 DIAGNOSIS — I10 Essential (primary) hypertension: Secondary | ICD-10-CM

## 2023-06-26 DIAGNOSIS — E876 Hypokalemia: Secondary | ICD-10-CM

## 2023-06-26 DIAGNOSIS — Z01810 Encounter for preprocedural cardiovascular examination: Secondary | ICD-10-CM

## 2023-06-26 DIAGNOSIS — K760 Fatty (change of) liver, not elsewhere classified: Secondary | ICD-10-CM

## 2023-06-26 DIAGNOSIS — Z01812 Encounter for preprocedural laboratory examination: Secondary | ICD-10-CM

## 2023-06-26 DIAGNOSIS — Z01818 Encounter for other preprocedural examination: Secondary | ICD-10-CM

## 2023-06-26 HISTORY — DX: Obesity, unspecified: E66.9

## 2023-06-26 HISTORY — DX: Localized edema: R60.0

## 2023-06-26 HISTORY — DX: Unspecified hemorrhoids: K64.9

## 2023-06-26 HISTORY — DX: Vitamin D deficiency, unspecified: E55.9

## 2023-06-26 HISTORY — DX: Fatty (change of) liver, not elsewhere classified: K76.0

## 2023-06-26 HISTORY — DX: Tobacco use: Z72.0

## 2023-06-26 HISTORY — DX: Hyperlipidemia, unspecified: E78.5

## 2023-06-26 HISTORY — DX: Obstructive sleep apnea (adult) (pediatric): G47.33

## 2023-06-26 HISTORY — DX: Essential (primary) hypertension: I10

## 2023-06-26 HISTORY — DX: Palpitations: R00.2

## 2023-06-26 NOTE — Patient Instructions (Signed)
Your procedure is scheduled on:07-04-23 Friday Report to the Registration Desk on the 1st floor of the [facility].Then proceed to the 2nd floor Surgery Desk To find out your arrival time, please call [phone] between 1PM - 3PM on:07-03-23 Thursday If your arrival time is 6:00 am, do not arrive before that time as the [facility] entrance doors do not open until 6:00 am.  REMEMBER: Instructions that are not followed completely may result in serious medical risk, up to and including death; or upon the discretion of your surgeon and anesthesiologist your surgery may need to be rescheduled.  Do not eat food after midnight the night before surgery.  No gum chewing or hard candies.  You may however, drink CLEAR liquids up to 2 hours before you are scheduled to arrive for your surgery. Do not drink anything within 2 hours of your scheduled arrival time.  Clear liquids include: - water  - apple juice without pulp - gatorade (not RED colors) - black coffee or tea (Do NOT add milk or creamers to the coffee or tea) Do NOT drink anything that is not on this list.  One week prior to surgery: Stop Anti-inflammatories (NSAIDS) such as Advil, Aleve, Ibuprofen, Motrin, Naproxen, Naprosyn and Aspirin based products such as Excedrin, Goody's Powder, BC Powder.Continue your celecoxib (CELEBREX)  Stop ANY OVER THE COUNTER supplements until after surgery (Continue your Potassium Chloride since this is a prescription)  You may however, continue to take Tylenol if needed for pain up until the day of surgery.  Continue taking all of your other prescription medications up until the day of surgery.  ON THE DAY OF SURGERY ONLY TAKE THESE MEDICATIONS WITH SIPS OF WATER: -celecoxib (CELEBREX)  -lamoTRIgine (LAMICTAL)  -metoprolol succinate (TOPROL-XL)  -omeprazole (PRILOSEC)  -potassium chloride (KLOR-CON)   Use your Albuterol Inhaler the day of surgery and bring your Inhaler to the hospital  No  Alcohol for 24 hours before or after surgery.  No Smoking including e-cigarettes for 24 hours before surgery.  No chewable tobacco products for at least 6 hours before surgery.  No nicotine patches on the day of surgery.  Do not use any "recreational" drugs for at least a week (preferably 2 weeks) before your surgery.  Please be advised that the combination of cocaine and anesthesia may have negative outcomes, up to and including death. If you test positive for cocaine, your surgery will be cancelled.  On the morning of surgery brush your teeth with toothpaste and water, you may rinse your mouth with mouthwash if you wish. Do not swallow any toothpaste or mouthwash.  Do not wear jewelry, make-up, hairpins, clips or nail polish.  For welded (permanent) jewelry: bracelets, anklets, waist bands, etc.  Please have this removed prior to surgery.  If it is not removed, there is a chance that hospital personnel will need to cut it off on the day of surgery.  Do not wear lotions, powders, or perfumes.   Do not shave body hair from the neck down 48 hours before surgery.  Contact lenses, hearing aids and dentures may not be worn into surgery.  Do not bring valuables to the hospital. [facility] is not responsible for any missing/lost belongings or valuables.   Bring your C-PAP to the hospital   Notify your doctor if there is any change in your medical condition (cold, fever, infection).  Wear comfortable clothing (specific to your surgery type) to the hospital.  After surgery, you can help prevent lung complications  by doing breathing exercises.  Take deep breaths and cough every 1-2 hours. Your doctor may order a device called an Incentive Spirometer to help you take deep breaths. When coughing or sneezing, hold a pillow firmly against your incision with both hands. This is called "splinting." Doing this helps protect your incision. It also decreases belly discomfort.  If you are being  admitted to the hospital overnight, leave your suitcase in the car. After surgery it may be brought to your room.  In case of increased patient census, it may be necessary for you, the patient, to continue your postoperative care in the Same Day Surgery department.  If you are being discharged the day of surgery, you will not be allowed to drive home. You will need a responsible individual to drive you home and stay with you for 24 hours after surgery.   If you are taking public transportation, you will need to have a responsible individual with you.  Please call the Pre-admissions Testing Dept. at [phone] if you have any questions about these instructions.  Surgery Visitation Policy:  Patients having surgery or a procedure may have two visitors.  Children under the age of 8 must have an adult with them who is not the patient.

## 2023-06-27 ENCOUNTER — Encounter
Admission: RE | Admit: 2023-06-27 | Discharge: 2023-06-27 | Disposition: A | Discharge status: Home / Self Care | Payer: Medicaid Other | Source: Ambulatory Visit | Attending: Surgery | Admitting: Surgery

## 2023-06-27 DIAGNOSIS — E876 Hypokalemia: Secondary | ICD-10-CM | POA: Diagnosis not present

## 2023-06-27 DIAGNOSIS — K760 Fatty (change of) liver, not elsewhere classified: Secondary | ICD-10-CM | POA: Insufficient documentation

## 2023-06-27 DIAGNOSIS — T502X5A Adverse effect of carbonic-anhydrase inhibitors, benzothiadiazides and other diuretics, initial encounter: Secondary | ICD-10-CM | POA: Insufficient documentation

## 2023-06-27 DIAGNOSIS — Z79899 Other long term (current) drug therapy: Secondary | ICD-10-CM | POA: Diagnosis not present

## 2023-06-27 DIAGNOSIS — Z01810 Encounter for preprocedural cardiovascular examination: Secondary | ICD-10-CM

## 2023-06-27 DIAGNOSIS — I10 Essential (primary) hypertension: Secondary | ICD-10-CM | POA: Insufficient documentation

## 2023-06-27 DIAGNOSIS — Z01812 Encounter for preprocedural laboratory examination: Secondary | ICD-10-CM

## 2023-06-27 DIAGNOSIS — Z01818 Encounter for other preprocedural examination: Secondary | ICD-10-CM | POA: Insufficient documentation

## 2023-06-27 LAB — COMPREHENSIVE METABOLIC PANEL
ALT: 67 U/L — ABNORMAL HIGH (ref 0–44)
AST: 69 U/L — ABNORMAL HIGH (ref 15–41)
Albumin: 3.9 g/dL (ref 3.5–5.0)
Alkaline Phosphatase: 49 U/L (ref 38–126)
Anion gap: 12 (ref 5–15)
BUN: 11 mg/dL (ref 6–20)
CO2: 25 mmol/L (ref 22–32)
Calcium: 9.4 mg/dL (ref 8.9–10.3)
Chloride: 96 mmol/L — ABNORMAL LOW (ref 98–111)
Creatinine, Ser: 0.8 mg/dL (ref 0.44–1.00)
GFR, Estimated: >60 mL/min (ref >60)
Glucose, Bld: 103 mg/dL — ABNORMAL HIGH (ref 70–99)
Potassium: 2.9 mmol/L — ABNORMAL LOW (ref 3.5–5.1)
Sodium: 133 mmol/L — ABNORMAL LOW (ref 135–145)
Total Bilirubin: 0.8 mg/dL (ref <1.2)
Total Protein: 8.7 g/dL — ABNORMAL HIGH (ref 6.5–8.1)

## 2023-06-27 LAB — MAGNESIUM: Magnesium: 2.2 mg/dL (ref 1.7–2.4)

## 2023-06-27 LAB — CBC
HCT: 40.9 % (ref 36.0–46.0)
Hemoglobin: 14.3 g/dL (ref 12.0–15.0)
MCH: 34.5 pg — ABNORMAL HIGH (ref 26.0–34.0)
MCHC: 35 g/dL (ref 30.0–36.0)
MCV: 98.6 fL (ref 80.0–100.0)
Platelets: 299 10*3/uL (ref 150–400)
RBC: 4.15 MIL/uL (ref 3.87–5.11)
RDW: 12.1 % (ref 11.5–15.5)
WBC: 9.4 10*3/uL (ref 4.0–10.5)
nRBC: 0 % (ref 0.0–0.2)

## 2023-06-27 NOTE — Progress Notes (Signed)
[facility] Perioperative Services: Pre-Admission/Anesthesia Testing  Abnormal Lab Notification and Treatment Plan of Care   Date: 06/27/23  Name: Jenny Baldwin MRN:   [id number]  Re: Abnormal labs noted during PAT appointment   Notified:  Provider Name Provider Role Notification Mode  Sung Amabile, DO General Surgery (Surgeon) Routed and/or faxed via Adalberto Ill, MD Primary Care Provider Routed and/or faxed via [facility]   Clinical Information and Notes:  ABNORMAL LAB VALUE(S): Lab Results  Component Value Date   K 2.9 (L) 06/27/2023   Jenny Baldwin is scheduled for an elective HEMORRHOIDECTOMY (Buttocks) on 07/04/2023. In review of her medication reconciliation, it is noted that the patient is taking prescribed diuretic medications (HCTZ) daily.   Please note, in efforts to promote a safe and effective anesthetic course, per current guidelines/standards set by the [facility] anesthesia team, the minimal acceptable K+ level for the patient to proceed with general anesthesia is 3.0 mmol/L. With that being said, if the patient drops any lower, her elective procedure will need to be postponed until K+ is better optimized. In efforts to prevent case cancellation, will make efforts to optimize pre-surgical K+ level so that patient can safely undergo the planned surgical intervention.   Impression and Plan:  Jenny Baldwin found to be HYPOkalemic at 2.9 mmol/L on preoperative labs.   Mg level was added and found to be normal at: 2.2 mg/dL.  Contacted patient to discuss results and plans for correction of noted electrolyte derangement. She is on thiazide diuretic therapy. Additionally, patient takes a daily K+ supplement.  In speaking with the patient, she tells me that she had misplaced her K-Dur until yesterday. She took a dose yesterday and today. Dose today was taken prior to preop labs being drawn. Discussed diuretic therapy as likely etiology in the setting  of normal Mg level and in the absence of GI related symptoms (no diarrhea). Patient denies regular use of laxative medications. Reviewed other potential causes, including decreased intake of dietary K+. Reviewed plans for preoperative optimization as follows:   Take an additional pill today for a total dose of 20 mEq  Starting tomorrow, increase K-Dur to 20 mEq daily until her surgery. Asked patient to include increased dose on the day of her procedure.   Encouraged patient to follow up with PCP about 2-3 weeks postoperatively to have labs rechecked to ensure that levels are remaining within normal range. Discussed nutritional intake of K+ rich foods as an adjunctive way to keep her K+ levels normal; list of K+ rich foods provided. Also mentioned ORS, however advised her not to rely solely on these drinks, as they are high in Na+, and she has a HTN diagnosis.   Will send copy of this note to surgeon and PCP to make them aware of K+ level and plans for correction. Discussed that PCP may elect to pursue a change in diuretic therapy to a K+ sparing type medication, or alternatively, they may consider increasing her daily K+ supplement if levels remain low on recheck. Order entered to recheck K+ on the day of her surgery to ensure optimization. Wished patient the best of luck with her upcoming surgery and subsequent recovery. She was encouraged to return call to the PAT clinic, or to her surgeon's office, should any questions or concerns arise between now and the time of her surgery. Patient was appreciative of the care/concern expressed by PAT staff.   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 Despite my  best ability to proofread, there is always the potential that unintentional transcriptional errors may still occur from this process.

## 2023-07-04 ENCOUNTER — Other Ambulatory Visit: Payer: Self-pay

## 2023-07-04 ENCOUNTER — Ambulatory Visit: Payer: Medicaid Other | Admitting: Anesthesiology

## 2023-07-04 ENCOUNTER — Ambulatory Visit: Payer: Medicaid Other | Admitting: Urgent Care

## 2023-07-04 ENCOUNTER — Encounter
Admission: RE | Disposition: A | Discharge status: Home / Self Care | Payer: Self-pay | Source: Ambulatory Visit | Attending: Surgery

## 2023-07-04 ENCOUNTER — Encounter: Payer: Self-pay | Admitting: Surgery

## 2023-07-04 ENCOUNTER — Ambulatory Visit
Admission: RE | Admit: 2023-07-04 | Discharge: 2023-07-04 | Disposition: A | Discharge status: Home / Self Care | Payer: Medicaid Other | Source: Ambulatory Visit | Attending: Surgery | Admitting: Surgery

## 2023-07-04 DIAGNOSIS — Z87891 Personal history of nicotine dependence: Secondary | ICD-10-CM | POA: Diagnosis not present

## 2023-07-04 DIAGNOSIS — J45909 Unspecified asthma, uncomplicated: Secondary | ICD-10-CM | POA: Diagnosis not present

## 2023-07-04 DIAGNOSIS — K641 Second degree hemorrhoids: Secondary | ICD-10-CM | POA: Diagnosis present

## 2023-07-04 DIAGNOSIS — Z79899 Other long term (current) drug therapy: Secondary | ICD-10-CM

## 2023-07-04 DIAGNOSIS — E876 Hypokalemia: Secondary | ICD-10-CM

## 2023-07-04 DIAGNOSIS — Z01818 Encounter for other preprocedural examination: Secondary | ICD-10-CM

## 2023-07-04 DIAGNOSIS — Z01812 Encounter for preprocedural laboratory examination: Secondary | ICD-10-CM

## 2023-07-04 HISTORY — PX: HEMORRHOID SURGERY: SHX153

## 2023-07-04 LAB — POCT I-STAT, CHEM 8
BUN: 11 mg/dL (ref 6–20)
Calcium, Ion: 1.14 mmol/L — ABNORMAL LOW (ref 1.15–1.40)
Chloride: 99 mmol/L (ref 98–111)
Creatinine, Ser: 0.9 mg/dL (ref 0.44–1.00)
Glucose, Bld: 115 mg/dL — ABNORMAL HIGH (ref 70–99)
HCT: 42 % (ref 36.0–46.0)
Hemoglobin: 14.3 g/dL (ref 12.0–15.0)
Potassium: 3.3 mmol/L — ABNORMAL LOW (ref 3.5–5.1)
Sodium: 138 mmol/L (ref 135–145)
TCO2: 26 mmol/L (ref 22–32)

## 2023-07-04 LAB — POCT PREGNANCY, URINE: Preg Test, Ur: NEGATIVE

## 2023-07-04 SURGERY — HEMORRHOIDECTOMY
Anesthesia: General | Site: Buttocks

## 2023-07-04 MED ORDER — LACTATED RINGERS IV SOLN: INTRAVENOUS | Status: DC

## 2023-07-04 MED ORDER — OXYCODONE HCL 5 MG PO TABS
5.0000 mg | ORAL_TABLET | Freq: Once | ORAL | Status: AC | PRN
  Administered 2023-07-04: 5 mg via ORAL

## 2023-07-04 MED ORDER — DOCUSATE SODIUM 100 MG PO CAPS: 100.0000 mg | ORAL_CAPSULE | Freq: Two times a day (BID) | ORAL | 0 refills | Status: AC | PRN

## 2023-07-04 MED ORDER — OXYCODONE HCL 5 MG/5ML PO SOLN: 5.0000 mg | Freq: Once | ORAL | Status: AC | PRN

## 2023-07-04 MED ORDER — BUPIVACAINE-EPINEPHRINE (PF) 0.5% -1:200000 IJ SOLN
INTRAMUSCULAR | Status: AC
  Filled 2023-07-04: qty 30

## 2023-07-04 MED ORDER — GABAPENTIN 300 MG PO CAPS
ORAL_CAPSULE | ORAL | Status: AC
  Filled 2023-07-04: qty 1

## 2023-07-04 MED ORDER — PHENYLEPHRINE 80 MCG/ML (10ML) SYRINGE FOR IV PUSH (FOR BLOOD PRESSURE SUPPORT)
PREFILLED_SYRINGE | INTRAVENOUS | Status: DC | PRN
  Administered 2023-07-04: 120 ug via INTRAVENOUS

## 2023-07-04 MED ORDER — CELECOXIB 200 MG PO CAPS: 200.0000 mg | ORAL_CAPSULE | ORAL | Status: DC

## 2023-07-04 MED ORDER — CHLORHEXIDINE GLUCONATE CLOTH 2 % EX PADS: 6.0000 | MEDICATED_PAD | Freq: Once | CUTANEOUS | Status: DC

## 2023-07-04 MED ORDER — GELATIN ABSORBABLE 12-7 MM EX MISC
CUTANEOUS | Status: AC
Start: 2023-07-04 — End: ?
  Filled 2023-07-04: qty 1

## 2023-07-04 MED ORDER — ROCURONIUM BROMIDE 100 MG/10ML IV SOLN
INTRAVENOUS | Status: DC | PRN
  Administered 2023-07-04: 50 mg via INTRAVENOUS

## 2023-07-04 MED ORDER — OXYCODONE HCL 5 MG PO TABS
ORAL_TABLET | ORAL | Status: AC
  Filled 2023-07-04: qty 1

## 2023-07-04 MED ORDER — DEXMEDETOMIDINE HCL IN NACL 80 MCG/20ML IV SOLN
INTRAVENOUS | Status: DC | PRN
  Administered 2023-07-04: 10 ug via INTRAVENOUS

## 2023-07-04 MED ORDER — FENTANYL CITRATE (PF) 100 MCG/2ML IJ SOLN: 25.0000 ug | INTRAMUSCULAR | Status: DC | PRN

## 2023-07-04 MED ORDER — PROPOFOL 10 MG/ML IV BOLUS
INTRAVENOUS | Status: DC | PRN
  Administered 2023-07-04: 200 mg via INTRAVENOUS

## 2023-07-04 MED ORDER — CHLORHEXIDINE GLUCONATE 0.12 % MT SOLN
15.0000 mL | Freq: Once | OROMUCOSAL | Status: AC
  Administered 2023-07-04: 15 mL via OROMUCOSAL

## 2023-07-04 MED ORDER — LIDOCAINE HCL (CARDIAC) PF 100 MG/5ML IV SOSY
PREFILLED_SYRINGE | INTRAVENOUS | Status: DC | PRN
  Administered 2023-07-04: 100 mg via INTRAVENOUS

## 2023-07-04 MED ORDER — DEXAMETHASONE SODIUM PHOSPHATE 10 MG/ML IJ SOLN
INTRAMUSCULAR | Status: AC
  Filled 2023-07-04: qty 1

## 2023-07-04 MED ORDER — MIDAZOLAM HCL 2 MG/2ML IJ SOLN
INTRAMUSCULAR | Status: AC
  Filled 2023-07-04: qty 2

## 2023-07-04 MED ORDER — MIDAZOLAM HCL 2 MG/2ML IJ SOLN
INTRAMUSCULAR | Status: DC | PRN
  Administered 2023-07-04: 2 mg via INTRAVENOUS

## 2023-07-04 MED ORDER — FENTANYL CITRATE (PF) 100 MCG/2ML IJ SOLN
INTRAMUSCULAR | Status: AC
  Filled 2023-07-04: qty 2

## 2023-07-04 MED ORDER — GABAPENTIN 300 MG PO CAPS
300.0000 mg | ORAL_CAPSULE | ORAL | Status: AC
  Administered 2023-07-04: 300 mg via ORAL

## 2023-07-04 MED ORDER — CHLORHEXIDINE GLUCONATE 0.12 % MT SOLN
OROMUCOSAL | Status: AC
Start: 2023-07-04 — End: ?
  Filled 2023-07-04: qty 15

## 2023-07-04 MED ORDER — ORAL CARE MOUTH RINSE: 15.0000 mL | Freq: Once | OROMUCOSAL | Status: AC

## 2023-07-04 MED ORDER — DEXMEDETOMIDINE HCL IN NACL 80 MCG/20ML IV SOLN
INTRAVENOUS | Status: AC
  Filled 2023-07-04: qty 20

## 2023-07-04 MED ORDER — LIDOCAINE 5 % EX OINT: 1.0000 | TOPICAL_OINTMENT | Freq: Three times a day (TID) | CUTANEOUS | 0 refills | Status: AC | PRN

## 2023-07-04 MED ORDER — ACETAMINOPHEN 500 MG PO TABS
ORAL_TABLET | ORAL | Status: AC
  Filled 2023-07-04: qty 2

## 2023-07-04 MED ORDER — PROPOFOL 10 MG/ML IV BOLUS
INTRAVENOUS | Status: AC
Start: 2023-07-04 — End: ?
  Filled 2023-07-04: qty 20

## 2023-07-04 MED ORDER — BUPIVACAINE LIPOSOME 1.3 % IJ SUSP
INTRAMUSCULAR | Status: DC | PRN
  Administered 2023-07-04: 20 mL

## 2023-07-04 MED ORDER — FENTANYL CITRATE (PF) 100 MCG/2ML IJ SOLN
INTRAMUSCULAR | Status: DC | PRN
  Administered 2023-07-04: 50 ug via INTRAVENOUS

## 2023-07-04 MED ORDER — BUPIVACAINE-EPINEPHRINE (PF) 0.5% -1:200000 IJ SOLN
INTRAMUSCULAR | Status: DC | PRN
  Administered 2023-07-04: 20 mL

## 2023-07-04 MED ORDER — DEXAMETHASONE SODIUM PHOSPHATE 10 MG/ML IJ SOLN
INTRAMUSCULAR | Status: DC | PRN
  Administered 2023-07-04: 10 mg via INTRAVENOUS

## 2023-07-04 MED ORDER — ACETAMINOPHEN 325 MG PO TABS: 650.0000 mg | ORAL_TABLET | Freq: Three times a day (TID) | ORAL | 0 refills | Status: AC | PRN

## 2023-07-04 MED ORDER — ONDANSETRON HCL 4 MG/2ML IJ SOLN
INTRAMUSCULAR | Status: AC
Start: 2023-07-04 — End: ?
  Filled 2023-07-04: qty 2

## 2023-07-04 MED ORDER — BUPIVACAINE LIPOSOME 1.3 % IJ SUSP
INTRAMUSCULAR | Status: AC
Start: 2023-07-04 — End: ?
  Filled 2023-07-04: qty 20

## 2023-07-04 MED ORDER — GELATIN ABSORBABLE 12-7 MM EX MISC
CUTANEOUS | Status: DC | PRN
  Administered 2023-07-04: 1

## 2023-07-04 MED ORDER — ONDANSETRON HCL 4 MG/2ML IJ SOLN
INTRAMUSCULAR | Status: DC | PRN
  Administered 2023-07-04: 4 mg via INTRAVENOUS

## 2023-07-04 MED ORDER — TRAMADOL HCL 50 MG PO TABS: 50.0000 mg | ORAL_TABLET | Freq: Four times a day (QID) | ORAL | 0 refills | Status: AC | PRN

## 2023-07-04 MED ORDER — ROCURONIUM BROMIDE 10 MG/ML (PF) SYRINGE
PREFILLED_SYRINGE | INTRAVENOUS | Status: AC
Start: 2023-07-04 — End: ?
  Filled 2023-07-04: qty 10

## 2023-07-04 MED ORDER — ACETAMINOPHEN 500 MG PO TABS
1000.0000 mg | ORAL_TABLET | ORAL | Status: AC
  Administered 2023-07-04: 1000 mg via ORAL

## 2023-07-04 MED ORDER — SUGAMMADEX SODIUM 200 MG/2ML IV SOLN
INTRAVENOUS | Status: DC | PRN
  Administered 2023-07-04: 432 mg via INTRAVENOUS

## 2023-07-04 SURGICAL SUPPLY — 34 items
BLADE SURG 15 STRL LF DISP TIS (BLADE) ×1 IMPLANT
BLADE SURG 15 STRL SS (BLADE) ×1
BRIEF MESH DISP 2XL (UNDERPADS AND DIAPERS) ×1 IMPLANT
DRAPE LAPAROTOMY 77X122 PED (DRAPES) IMPLANT
DRAPE PERI LITHO V/GYN (MISCELLANEOUS) ×1 IMPLANT
DRAPE UNDER BUTTOCK W/FLU (DRAPES) ×1 IMPLANT
DRSG GAUZE FLUFF 36X18 (GAUZE/BANDAGES/DRESSINGS) ×1 IMPLANT
ELECT REM PT RETURN 9FT ADLT (ELECTROSURGICAL) ×1
ELECTRODE REM PT RTRN 9FT ADLT (ELECTROSURGICAL) ×1 IMPLANT
GAUZE 4X4 16PLY ~~LOC~~+RFID DBL (SPONGE) ×1 IMPLANT
GLOVE BIOGEL PI IND STRL 7.0 (GLOVE) ×1 IMPLANT
GLOVE SURG SYN 6.5 ES PF (GLOVE) ×3 IMPLANT
GLOVE SURG SYN 6.5 PF PI (GLOVE) ×1 IMPLANT
GOWN STRL REUS W/ TWL LRG LVL3 (GOWN DISPOSABLE) ×2 IMPLANT
GOWN STRL REUS W/TWL LRG LVL3 (GOWN DISPOSABLE) ×3
KIT TURNOVER CYSTO (KITS) ×1 IMPLANT
LABEL OR SOLS (LABEL) ×1 IMPLANT
MANIFOLD NEPTUNE II (INSTRUMENTS) ×1 IMPLANT
NDL HYPO 22X1.5 SAFETY MO (MISCELLANEOUS) ×1 IMPLANT
NEEDLE HYPO 22X1.5 SAFETY MO (MISCELLANEOUS) ×1 IMPLANT
NS IRRIG 500ML POUR BTL (IV SOLUTION) ×1 IMPLANT
PACK BASIN MINOR ARMC (MISCELLANEOUS) ×1 IMPLANT
PAD PREP OB/GYN DISP 24X41 (PERSONAL CARE ITEMS) ×1 IMPLANT
SHEARS HARMONIC 9CM CVD (BLADE) IMPLANT
SOL PREP PVP 2OZ (MISCELLANEOUS) ×1
SOLUTION PREP PVP 2OZ (MISCELLANEOUS) ×1 IMPLANT
SPONGE SURGIFOAM ABS GEL 12-7 (HEMOSTASIS) IMPLANT
SURGILUBE 2OZ TUBE FLIPTOP (MISCELLANEOUS) ×1 IMPLANT
SUT VIC AB 3-0 SH 27 (SUTURE) ×1
SUT VIC AB 3-0 SH 27X BRD (SUTURE) ×1 IMPLANT
SWAB CULTURE AMIES ANAERIB BLU (MISCELLANEOUS) IMPLANT
SYR 10ML LL (SYRINGE) ×1 IMPLANT
SYR 20ML LL LF (SYRINGE) ×1 IMPLANT
TRAP FLUID SMOKE EVACUATOR (MISCELLANEOUS) ×1 IMPLANT

## 2023-07-04 NOTE — Interval H&P Note (Signed)
No change. OK to proceed.

## 2023-07-04 NOTE — Anesthesia Procedure Notes (Addendum)
Procedure Name: Intubation Date/Time: 07/04/2023 7:41 AM  Performed by: Elisandro Jarrett, [place], CRNAPre-anesthesia Checklist: Patient identified, Patient being monitored, Timeout performed, Emergency Drugs available and Suction available Patient Re-evaluated:Patient Re-evaluated prior to induction Oxygen Delivery Method: Circle system utilized Preoxygenation: Pre-oxygenation with 100% oxygen Induction Type: IV induction Ventilation: Mask ventilation without difficulty and Oral airway inserted - appropriate to patient size Laryngoscope Size: McGrath and 3 Grade View: Grade I Tube type: Oral Tube size: 7.0 mm Number of attempts: 1 Airway Equipment and Method: Stylet Placement Confirmation: ETT inserted through vocal cords under direct vision, positive ETCO2 and breath sounds checked- equal and bilateral Secured at: 21 cm Tube secured with: Tape Dental Injury: Teeth and Oropharynx as per pre-operative assessment

## 2023-07-04 NOTE — Op Note (Signed)
Preoperative diagnosis: second degree hemorrhoids, skin tags  Postoperative diagnosis: same  Procedure: exam under anesthesia, two column hemorrhoidectomy.  Surgeon: Tonna Boehringer  Anesthesia: general  Specimen: hemorrhoids x2  Complications: none  EBL: 15mL  Wound classification: Clean Contaminated  Indications: Patient is a 37 y.o. female was found to have symptomatic hemorrhoids refractory to medical management.   Findings: 1. second  degree hemorrhoids and skin tags 2. Internal and external anal sphincter palpated and preserved 3. Adequate hemostasis  Description of procedure: The patient was brought to the operating room and general anesthesia was induced. Patient was placed prone position. A time-out was completed verifying correct patient, procedure, site, positioning, and implant(s) and/or special equipment prior to beginning this procedure. The perineum was prepped and draped in standard sterile fashion. Local anesthetic was injected as a perianal block. An anoscope was introduced and hemorrhoidal pedicles identified.  A harmonic was placed across the base of the left pedicle and excess hemorrhoidal tissue and skin tag removed.  Specimen was passed off operative field pending pathology.  3-0 Vicryl then used to close the open wound.  A harmonic was placed across the base of the right anteriorpedicle and excess hemorrhoidal and skin tag tissue removed.  Specimen was passed off operative field pending pathology.  3-0 Vicryl then used to close the open wound.  Hemostasis achieved. Last inspection of the anal canal did not note any additional hemorrhoidal tissue and no other pathology. Exparel injected as a perianal block. Surgifoam placed intro rectum and a gauze pad was tucked between the gluteal folds, and secured in place with mesh underwear.  The patient tolerated the procedure well and was taken to the postanesthesia care unit in stable condition.  Sponge and instrument count  correct at end of procedure.

## 2023-07-04 NOTE — Discharge Instructions (Addendum)
Hemorrhoids, Care After This sheet gives you information about how to care for yourself after your procedure. Your health care provider may also give you more specific instructions. If you have problems or questions, contact your health care provider. What can I expect after the procedure? After the procedure, it is common to have: Soreness. Bruising. Itching.  Follow these instructions at home: site care Follow instructions from your health care provider about how to take care of your site. Make sure you: LEAVE packing in place until it falls out on its own.  No need to replace afterwards Leave stitches (sutures), skin glue, or adhesive strips in place.  If the area bleeds or bruises, apply gentle pressure for 10 minutes. OK TO SHOWER IN 24HRS  General instructions Rest and then return to your normal activities as told by your health care provider. tylenol as needed for discomfort.    Use narcotics, if prescribed, only when tylenol and motrin is not enough to control pain.  325-650mg  every 8hrs to max of 3000mg /24hrs (including the 325mg  in every norco dose) for the tylenol.    Keep all follow-up visits as told by your health care provider. This is important. Contact a health care provider if you have excessive: redness, swelling, or pain around your site. blood coming from your site. pus or a bad smell coming from your site. You have a fever.  Get help right away if: You have bleeding that does not stop with pressure or a dressing. Summary After the procedure, it is common to have some soreness, bruising, and itching at the site. Follow instructions from your health care provider about how to take care of your site. Keep all follow-up visits as told by your health care provider. This is important. This information is not intended to replace advice given to you by your health care provider. Make sure you discuss any questions you have with your health care provider. Document  Released: 09/01/2015 Document Revised: 02/02/2018 Document Reviewed: 02/02/2018 Elsevier Interactive Patient Education  [employer].

## 2023-07-04 NOTE — Transfer of Care (Signed)
Immediate Anesthesia Transfer of Care Note  Patient: Jenny Baldwin  Procedure(s) Performed: HEMORRHOIDECTOMY (Buttocks)  Patient Location: PACU  Anesthesia Type:General  Level of Consciousness: drowsy  Airway & Oxygen Therapy: Patient Spontanous Breathing and Patient connected to face mask oxygen  Post-op Assessment: Report given to RN and Post -op Vital signs reviewed and stable  Post vital signs: Reviewed and stable  Last Vitals:  Vitals Value Taken Time  BP 95/49 07/04/23 0821  Temp 97.5   Pulse 77 07/04/23 0826  Resp 19 07/04/23 0826  SpO2 100 % 07/04/23 0826  Vitals shown include unfiled device data.  Last Pain:  Vitals:   07/04/23 0633  TempSrc: Tympanic  PainSc: 4          Complications: No notable events documented.

## 2023-07-04 NOTE — Anesthesia Preprocedure Evaluation (Addendum)
Anesthesia Evaluation  Patient identified by MRN, date of birth, ID band Patient awake    Reviewed: Allergy & Precautions, NPO status , Patient's Chart, lab work & pertinent test results  History of Anesthesia Complications Negative for: history of anesthetic complications  Airway Mallampati: III  TM Distance: >3 FB Neck ROM: full    Dental  (+) Missing   Pulmonary asthma , sleep apnea , Patient abstained from smoking., former smoker   Pulmonary exam normal        Cardiovascular Exercise Tolerance: Good hypertension, (-) angina (-) Past MI and (-) DOE Normal cardiovascular exam     Neuro/Psych  Headaches, Seizures -, Poorly Controlled,  PSYCHIATRIC DISORDERS         GI/Hepatic Neg liver ROS,GERD  Controlled,,  Endo/Other  negative endocrine ROS    Renal/GU      Musculoskeletal   Abdominal   Peds  Hematology negative hematology ROS (+)   Anesthesia Other Findings Past Medical History: No date: Acid reflux No date: Asthma No date: Bipolar 1 disorder (HCC) No date: Chronic right hip pain No date: Fatty liver disease, nonalcoholic No date: Heart palpitations No date: Hemorrhoids No date: Hyperlipidemia No date: Hypertension No date: Manic depression (HCC) No date: Obesity No date: OCD (obsessive compulsive disorder) No date: OSA on CPAP No date: Pedal edema No date: Prediabetes 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 No date: PTSD (post-traumatic stress disorder) No date: Recurrent UTI     Comment:  with pregnancy No date: SVD (spontaneous vaginal delivery)     Comment:  x 5 No date: Tobacco abuse No date: Vitamin D deficiency  Past Surgical History: 05/08/2012: CESAREAN SECTION     Comment:  Procedure: CESAREAN SECTION;  Surgeon: Brock Bad,              MD;  Location: [facility];  Service: Obstetrics;  Laterality:              N/A;  Primary Cesarean Section Delivery Girl @ 1555,               [facility]  9/9 01/22/2023: COLONOSCOPY; N/A     Comment:  Procedure: COLONOSCOPY;  Surgeon: Toledo, Boykin Nearing, MD;              Location: [facility];  Service: Gastroenterology;                Laterality: N/A; 01/22/2023: ESOPHAGOGASTRODUODENOSCOPY (EGD) WITH PROPOFOL; N/A     Comment:  Procedure: ESOPHAGOGASTRODUODENOSCOPY (EGD) WITH               PROPOFOL;  Surgeon: Toledo, Boykin Nearing, MD;  Location:               [facility];  Service: Gastroenterology;  Laterality:               N/A;  BMI    Body Mass Index: 43.53 kg/m      Reproductive/Obstetrics negative OB ROS                             Anesthesia Physical Anesthesia Plan  ASA: 3  Anesthesia Plan: General ETT   Post-op Pain Management:    Induction: Intravenous  PONV Risk Score and Plan: Dexamethasone, Ondansetron, Midazolam and Treatment may vary due to age or medical condition  Airway Management Planned: Oral ETT  Additional Equipment:   Intra-op Plan:   Post-operative Plan: Extubation in OR  Informed Consent: I have reviewed  the patients History and Physical, chart, labs and discussed the procedure including the risks, benefits and alternatives for the proposed anesthesia with the patient or authorized representative who has indicated his/her understanding and acceptance.     Dental Advisory Given  Plan Discussed with: Anesthesiologist, CRNA and Surgeon  Anesthesia Plan Comments: (Patient consented for risks of anesthesia including but not limited to:  - adverse reactions to medications - damage to eyes, teeth, lips or other oral mucosa - nerve damage due to positioning  - sore throat or hoarseness - Damage to heart, brain, nerves, lungs, other parts of body or loss of life  Patient voiced understanding and assent.)       Anesthesia Quick Evaluation

## 2023-07-05 NOTE — Anesthesia Postprocedure Evaluation (Signed)
Anesthesia Post Note  Patient: Jenny Baldwin  Procedure(s) Performed: HEMORRHOIDECTOMY (Buttocks)  Patient location during evaluation: PACU Anesthesia Type: General Level of consciousness: awake and alert Pain management: pain level controlled Vital Signs Assessment: post-procedure vital signs reviewed and stable Respiratory status: spontaneous breathing, nonlabored ventilation, respiratory function stable and patient connected to nasal cannula oxygen Cardiovascular status: blood pressure returned to baseline and stable Postop Assessment: no apparent nausea or vomiting Anesthetic complications: no   No notable events documented.   Last Vitals:  Vitals:   07/04/23 0845 07/04/23 0912  BP: 102/63 132/74  Pulse: 66 65  Resp: 13 18  Temp:  (!) 36.3 C  SpO2: 100% (!) 89%    Last Pain:  Vitals:   07/04/23 0912  TempSrc: Tympanic  PainSc: 0-No pain                 Cleda Mccreedy Andy Allende

## 2023-07-07 LAB — SURGICAL PATHOLOGY

## 2024-01-08 ENCOUNTER — Other Ambulatory Visit: Payer: Self-pay | Admitting: Family Medicine

## 2024-01-08 DIAGNOSIS — R7989 Other specified abnormal findings of blood chemistry: Secondary | ICD-10-CM

## 2024-01-19 ENCOUNTER — Ambulatory Visit
Admission: RE | Admit: 2024-01-19 | Discharge: 2024-01-19 | Disposition: A | Discharge status: Home / Self Care | Source: Ambulatory Visit | Attending: Family Medicine | Admitting: Family Medicine

## 2024-01-19 DIAGNOSIS — R7989 Other specified abnormal findings of blood chemistry: Secondary | ICD-10-CM | POA: Insufficient documentation

## 2024-08-10 ENCOUNTER — Other Ambulatory Visit: Payer: Self-pay | Admitting: Family Medicine

## 2024-08-10 DIAGNOSIS — Z1231 Encounter for screening mammogram for malignant neoplasm of breast: Secondary | ICD-10-CM
